# Patient Record
Sex: Female | Born: 1965
Health system: Southern US, Community
[De-identification: ages and names within clinical notes are randomized; demographics above are authoritative.]

## PROBLEM LIST (undated history)

## (undated) DIAGNOSIS — Z8619 Personal history of other infectious and parasitic diseases: Secondary | ICD-10-CM

## (undated) DIAGNOSIS — E785 Hyperlipidemia, unspecified: Secondary | ICD-10-CM

## (undated) DIAGNOSIS — Z1502 Genetic susceptibility to malignant neoplasm of ovary: Secondary | ICD-10-CM

## (undated) DIAGNOSIS — I10 Essential (primary) hypertension: Secondary | ICD-10-CM

## (undated) DIAGNOSIS — N915 Oligomenorrhea, unspecified: Secondary | ICD-10-CM

## (undated) DIAGNOSIS — Z1509 Genetic susceptibility to other malignant neoplasm: Secondary | ICD-10-CM

## (undated) DIAGNOSIS — Z87898 Personal history of other specified conditions: Secondary | ICD-10-CM

## (undated) DIAGNOSIS — Z9221 Personal history of antineoplastic chemotherapy: Secondary | ICD-10-CM

## (undated) DIAGNOSIS — O09529 Supervision of elderly multigravida, unspecified trimester: Secondary | ICD-10-CM

## (undated) DIAGNOSIS — N926 Irregular menstruation, unspecified: Secondary | ICD-10-CM

## (undated) DIAGNOSIS — C801 Malignant (primary) neoplasm, unspecified: Secondary | ICD-10-CM

## (undated) DIAGNOSIS — Z923 Personal history of irradiation: Secondary | ICD-10-CM

## (undated) DIAGNOSIS — Z1501 Genetic susceptibility to malignant neoplasm of breast: Secondary | ICD-10-CM

## (undated) DIAGNOSIS — Z800 Family history of malignant neoplasm of digestive organs: Secondary | ICD-10-CM

## (undated) DIAGNOSIS — Z1589 Genetic susceptibility to other disease: Secondary | ICD-10-CM

## (undated) DIAGNOSIS — Z8742 Personal history of other diseases of the female genital tract: Secondary | ICD-10-CM

## (undated) DIAGNOSIS — E611 Iron deficiency: Secondary | ICD-10-CM

## (undated) DIAGNOSIS — C50919 Malignant neoplasm of unspecified site of unspecified female breast: Secondary | ICD-10-CM

## (undated) DIAGNOSIS — N970 Female infertility associated with anovulation: Secondary | ICD-10-CM

## (undated) HISTORY — PX: OTHER SURGICAL HISTORY: SHX169

## (undated) HISTORY — DX: Essential (primary) hypertension: I10

## (undated) HISTORY — DX: Personal history of other infectious and parasitic diseases: Z86.19

## (undated) HISTORY — DX: Personal history of other diseases of the female genital tract: Z87.42

## (undated) HISTORY — PX: EYE SURGERY: SHX253

## (undated) HISTORY — DX: Genetic susceptibility to other disease: Z15.89

## (undated) HISTORY — DX: Genetic susceptibility to malignant neoplasm of breast: Z15.01

## (undated) HISTORY — DX: Irregular menstruation, unspecified: N92.6

## (undated) HISTORY — DX: Supervision of elderly multigravida, unspecified trimester: O09.529

## (undated) HISTORY — DX: Family history of malignant neoplasm of digestive organs: Z80.0

## (undated) HISTORY — PX: BREAST LUMPECTOMY: SHX2

## (undated) HISTORY — DX: Genetic susceptibility to malignant neoplasm of ovary: Z15.02

## (undated) HISTORY — DX: Personal history of other specified conditions: Z87.898

## (undated) HISTORY — PX: BREAST BIOPSY: SHX20

## (undated) HISTORY — DX: Iron deficiency: E61.1

## (undated) HISTORY — DX: Hyperlipidemia, unspecified: E78.5

## (undated) HISTORY — DX: Genetic susceptibility to other malignant neoplasm: Z15.09

## (undated) HISTORY — DX: Female infertility associated with anovulation: N97.0

## (undated) HISTORY — DX: Oligomenorrhea, unspecified: N91.5

---

## 1998-04-28 ENCOUNTER — Other Ambulatory Visit: Admission: RE | Admit: 1998-04-28 | Discharge: 1998-04-28 | Payer: Self-pay | Admitting: Obstetrics and Gynecology

## 1998-06-11 ENCOUNTER — Other Ambulatory Visit: Admission: RE | Admit: 1998-06-11 | Discharge: 1998-06-11 | Payer: Self-pay | Admitting: Obstetrics and Gynecology

## 1999-12-13 HISTORY — PX: WISDOM TOOTH EXTRACTION: SHX21

## 2001-02-07 DIAGNOSIS — N970 Female infertility associated with anovulation: Secondary | ICD-10-CM

## 2001-02-07 HISTORY — DX: Female infertility associated with anovulation: N97.0

## 2001-08-06 ENCOUNTER — Other Ambulatory Visit: Admission: RE | Admit: 2001-08-06 | Discharge: 2001-08-06 | Payer: Self-pay | Admitting: Obstetrics and Gynecology

## 2002-10-01 ENCOUNTER — Other Ambulatory Visit: Admission: RE | Admit: 2002-10-01 | Discharge: 2002-10-01 | Payer: Self-pay | Admitting: Obstetrics and Gynecology

## 2003-10-08 ENCOUNTER — Other Ambulatory Visit: Admission: RE | Admit: 2003-10-08 | Discharge: 2003-10-08 | Payer: Self-pay | Admitting: Obstetrics and Gynecology

## 2003-10-23 ENCOUNTER — Ambulatory Visit (HOSPITAL_COMMUNITY): Admission: RE | Admit: 2003-10-23 | Discharge: 2003-10-23 | Payer: Self-pay | Admitting: Obstetrics and Gynecology

## 2004-12-22 ENCOUNTER — Other Ambulatory Visit: Admission: RE | Admit: 2004-12-22 | Discharge: 2004-12-22 | Payer: Self-pay | Admitting: Obstetrics and Gynecology

## 2005-01-17 ENCOUNTER — Ambulatory Visit (HOSPITAL_COMMUNITY): Admission: RE | Admit: 2005-01-17 | Discharge: 2005-01-17 | Payer: Self-pay | Admitting: Obstetrics and Gynecology

## 2005-03-28 ENCOUNTER — Inpatient Hospital Stay (HOSPITAL_COMMUNITY): Admission: AD | Admit: 2005-03-28 | Discharge: 2005-03-31 | Payer: Self-pay | Admitting: Obstetrics and Gynecology

## 2005-03-28 DIAGNOSIS — O09529 Supervision of elderly multigravida, unspecified trimester: Secondary | ICD-10-CM

## 2005-03-28 HISTORY — DX: Supervision of elderly multigravida, unspecified trimester: O09.529

## 2006-01-10 ENCOUNTER — Other Ambulatory Visit: Admission: RE | Admit: 2006-01-10 | Discharge: 2006-01-10 | Payer: Self-pay | Admitting: Obstetrics and Gynecology

## 2011-02-08 ENCOUNTER — Other Ambulatory Visit (HOSPITAL_COMMUNITY): Payer: Self-pay | Admitting: Obstetrics and Gynecology

## 2011-02-08 DIAGNOSIS — Z1231 Encounter for screening mammogram for malignant neoplasm of breast: Secondary | ICD-10-CM

## 2011-02-17 ENCOUNTER — Ambulatory Visit (HOSPITAL_COMMUNITY)
Admission: RE | Admit: 2011-02-17 | Discharge: 2011-02-17 | Disposition: A | Discharge status: Home / Self Care | Payer: Managed Care, Other (non HMO) | Source: Ambulatory Visit | Attending: Obstetrics and Gynecology | Admitting: Obstetrics and Gynecology

## 2011-02-17 DIAGNOSIS — Z1231 Encounter for screening mammogram for malignant neoplasm of breast: Secondary | ICD-10-CM | POA: Insufficient documentation

## 2012-02-20 ENCOUNTER — Other Ambulatory Visit: Payer: Self-pay | Admitting: Obstetrics and Gynecology

## 2012-02-20 DIAGNOSIS — Z1231 Encounter for screening mammogram for malignant neoplasm of breast: Secondary | ICD-10-CM

## 2012-03-16 ENCOUNTER — Ambulatory Visit (HOSPITAL_COMMUNITY)
Admission: RE | Admit: 2012-03-16 | Discharge: 2012-03-16 | Disposition: A | Discharge status: Home / Self Care | Payer: Managed Care, Other (non HMO) | Source: Ambulatory Visit | Attending: Obstetrics and Gynecology | Admitting: Obstetrics and Gynecology

## 2012-03-16 DIAGNOSIS — Z1231 Encounter for screening mammogram for malignant neoplasm of breast: Secondary | ICD-10-CM | POA: Insufficient documentation

## 2012-03-19 ENCOUNTER — Other Ambulatory Visit: Payer: Self-pay | Admitting: Obstetrics and Gynecology

## 2012-03-19 DIAGNOSIS — R928 Other abnormal and inconclusive findings on diagnostic imaging of breast: Secondary | ICD-10-CM

## 2012-03-21 ENCOUNTER — Ambulatory Visit
Admission: RE | Admit: 2012-03-21 | Discharge: 2012-03-21 | Disposition: A | Discharge status: Home / Self Care | Payer: Managed Care, Other (non HMO) | Source: Ambulatory Visit | Attending: Obstetrics and Gynecology | Admitting: Obstetrics and Gynecology

## 2012-03-21 DIAGNOSIS — R928 Other abnormal and inconclusive findings on diagnostic imaging of breast: Secondary | ICD-10-CM

## 2012-03-29 ENCOUNTER — Ambulatory Visit: Payer: Self-pay | Admitting: Obstetrics and Gynecology

## 2012-04-26 ENCOUNTER — Telehealth: Payer: Self-pay

## 2012-04-26 MED ORDER — NORETHINDRONE 0.35 MG PO TABS: 1.0000 | ORAL_TABLET | Freq: Every day | ORAL | Status: DC

## 2012-04-26 NOTE — Telephone Encounter (Signed)
Rx fax req received for Jennifer Carroll from [facility]([place]). AEX sched 05/08/12 with vph. Rx faxed to pharm.

## 2012-05-08 ENCOUNTER — Ambulatory Visit (INDEPENDENT_AMBULATORY_CARE_PROVIDER_SITE_OTHER): Payer: Managed Care, Other (non HMO) | Admitting: Obstetrics and Gynecology

## 2012-05-08 ENCOUNTER — Encounter: Payer: Self-pay | Admitting: Obstetrics and Gynecology

## 2012-05-08 VITALS — BP 130/88 | Ht 66.5 in | Wt 234.0 lb

## 2012-05-08 DIAGNOSIS — N926 Irregular menstruation, unspecified: Secondary | ICD-10-CM

## 2012-05-08 DIAGNOSIS — Z124 Encounter for screening for malignant neoplasm of cervix: Secondary | ICD-10-CM

## 2012-05-08 DIAGNOSIS — N949 Unspecified condition associated with female genital organs and menstrual cycle: Secondary | ICD-10-CM

## 2012-05-08 LAB — POCT URINE PREGNANCY: Preg Test, Ur: NEGATIVE

## 2012-05-08 MED ORDER — NORETHINDRONE 0.35 MG PO TABS: 1.0000 | ORAL_TABLET | Freq: Every day | ORAL | Status: DC

## 2012-05-08 MED ORDER — NORETHINDRONE 0.35 MG PO TABS: 1.0000 | ORAL_TABLET | Freq: Every day | ORAL | Status: AC

## 2012-05-08 NOTE — Progress Notes (Signed)
Last Pap: 03/29/2011 WNL: Yes Regular Periods:no Contraception: Micronor Monthly Breast exam:yes Tetanus<77yrs:yes 2008 Nl.Bladder Function:yes Daily BMs:yes Healthy Diet:yes Calcium:no Mammogram:yes 02/20/12-?right breast mass with no malignancy found in left breast. 03-19-12-right breast ultrasound=benign. Exercise:yes 3/4 weekly Seatbelt: yes Abuse at home: no Stressful work:no Sigmoid-colonoscopy: n/a Bone Density: No BMI=36 Subjective:    Jennifer Carroll is a 46 y.o. female, No obstetric history on file., who presents for an annual exam.     History   Social History  . Marital Status: Married    Spouse Name: N/A    Number of Children: N/A  . Years of Education: N/A   Social History Main Topics  . Smoking status: Never Smoker   . Smokeless tobacco: Never Used  . Alcohol Use: None  . Drug Use: None  . Sexually Active: Yes   Other Topics Concern  . None   Social History Narrative  . None    Menstrual cycle:   LMP: Patient's last menstrual period was 02/29/2012.           Cycle: Irregular for many years, no menorrhagia or pain  The following portions of the patient's history were reviewed and updated as appropriate: allergies, current medications, past family history, past medical history, past social history, past surgical history and problem list.  Review of Systems Pertinent items are noted in HPI. Breast:Negative for breast lump,nipple discharge or nipple retraction Gastrointestinal: Negative for abdominal pain, change in bowel habits or rectal bleeding Urinary:negative   Objective:    BP 130/88  Ht 5' 6.5" (1.689 m)  Wt 234 lb (106.142 kg)  BMI 37.20 kg/m2  LMP 02/29/2012    Weight:  Wt Readings from Last 1 Encounters:  05/08/12 234 lb (106.142 kg)          BMI: Body mass index is 37.20 kg/(m^2).  General Appearance: Alert, appropriate appearance for age. No acute distress HEENT: Grossly normal Neck / Thyroid: Supple, no masses, nodes or  enlargement Lungs: clear to auscultation bilaterally Back: No CVA tenderness Breast Exam: No masses or nodes.No dimpling, nipple retraction or discharge. Cardiovascular: Regular rate and rhythm. S1, S2, no murmur Gastrointestinal: Soft, non-tender, no masses or organomegaly Pelvic Exam: Vulva and vagina appear normal. Bimanual exam reveals normal uterus and adnexa. Rectovaginal: normal rectal, no masses Lymphatic Exam: Non-palpable nodes in neck, clavicular, axillary, or inguinal regions Skin: no rash or abnormalities Neurologic: Normal gait and speech, no tremor  Psychiatric: Alert and oriented, appropriate affect.   Wet Prep:not applicable Urinalysis:not applicable UPT: Negative,    Assessment:    Long hx irregular menses, probably secondary to oligoovulation     Plan:    mammogram pap smear with HPV return annually or prn Continue Micronor       Korayma Hagwood PMD

## 2012-05-11 LAB — PAP IG AND HPV HIGH-RISK: HPV DNA High Risk: NOT DETECTED

## 2013-05-01 ENCOUNTER — Other Ambulatory Visit: Payer: Self-pay | Admitting: Obstetrics and Gynecology

## 2013-05-01 DIAGNOSIS — Z1231 Encounter for screening mammogram for malignant neoplasm of breast: Secondary | ICD-10-CM

## 2013-05-10 ENCOUNTER — Ambulatory Visit (HOSPITAL_COMMUNITY)
Admission: RE | Admit: 2013-05-10 | Discharge: 2013-05-10 | Disposition: A | Discharge status: Home / Self Care | Payer: Managed Care, Other (non HMO) | Source: Ambulatory Visit | Attending: Obstetrics and Gynecology | Admitting: Obstetrics and Gynecology

## 2013-05-10 DIAGNOSIS — Z1231 Encounter for screening mammogram for malignant neoplasm of breast: Secondary | ICD-10-CM | POA: Insufficient documentation

## 2014-05-26 ENCOUNTER — Other Ambulatory Visit: Payer: Self-pay | Admitting: Obstetrics and Gynecology

## 2014-05-26 DIAGNOSIS — Z1231 Encounter for screening mammogram for malignant neoplasm of breast: Secondary | ICD-10-CM

## 2014-05-29 ENCOUNTER — Ambulatory Visit (HOSPITAL_COMMUNITY)
Admission: RE | Admit: 2014-05-29 | Discharge: 2014-05-29 | Disposition: A | Discharge status: Home / Self Care | Payer: Managed Care, Other (non HMO) | Source: Ambulatory Visit | Attending: Obstetrics and Gynecology | Admitting: Obstetrics and Gynecology

## 2014-05-29 DIAGNOSIS — Z1231 Encounter for screening mammogram for malignant neoplasm of breast: Secondary | ICD-10-CM | POA: Insufficient documentation

## 2015-05-21 ENCOUNTER — Other Ambulatory Visit (HOSPITAL_COMMUNITY): Payer: Self-pay | Admitting: Obstetrics and Gynecology

## 2015-05-21 DIAGNOSIS — Z1231 Encounter for screening mammogram for malignant neoplasm of breast: Secondary | ICD-10-CM

## 2015-06-01 ENCOUNTER — Ambulatory Visit (HOSPITAL_COMMUNITY)
Admission: RE | Admit: 2015-06-01 | Discharge: 2015-06-01 | Disposition: A | Discharge status: Home / Self Care | Payer: BLUE CROSS/BLUE SHIELD | Source: Ambulatory Visit | Attending: Obstetrics and Gynecology | Admitting: Obstetrics and Gynecology

## 2015-06-01 DIAGNOSIS — Z1231 Encounter for screening mammogram for malignant neoplasm of breast: Secondary | ICD-10-CM | POA: Diagnosis not present

## 2015-06-03 ENCOUNTER — Other Ambulatory Visit: Payer: Self-pay | Admitting: Obstetrics and Gynecology

## 2015-06-03 DIAGNOSIS — R928 Other abnormal and inconclusive findings on diagnostic imaging of breast: Secondary | ICD-10-CM

## 2015-06-08 ENCOUNTER — Ambulatory Visit
Admission: RE | Admit: 2015-06-08 | Discharge: 2015-06-08 | Disposition: A | Discharge status: Home / Self Care | Payer: BLUE CROSS/BLUE SHIELD | Source: Ambulatory Visit | Attending: Obstetrics and Gynecology | Admitting: Obstetrics and Gynecology

## 2015-06-08 DIAGNOSIS — R928 Other abnormal and inconclusive findings on diagnostic imaging of breast: Secondary | ICD-10-CM

## 2016-02-13 ENCOUNTER — Emergency Department (HOSPITAL_COMMUNITY)
Admission: EM | Admit: 2016-02-13 | Discharge: 2016-02-13 | Disposition: A | Discharge status: Home / Self Care | Payer: BLUE CROSS/BLUE SHIELD | Attending: Emergency Medicine | Admitting: Emergency Medicine

## 2016-02-13 ENCOUNTER — Encounter (HOSPITAL_COMMUNITY): Payer: Self-pay | Admitting: Emergency Medicine

## 2016-02-13 DIAGNOSIS — Y998 Other external cause status: Secondary | ICD-10-CM | POA: Insufficient documentation

## 2016-02-13 DIAGNOSIS — Y9389 Activity, other specified: Secondary | ICD-10-CM | POA: Insufficient documentation

## 2016-02-13 DIAGNOSIS — Z8742 Personal history of other diseases of the female genital tract: Secondary | ICD-10-CM | POA: Diagnosis not present

## 2016-02-13 DIAGNOSIS — S8992XA Unspecified injury of left lower leg, initial encounter: Secondary | ICD-10-CM | POA: Diagnosis not present

## 2016-02-13 DIAGNOSIS — Z793 Long term (current) use of hormonal contraceptives: Secondary | ICD-10-CM | POA: Insufficient documentation

## 2016-02-13 DIAGNOSIS — E785 Hyperlipidemia, unspecified: Secondary | ICD-10-CM | POA: Diagnosis not present

## 2016-02-13 DIAGNOSIS — Z862 Personal history of diseases of the blood and blood-forming organs and certain disorders involving the immune mechanism: Secondary | ICD-10-CM | POA: Insufficient documentation

## 2016-02-13 DIAGNOSIS — Z8619 Personal history of other infectious and parasitic diseases: Secondary | ICD-10-CM | POA: Insufficient documentation

## 2016-02-13 DIAGNOSIS — Y92410 Unspecified street and highway as the place of occurrence of the external cause: Secondary | ICD-10-CM | POA: Diagnosis not present

## 2016-02-13 MED ORDER — IBUPROFEN 400 MG PO TABS
600.0000 mg | ORAL_TABLET | Freq: Once | ORAL | Status: AC
  Administered 2016-02-13: 600 mg via ORAL
  Filled 2016-02-13: qty 1

## 2016-02-13 MED ORDER — METHOCARBAMOL 500 MG PO TABS
1000.0000 mg | ORAL_TABLET | Freq: Once | ORAL | Status: AC
  Administered 2016-02-13: 1000 mg via ORAL
  Filled 2016-02-13: qty 2

## 2016-02-13 MED ORDER — METHOCARBAMOL 500 MG PO TABS: 500.0000 mg | ORAL_TABLET | Freq: Two times a day (BID) | ORAL | Status: DC
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
## 2016-02-13 NOTE — ED Notes (Signed)
Pt ambulates independently and with steady gait at time of discharge. Discharge instructions and follow up information reviewed with patient. No other questions or concerns voiced at this time. RX x 1 given. 
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
## 2017-12-25 DIAGNOSIS — E669 Obesity, unspecified: Secondary | ICD-10-CM | POA: Diagnosis not present

## 2017-12-25 DIAGNOSIS — I10 Essential (primary) hypertension: Secondary | ICD-10-CM | POA: Diagnosis not present

## 2017-12-25 DIAGNOSIS — E7800 Pure hypercholesterolemia, unspecified: Secondary | ICD-10-CM | POA: Diagnosis not present

## 2018-06-01 DIAGNOSIS — E669 Obesity, unspecified: Secondary | ICD-10-CM | POA: Diagnosis not present

## 2018-06-01 DIAGNOSIS — E7800 Pure hypercholesterolemia, unspecified: Secondary | ICD-10-CM | POA: Diagnosis not present

## 2018-06-01 DIAGNOSIS — I10 Essential (primary) hypertension: Secondary | ICD-10-CM | POA: Diagnosis not present

## 2018-06-01 DIAGNOSIS — Z0000 Encounter for general adult medical examination without abnormal findings: Secondary | ICD-10-CM | POA: Diagnosis not present

## 2018-07-02 DIAGNOSIS — Z3040 Encounter for surveillance of contraceptives, unspecified: Secondary | ICD-10-CM | POA: Diagnosis not present

## 2018-07-02 DIAGNOSIS — Z01419 Encounter for gynecological examination (general) (routine) without abnormal findings: Secondary | ICD-10-CM | POA: Diagnosis not present

## 2018-07-02 DIAGNOSIS — Z6837 Body mass index (BMI) 37.0-37.9, adult: Secondary | ICD-10-CM | POA: Diagnosis not present

## 2018-07-02 DIAGNOSIS — I10 Essential (primary) hypertension: Secondary | ICD-10-CM | POA: Diagnosis not present

## 2018-07-02 DIAGNOSIS — Z1231 Encounter for screening mammogram for malignant neoplasm of breast: Secondary | ICD-10-CM | POA: Diagnosis not present

## 2018-07-05 ENCOUNTER — Other Ambulatory Visit: Payer: Self-pay | Admitting: Obstetrics and Gynecology

## 2018-07-05 DIAGNOSIS — N6001 Solitary cyst of right breast: Secondary | ICD-10-CM

## 2018-07-11 ENCOUNTER — Ambulatory Visit
Admission: RE | Admit: 2018-07-11 | Discharge: 2018-07-11 | Disposition: A | Discharge status: Home / Self Care | Payer: BLUE CROSS/BLUE SHIELD | Source: Ambulatory Visit | Attending: Obstetrics and Gynecology | Admitting: Obstetrics and Gynecology

## 2018-07-11 ENCOUNTER — Other Ambulatory Visit: Payer: Self-pay | Admitting: Obstetrics and Gynecology

## 2018-07-11 DIAGNOSIS — N6310 Unspecified lump in the right breast, unspecified quadrant: Secondary | ICD-10-CM | POA: Diagnosis not present

## 2018-07-11 DIAGNOSIS — R928 Other abnormal and inconclusive findings on diagnostic imaging of breast: Secondary | ICD-10-CM | POA: Diagnosis not present

## 2018-07-11 DIAGNOSIS — N6001 Solitary cyst of right breast: Secondary | ICD-10-CM

## 2018-07-13 ENCOUNTER — Other Ambulatory Visit: Payer: Self-pay | Admitting: Obstetrics and Gynecology

## 2018-07-13 ENCOUNTER — Ambulatory Visit
Admission: RE | Admit: 2018-07-13 | Discharge: 2018-07-13 | Disposition: A | Discharge status: Home / Self Care | Payer: BLUE CROSS/BLUE SHIELD | Source: Ambulatory Visit | Attending: Obstetrics and Gynecology | Admitting: Obstetrics and Gynecology

## 2018-07-13 DIAGNOSIS — C50411 Malignant neoplasm of upper-outer quadrant of right female breast: Secondary | ICD-10-CM | POA: Diagnosis not present

## 2018-07-13 DIAGNOSIS — N6310 Unspecified lump in the right breast, unspecified quadrant: Secondary | ICD-10-CM

## 2018-07-13 DIAGNOSIS — N6311 Unspecified lump in the right breast, upper outer quadrant: Secondary | ICD-10-CM | POA: Diagnosis not present

## 2018-07-16 ENCOUNTER — Ambulatory Visit
Admission: RE | Admit: 2018-07-16 | Discharge: 2018-07-16 | Disposition: A | Discharge status: Home / Self Care | Payer: BLUE CROSS/BLUE SHIELD | Source: Ambulatory Visit | Attending: Obstetrics and Gynecology | Admitting: Obstetrics and Gynecology

## 2018-07-16 DIAGNOSIS — N6310 Unspecified lump in the right breast, unspecified quadrant: Secondary | ICD-10-CM

## 2018-07-17 ENCOUNTER — Telehealth: Payer: Self-pay | Admitting: Hematology and Oncology

## 2018-07-17 NOTE — Telephone Encounter (Signed)
Spoke to patient to confirm afternoon [facility] appointment for 8/14, packet and bag already given to patient

## 2018-07-18 ENCOUNTER — Encounter: Payer: Self-pay | Admitting: *Deleted

## 2018-07-18 DIAGNOSIS — Z171 Estrogen receptor negative status [ER-]: Principal | ICD-10-CM

## 2018-07-18 DIAGNOSIS — C50411 Malignant neoplasm of upper-outer quadrant of right female breast: Secondary | ICD-10-CM

## 2018-07-25 ENCOUNTER — Inpatient Hospital Stay: Payer: BLUE CROSS/BLUE SHIELD

## 2018-07-25 ENCOUNTER — Other Ambulatory Visit: Payer: Self-pay | Admitting: General Surgery

## 2018-07-25 ENCOUNTER — Encounter: Payer: Self-pay | Admitting: Physical Therapy

## 2018-07-25 ENCOUNTER — Ambulatory Visit: Payer: BLUE CROSS/BLUE SHIELD | Attending: General Surgery | Admitting: Physical Therapy

## 2018-07-25 ENCOUNTER — Inpatient Hospital Stay: Payer: BLUE CROSS/BLUE SHIELD | Attending: Hematology and Oncology | Admitting: Hematology and Oncology

## 2018-07-25 ENCOUNTER — Ambulatory Visit
Admission: RE | Admit: 2018-07-25 | Discharge: 2018-07-25 | Disposition: A | Discharge status: Home / Self Care | Payer: BLUE CROSS/BLUE SHIELD | Source: Ambulatory Visit | Attending: Radiation Oncology | Admitting: Radiation Oncology

## 2018-07-25 ENCOUNTER — Other Ambulatory Visit: Payer: Self-pay

## 2018-07-25 ENCOUNTER — Encounter: Payer: Self-pay | Admitting: Hematology and Oncology

## 2018-07-25 DIAGNOSIS — E669 Obesity, unspecified: Secondary | ICD-10-CM | POA: Diagnosis not present

## 2018-07-25 DIAGNOSIS — E785 Hyperlipidemia, unspecified: Secondary | ICD-10-CM | POA: Insufficient documentation

## 2018-07-25 DIAGNOSIS — Z171 Estrogen receptor negative status [ER-]: Principal | ICD-10-CM

## 2018-07-25 DIAGNOSIS — C50411 Malignant neoplasm of upper-outer quadrant of right female breast: Secondary | ICD-10-CM | POA: Insufficient documentation

## 2018-07-25 DIAGNOSIS — R293 Abnormal posture: Secondary | ICD-10-CM | POA: Diagnosis not present

## 2018-07-25 DIAGNOSIS — Z79899 Other long term (current) drug therapy: Secondary | ICD-10-CM | POA: Diagnosis not present

## 2018-07-25 DIAGNOSIS — N915 Oligomenorrhea, unspecified: Secondary | ICD-10-CM | POA: Insufficient documentation

## 2018-07-25 DIAGNOSIS — I10 Essential (primary) hypertension: Secondary | ICD-10-CM | POA: Insufficient documentation

## 2018-07-25 LAB — CBC WITH DIFFERENTIAL (CANCER CENTER ONLY)
BASOS ABS: 0 10*3/uL (ref 0.0–0.1)
Basophils Relative: 1 %
EOS PCT: 2 %
Eosinophils Absolute: 0.1 10*3/uL (ref 0.0–0.5)
HEMATOCRIT: 40.6 % (ref 34.8–46.6)
Hemoglobin: 13.4 g/dL (ref 11.6–15.9)
LYMPHS PCT: 35 %
Lymphs Abs: 1.5 10*3/uL (ref 0.9–3.3)
MCH: 29.5 pg (ref 25.1–34.0)
MCHC: 32.9 g/dL (ref 31.5–36.0)
MCV: 89.6 fL (ref 79.5–101.0)
MONOS PCT: 7 %
Monocytes Absolute: 0.3 10*3/uL (ref 0.1–0.9)
Neutro Abs: 2.3 10*3/uL (ref 1.5–6.5)
Neutrophils Relative %: 55 %
PLATELETS: 207 10*3/uL (ref 145–400)
RBC: 4.53 MIL/uL (ref 3.70–5.45)
RDW: 14.9 % — ABNORMAL HIGH (ref 11.2–14.5)
WBC Count: 4.2 10*3/uL (ref 3.9–10.3)

## 2018-07-25 LAB — CMP (CANCER CENTER ONLY)
ALT: 16 U/L (ref 0–44)
AST: 14 U/L — AB (ref 15–41)
Albumin: 3.9 g/dL (ref 3.5–5.0)
Alkaline Phosphatase: 99 U/L (ref 38–126)
Anion gap: 11 (ref 5–15)
BILIRUBIN TOTAL: 0.3 mg/dL (ref 0.3–1.2)
BUN: 15 mg/dL (ref 6–20)
CHLORIDE: 107 mmol/L (ref 98–111)
CO2: 24 mmol/L (ref 22–32)
Calcium: 9.1 mg/dL (ref 8.9–10.3)
Creatinine: 1.07 mg/dL — ABNORMAL HIGH (ref 0.44–1.00)
GFR, EST NON AFRICAN AMERICAN: 59 mL/min — AB (ref >60)
Glucose, Bld: 112 mg/dL — ABNORMAL HIGH (ref 70–99)
POTASSIUM: 4.3 mmol/L (ref 3.5–5.1)
Sodium: 142 mmol/L (ref 135–145)
TOTAL PROTEIN: 7.5 g/dL (ref 6.5–8.1)

## 2018-07-25 NOTE — Assessment & Plan Note (Signed)
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 After reviewing the pathology in detail, we proceeded to discuss the different treatment options between surgery, radiation, chemotherapy, antiestrogen therapies.  Recommendation: 1.  Breast conserving surgery with sentinel lymph node biopsy 2. adjuvant chemotherapy with dose dense Adriamycin and Cytoxan x4 followed by Taxol weekly x12 3.  Adjuvant radiation therapy  Genetics consultation Port placement Echocardiogram Chemo class  Follow-up after surgery to discuss treatment plan.  Chemo will start 1 month after surgery.

## 2018-07-25 NOTE — Progress Notes (Signed)
Radiation Oncology         [phone]) [phone] ________________________________  Name: Jennifer Carroll        MRN: [id number]  Date of Service: 07/25/2018 DOB: 05/12/1966  SM:OLMB, Nathen May, MD  Fanny Skates, MD     REFERRING PHYSICIAN: Fanny Skates, MD   DIAGNOSIS: The encounter diagnosis was Malignant neoplasm of upper-outer quadrant of right breast in female, estrogen receptor negative ([place]).   HISTORY OF PRESENT ILLNESS: Jennifer Carroll is a 52 y.o. female seen in the multidisciplinary breast clinic for a new diagnosis of right breast cancer. The patient was noted to have a screening detected abnormality in the right breast and on ultrasound in the upper outer quadrant, at 10:00, there was a 1.4 x 1 x 1 cm mass. Her axilla was negative for adenopathy. She underwent a biopsy of the lesion on 07/13/18 revealing a grade 2, invasive ductal carcinoma, triple negative. She comes today to discuss treatment recommendations for her cancer.    PREVIOUS RADIATION THERAPY: No   PAST MEDICAL HISTORY:  Past Medical History:  Diagnosis Date  . AMA (advanced maternal age) multigravida 35+ 03/28/05   with no amniocentesis  . 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 H/O infertility   . H/O menorrhagia   . History of chicken pox   . History of measles, mumps, or rubella   . Hyperlipidemia   . Low iron   . Menses, irregular   . Oligomenorrhea        PAST SURGICAL HISTORY: Past Surgical History:  Procedure Laterality Date  . EYE SURGERY     left eye at age 47  . head surgery     to cover hole in head at age of  41 yrs young  . WISDOM TOOTH EXTRACTION  2001     FAMILY HISTORY:  Family History  Problem Relation Age of Onset  . Hypertension Mother   . Heart disease Father   . Hypertension Brother      SOCIAL HISTORY:  reports that she has never smoked. She has never used smokeless tobacco.   ALLERGIES: Patient has no known allergies.   MEDICATIONS:  Current Outpatient Medications    Medication Sig Dispense Refill  . methocarbamol (ROBAXIN) 500 MG tablet Take 1 tablet (500 mg total) by mouth 2 (two) times daily. 20 tablet 0  . norethindrone (CAMILA) 0.35 MG tablet Take 1 tablet (0.35 mg total) by mouth daily. 3 Package 3  . simvastatin (ZOCOR) 20 MG tablet Take 20 mg by mouth every evening.     No current facility-administered medications for this encounter.      REVIEW OF SYSTEMS: On review of systems, the patient reports that she is doing well overall. She denies any chest pain, shortness of breath, cough, fevers, chills, night sweats, unintended weight changes. She denies any bowel or bladder disturbances, and denies abdominal pain, nausea or vomiting. She denies any new musculoskeletal or joint aches or pains. A complete review of systems is obtained and is otherwise negative.     PHYSICAL EXAM:  Wt Readings from Last 3 Encounters:  07/25/18 237 lb 4.8 oz (107.6 kg)  05/08/12 234 lb (106.1 kg)   Temp Readings from Last 3 Encounters:  07/25/18 98.5 F (36.9 C) (Oral)  02/13/16 98.6 F (37 C) (Oral)   BP Readings from Last 3 Encounters:  07/25/18 (!) 145/94  02/13/16 155/95  05/08/12 130/88   Pulse Readings from Last 3 Encounters:  07/25/18 93  02/13/16 84  In general this is a well appearing caucasian female in no acute distress. She is alert and oriented x4 and appropriate throughout the examination. HEENT reveals that the patient is normocephalic, atraumatic. EOMs are intact.  Skin is intact without any evidence of gross lesions. Cardiopulmonary assessment is negative for acute distress and she exhibits normal effort. Breast exam is deferred.  ECOG = 0  0 - Asymptomatic (Fully active, able to carry on all predisease activities without restriction)  1 - Symptomatic but completely ambulatory (Restricted in physically strenuous activity but ambulatory and able to carry out work of a light or sedentary nature. For example, light housework, office  work)  2 - Symptomatic, <50% in bed during the day (Ambulatory and capable of all self care but unable to carry out any work activities. Up and about more than 50% of waking hours)  3 - Symptomatic, >50% in bed, but not bedbound (Capable of only limited self-care, confined to bed or chair 50% or more of waking hours)  4 - Bedbound (Completely disabled. Cannot carry on any self-care. Totally confined to bed or chair)  5 - Death   Eustace Pen MM, Creech RH, Tormey DC, et al. [id number]). "Toxicity and response criteria of the [facility]". [place] Oncol. 5 (6): 649-55    LABORATORY DATA:  Lab Results  Component Value Date   WBC 4.2 07/25/2018   HGB 13.4 07/25/2018   HCT 40.6 07/25/2018   MCV 89.6 07/25/2018   PLT 207 07/25/2018   Lab Results  Component Value Date   NA 142 07/25/2018   K 4.3 07/25/2018   CL 107 07/25/2018   CO2 24 07/25/2018   Lab Results  Component Value Date   ALT 16 07/25/2018   AST 14 (L) 07/25/2018   ALKPHOS 99 07/25/2018   BILITOT 0.3 07/25/2018      RADIOGRAPHY: US Breast Ltd Uni Right Inc Axilla  Result Date: 07/11/2018 CLINICAL DATA:  Recall from screening mammography with tomosynthesis, possible developing asymmetry in the UPPER OUTER RIGHT breast at far POSTERIOR depth. EXAM: DIGITAL DIAGNOSTIC RIGHT MAMMOGRAM WITH TOMO ULTRASOUND RIGHT BREAST COMPARISON:  Mammography 07/02/2018, 06/12/2017 and earlier from [facility]. ACR Breast Density Category b: There are scattered areas of fibroglandular density. FINDINGS: The focal asymmetry in the UPPER OUTER QUADRANT of the RIGHT breast at POSTERIOR depth persists on the spot compression views, and there is evidence of associated architectural distortion, especially on the CC tomosynthesis images. There are no associated suspicious calcifications. On physical exam, the tissues of the UPPER OUTER QUADRANT of the RIGHT breast have a "lumpy bumpy" texture, though I do not palpate a  discrete mass. Targeted RIGHT breast ultrasound is performed, showing a hypoechoic antiparallel mass with indistinct and irregular margins at the 10 o'clock position approximately 20 cm from the nipple measuring approximately 1.0 x 1.0 x 1.4 cm, demonstrating slight posterior acoustic shadowing and no internal power Doppler flow, corresponding to the screening mammographic finding. Sonographic evaluation of the RIGHT axilla demonstrates no pathologic lymphadenopathy. IMPRESSION: 1. Highly suspicious approximate 1.4 cm mass involving the UPPER OUTER QUADRANT of the RIGHT breast at POSTERIOR depth at the 10 o'clock position approximately 20 cm from the nipple. 2. No pathologic RIGHT axillary lymphadenopathy. RECOMMENDATION: Ultrasound-guided core needle biopsy of the RIGHT breast mass. The ultrasound core needle biopsy procedure was discussed with patient and her questions were answered. She has agreed to proceed and the biopsy has been scheduled for this Friday, August 2 at 3:45  p.m. I have discussed the findings and recommendations with the patient. Results were also provided in writing at the conclusion of the visit. BI-RADS CATEGORY  5: Highly suggestive of malignancy. Electronically Signed   By: Evangeline Dakin M.D.   On: 07/11/2018 13:49   Mm Diag Breast Tomo Uni Right  Result Date: 07/11/2018 CLINICAL DATA:  Recall from screening mammography with tomosynthesis, possible developing asymmetry in the UPPER OUTER RIGHT breast at far POSTERIOR depth. EXAM: DIGITAL DIAGNOSTIC RIGHT MAMMOGRAM WITH TOMO ULTRASOUND RIGHT BREAST COMPARISON:  Mammography 07/02/2018, 06/12/2017 and earlier from [facility]. ACR Breast Density Category b: There are scattered areas of fibroglandular density. FINDINGS: The focal asymmetry in the UPPER OUTER QUADRANT of the RIGHT breast at POSTERIOR depth persists on the spot compression views, and there is evidence of associated architectural distortion, especially on the CC  tomosynthesis images. There are no associated suspicious calcifications. On physical exam, the tissues of the UPPER OUTER QUADRANT of the RIGHT breast have a "lumpy bumpy" texture, though I do not palpate a discrete mass. Targeted RIGHT breast ultrasound is performed, showing a hypoechoic antiparallel mass with indistinct and irregular margins at the 10 o'clock position approximately 20 cm from the nipple measuring approximately 1.0 x 1.0 x 1.4 cm, demonstrating slight posterior acoustic shadowing and no internal power Doppler flow, corresponding to the screening mammographic finding. Sonographic evaluation of the RIGHT axilla demonstrates no pathologic lymphadenopathy. IMPRESSION: 1. Highly suspicious approximate 1.4 cm mass involving the UPPER OUTER QUADRANT of the RIGHT breast at POSTERIOR depth at the 10 o'clock position approximately 20 cm from the nipple. 2. No pathologic RIGHT axillary lymphadenopathy. RECOMMENDATION: Ultrasound-guided core needle biopsy of the RIGHT breast mass. The ultrasound core needle biopsy procedure was discussed with patient and her questions were answered. She has agreed to proceed and the biopsy has been scheduled for this Friday, August 2 at 3:45 p.m. I have discussed the findings and recommendations with the patient. Results were also provided in writing at the conclusion of the visit. BI-RADS CATEGORY  5: Highly suggestive of malignancy. Electronically Signed   By: Evangeline Dakin M.D.   On: 07/11/2018 13:49   Mm Clip Placement Right  Result Date: 07/13/2018 CLINICAL DATA:  Status post ultrasound-guided core biopsy of the right breast. EXAM: DIAGNOSTIC RIGHT MAMMOGRAM POST ULTRASOUND BIOPSY COMPARISON:  Previous exam(s). FINDINGS: Mammographic images were obtained following ultrasound guided biopsy of the right breast. Mammographic images show there is a ribbon shaped clip in appropriate position in the upper-outer quadrant of the right breast. IMPRESSION: Status post  ultrasound-guided core biopsy of the right breast with pathology pending. Final Assessment: Post Procedure Mammograms for Marker Placement Electronically Signed   By: Lillia Mountain M.D.   On: 07/13/2018 16:29   [place] Rt Breast Bx W Loc Dev 1st Lesion Img Bx Spec US Guide  Addendum Date: 07/18/2018   ADDENDUM REPORT: 07/17/2018 11:18 ADDENDUM: Pathology revealed GRADE II INVASIVE MAMMARY CARCINOMA of RIGHT breast, 10 o'clock. This was found to be concordant by Dr. Lillia Mountain. Pathology results were discussed in person with the patient on July 16, 2018. The patient reported doing well after the biopsy with tenderness at the site. Post biopsy instructions and care were reviewed and questions were answered. The patient was encouraged to call [facility] for any additional concerns. The patient was referred to [facility] at [facility] on July 25, 2018. I have spoken to the office  of Dr [name], [place], [place], [place], and the office of Lennie Odor, PA-C, [place] [facility] [place], [place], [place], to inform of biopsy results. Pathology reports have been faxed to the office of Dr [name] and [facility] [place]. Pathology results reported by Roselind Messier, RN on 07/17/2018. Electronically Signed   By: Lillia Mountain M.D.   On: 07/17/2018 11:18   Result Date: 07/18/2018 CLINICAL DATA:  Right breast mass. EXAM: ULTRASOUND GUIDED RIGHT BREAST CORE NEEDLE BIOPSY COMPARISON:  Previous exam(s). FINDINGS: I met with the patient and we discussed the procedure of ultrasound-guided biopsy, including benefits and alternatives. We discussed the high likelihood of a successful procedure. We discussed the risks of the procedure, including infection, bleeding, tissue injury, clip migration, and inadequate sampling. Informed written consent was given. The usual time-out protocol was performed immediately prior to the  procedure. Lesion quadrant: Upper outer quadrant. Using sterile technique and 1% lidocaine and 1% lidocaine with epinephrine as local anesthetic, under direct ultrasound visualization, a 14 gauge spring-loaded device was used to perform biopsy of a mass in the 10 o'clock region of the right breast using an inferior to superior approach. At the conclusion of the procedure a ribbon shaped tissue marker clip was deployed into the biopsy cavity. Follow up 2 view mammogram was performed and dictated separately. IMPRESSION: Ultrasound guided biopsy of the right breast. No apparent complications. Electronically Signed: By: Lillia Mountain M.D. On: 07/13/2018 16:18       IMPRESSION/PLAN: 1. Stage IB, [id number] grade 2, triple negataive invasive ductal carcinoma of the right breast. Dr. [name] discusses the pathology findings and reviews the nature of invasive breast disease. The consensus from the breast conference includes breast conservation with lumpectomy with sentinel node biopsy and port a cath placement.The patient will need chemotherapy following surgery and will benefit from adjuvant radiotherapy. She will meet with genetics and if she has a predisposition to malignancy, this could lead to consideration of mastectomy. There are several scenarios where postmastectomy radiotherapy may benefit the patient, though we will follow up with her surgical decision making and pathology when available.  We discussed the risks, benefits, short, and long term effects of radiotherapy, and the patient is interested in proceeding. Dr. [name] discusses the delivery and logistics of radiotherapy and anticipates a course of 6 1/2 weeks of radiotherapy. We will see her back about 2-3 weeks after completion of chemotherapy to discuss adjuvant treatment.  2. Possible genetic predisposition to malignancy. The patient is a candidate for genetic testing given her personal history of triple negative disease. She  was offered referral and will  be set up for this urgently.  The above documentation reflects my direct findings during this shared patient visit. Please see the separate note by Dr. [name] on this date for the remainder of the patient's plan of care.    Carola Rhine, PAC

## 2018-07-25 NOTE — Therapy (Addendum)
[place], [place], [address] Phone: [phone]   Fax:  [phone]  Physical Therapy Evaluation  Patient Details  Name: Jennifer Carroll MRN: [id number] Date of Birth: 29-Dec-1965 Referring Provider: Dr. Fanny Skates   Encounter Date: 07/25/2018  PT End of Session - 07/25/18 1448    Visit Number  1    Number of Visits  2    Date for PT Re-Evaluation  09/19/18    PT Start Time  7353    PT Stop Time  1435    PT Time Calculation (min)  28 min    Activity Tolerance  Patient tolerated treatment well    Behavior During Therapy  [facility] for tasks assessed/performed       Past Medical History:  Diagnosis Date  . AMA (advanced maternal age) multigravida 35+ 03/28/05   with no amniocentesis  . 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 H/O infertility   . H/O menorrhagia   . History of chicken pox   . History of measles, mumps, or rubella   . Hyperlipidemia   . Hypertension   . Low iron   . Menses, irregular   . Oligomenorrhea     Past Surgical History:  Procedure Laterality Date  . EYE SURGERY     left eye at age 52  . head surgery     to cover hole in head at age of  52 yrs young  . WISDOM TOOTH EXTRACTION  2001    There were no vitals filed for this visit.   Subjective Assessment - 07/25/18 1435    Subjective  Patient reports she is here today to be seen by her medical team for her newly diagnosed right breast cancer.    Patient is accompained by:  Family member    Pertinent History  Patient was diagnosed on 07/02/18 with right triple negative grade II invasive ductal carcinoma breast cancer. It measures 1.4 cm and is located in the upper outer quadrant. The Ki67 is 50%.    Patient Stated Goals  Reduce lymphedema risk and learn post op shoulder ROM HEP    Currently in Pain?  No/denies         [facility] PT Assessment - 07/25/18 0001      Assessment   Medical Diagnosis  Right breast cancer    Referring Provider  Dr. Fanny Skates    Onset Date/Surgical Date  07/02/18    Hand Dominance  Right    Prior Therapy  none      Precautions   Precautions  Other (comment)    Precaution Comments  active cancer      Restrictions   Weight Bearing Restrictions  No      Balance Screen   Has the patient fallen in the past 6 months  No    Has the patient had a decrease in activity level because of a fear of falling?   No    Is the patient reluctant to leave their home because of a fear of falling?   No      Home Environment   Living Environment  Private residence    Living Arrangements  Spouse/significant other;Children   Husband, 75 and 68 y.o. 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 [employer]  Leisure  She walks once a week for 30 min      Cognition   Overall Cognitive Status  Within Functional Limits for tasks assessed      Posture/Postural Control   Posture/Postural Control  Postural limitations    Postural Limitations  Rounded Shoulders;Forward head      ROM / Strength   AROM / PROM / Strength  AROM;Strength      AROM   AROM Assessment Site  Shoulder;Cervical    Right/Left Shoulder  Right;Left    Right Shoulder Extension  54 Degrees    Right Shoulder Flexion  148 Degrees    Right Shoulder ABduction  149 Degrees    Right Shoulder Internal Rotation  82 Degrees    Right Shoulder External Rotation  70 Degrees    Left Shoulder Extension  58 Degrees    Left Shoulder Flexion  143 Degrees    Left Shoulder ABduction  145 Degrees    Left Shoulder Internal Rotation  82 Degrees    Left Shoulder External Rotation  80 Degrees    Cervical Flexion  WNL    Cervical Extension  WNL    Cervical - Right Side Bend  WNL    Cervical - Left Side Bend  WNL    Cervical - Right Rotation  WNL    Cervical - Left Rotation  WNL       Strength   Overall Strength  Within functional limits for tasks performed        LYMPHEDEMA/ONCOLOGY QUESTIONNAIRE - 07/25/18 1444      Type   Cancer Type  Right breast cancer      Lymphedema Assessments   Lymphedema Assessments  Upper extremities      Right Upper Extremity Lymphedema   10 cm Proximal to Olecranon Process  32.8 cm    Olecranon Process  27.5 cm    10 cm Proximal to Ulnar Styloid Process  24.8 cm    Just Proximal to Ulnar Styloid Process  18 cm    Across Hand at [employer]  20 cm    At [place] of 2nd Digit  6.6 cm      Left Upper Extremity Lymphedema   10 cm Proximal to Olecranon Process  33.6 cm    Olecranon Process  27.5 cm    10 cm Proximal to Ulnar Styloid Process  25 cm    Just Proximal to Ulnar Styloid Process  18.2 cm    Across Hand at [employer]  20.7 cm    At [place] of 2nd Digit  6.5 cm             Objective measurements completed on examination: See above findings.    Patient was instructed today in a home exercise program today for post op shoulder range of motion. These included active assist shoulder flexion in sitting, scapular retraction, wall walking with shoulder abduction, and hands behind head external rotation.  She was encouraged to do these twice a day, holding 3 seconds and repeating 5 times when permitted by her physician.       PT Education - 07/25/18 1447    Education Details  Lymphedema risk reduction and post op shoulder ROM HEP    Person(s) Educated  Patient;Spouse    Methods  Explanation;Demonstration;Handout    Comprehension  Verbalized understanding;Returned demonstration          PT Long Term Goals - 07/25/18 1516      PT LONG TERM GOAL #1  Title  Patient will demonstrate she has returned to baseline post operatively related ot shoulder ROM and function.    Time  [facility] Goals - 07/25/18 1454      Patient will be able to verbalize understanding of  pertinent lymphedema risk reduction practices relevant to her diagnosis specifically related to skin care.   Time  1    Period  Days    Status  Achieved      Patient will be able to return demonstrate and/or verbalize understanding of the post-op home exercise program related to regaining shoulder range of motion.   Time  1    Period  Days    Status  Achieved      Patient will be able to verbalize understanding of the importance of attending the postoperative After Breast Cancer Class for further lymphedema risk reduction education and therapeutic exercise.   Time  1    Period  Days    Status  Achieved            Plan - 07/25/18 1449    Clinical Impression Statement  Patient was diagnosed on 07/02/18 with right triple negative grade II invasive ductal carcinoma breast cancer. It measures 1.4 cm and is located in the upper outer quadrant. The Ki67 is 50%. Her multidisciplinary medical team met prior to her assessments to determine a recommended treatment plan. She is planning to have a right lumpectomy and sentinel node biopsy followed by chemotherapy and radiation. She will benefit from a post op reassessment to determine PT needs.    History and Personal Factors relevant to plan of care:  None    Clinical Presentation  Stable    Clinical Decision Making  Low    Rehab Potential  Excellent    Clinical Impairments Affecting Rehab Potential  None    PT Frequency  --   Eval and 1 f/u visit   PT Treatment/Interventions  ADLs/Self Care Home Management;Patient/family education;Therapeutic exercise    PT Next Visit Plan  Will reassess 3-4 weeks post op    PT Home Exercise Plan  Post op shoulder ROM HEP    Consulted and Agree with Plan of Care  Patient;Family member/caregiver    Family Member Consulted  Spouse, sister-in-law, daughter       Patient will benefit from skilled therapeutic intervention in order to improve the following deficits and impairments:  Decreased range of motion,  Postural dysfunction, Decreased knowledge of precautions, Pain, Impaired UE functional use  Visit Diagnosis: Malignant neoplasm of upper-outer quadrant of right breast in female, estrogen receptor negative ([place]) - Plan: PT plan of care cert/re-cert  Abnormal posture - Plan: PT plan of care cert/re-cert   Patient will follow up at outpatient cancer rehab 3-4 weeks following surgery.  If the patient requires physical therapy at that time, a specific plan will be dictated and sent to the referring physician for approval. The patient was educated today on appropriate basic range of motion exercises to begin post operatively and the importance of attending the After Breast Cancer class following surgery.  Patient was educated today on lymphedema risk reduction practices as it pertains to recommendations that will benefit the patient immediately following surgery.  She verbalized good understanding.      Problem List Patient Active Problem List   Diagnosis Date Noted  . Malignant neoplasm of upper-outer quadrant of right breast in female,  estrogen receptor negative ([place]) 07/18/2018  . Irregular menses 05/08/2012   Annia Friendly, PT 07/25/18 3:17 PM  [place], [place], [address] Phone: [phone]   Fax:  [phone]  Name: Melonie Germani MRN: [id number] Date of Birth: 03-11-1966

## 2018-07-25 NOTE — Patient Instructions (Signed)

## 2018-07-25 NOTE — Progress Notes (Signed)
[facility] NOTE  Patient Care Team: [name], MD as PCP - General (Family Medicine) Fanny Skates, MD as Consulting Physician (General Surgery) Nicholas Lose, MD as Consulting Physician (Hematology and Oncology) Kyung Rudd, MD as Consulting Physician (Radiation Oncology)  CHIEF COMPLAINTS/PURPOSE OF CONSULTATION:  Newly diagnosed breast cancer  HISTORY OF PRESENTING ILLNESS:  Jennifer Carroll 52 y.o. female is here because of recent diagnosis of right breast cancer.  Patient had a routine screening mammogram that detected right breast asymmetry in the upper outer quadrant 10 o'clock position 1.4 cm in size axilla was negative.  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  She was presented this morning to the multidisciplinary tumor board and she is here today accompanied by her family to discuss her treatment plan.  I reviewed her records extensively and collaborated the history with the patient.  SUMMARY OF ONCOLOGIC HISTORY:   Malignant neoplasm of upper-outer quadrant of right breast in female, estrogen receptor negative ([place])   07/13/2018 Initial Diagnosis    Screening detected right breast asymmetry UOQ 10 o'clock position 1.4 cm, axilla negative, biopsy revealed grade 2 IDC triple negative with a Ki-67 of 50% T1c N0 stage Ib AJCC 8    MEDICAL HISTORY:  Past Medical History:  Diagnosis Date  . AMA (advanced maternal age) multigravida 35+ 03/28/05   with no amniocentesis  . 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 H/O infertility   . H/O menorrhagia   . History of chicken pox   . History of measles, mumps, or rubella   . Hyperlipidemia   . Hypertension   . Low iron   . Menses, irregular   . Oligomenorrhea     SURGICAL HISTORY: Past Surgical History:  Procedure Laterality Date  . EYE SURGERY     left eye at age 73  . head surgery     to cover hole in head at age of  17 yrs young  .  WISDOM TOOTH EXTRACTION  2001    SOCIAL HISTORY: Social History   Socioeconomic History  . Marital status: Married    Spouse name: Not on file  . Number of children: Not on file  . Years of education: Not on file  . Highest education level: Not on file  Occupational History  . Not on file  Social Needs  . Financial resource strain: Not on file  . Food insecurity:    Worry: Not on file    Inability: Not on file  . Transportation needs:    Medical: Not on file    Non-medical: Not on file  Tobacco Use  . Smoking status: Never Smoker  . Smokeless tobacco: Never Used  Substance and Sexual Activity  . Alcohol use: Not Currently  . Drug use: Never  . Sexual activity: Yes  Lifestyle  . Physical activity:    Days per week: Not on file    Minutes per session: Not on file  . Stress: Not on file  Relationships  . Social connections:    Talks on phone: Not on file    Gets together: Not on file    Attends religious service: Not on file    Active member of club or organization: Not on file    Attends meetings of clubs or organizations: Not on file    Relationship status: Not on file  . Intimate partner violence:    Fear of current or ex partner:  Not on file    Emotionally abused: Not on file    Physically abused: Not on file    Forced sexual activity: Not on file  Other Topics Concern  . Not on file  Social History Narrative  . Not on file    FAMILY HISTORY: Family History  Problem Relation Age of Onset  . Hypertension Mother   . Heart disease Father   . Hypertension Brother     ALLERGIES:  has No Known Allergies.  MEDICATIONS:  Current Outpatient Medications  Medication Sig Dispense Refill  . ibuprofen (ADVIL,MOTRIN) 200 MG tablet Take 200 mg by mouth every 6 (six) hours as needed. Ibuprofen 200 mg 2 tablets As needed.    Marland Kitchen LOSARTAN POTASSIUM PO Take 50 mg by mouth daily.    . Multiple Vitamin (MULTI-VITAMIN DAILY PO) Take by mouth.    . norethindrone (CAMILA)  0.35 MG tablet Take 1 tablet (0.35 mg total) by mouth daily. 3 Package 3  . simvastatin (ZOCOR) 20 MG tablet Take 20 mg by mouth every evening.     No current facility-administered medications for this visit.     REVIEW OF SYSTEMS:   Constitutional: Denies fevers, chills or abnormal night sweats Eyes: Denies blurriness of vision, double vision or watery eyes Ears, nose, mouth, throat, and face: Denies mucositis or sore throat Respiratory: Denies cough, dyspnea or wheezes Cardiovascular: Denies palpitation, chest discomfort or lower extremity swelling Gastrointestinal:  Denies nausea, heartburn or change in bowel habits Skin: Denies abnormal skin rashes Lymphatics: Denies new lymphadenopathy or easy bruising Neurological:Denies numbness, tingling or new weaknesses Behavioral/Psych: Mood is stable, no new changes  Breast:  Denies any palpable lumps or discharge All other systems were reviewed with the patient and are negative.  PHYSICAL EXAMINATION: ECOG PERFORMANCE STATUS: 0 - Asymptomatic  Vitals:   07/25/18 1248  BP: (!) 145/94  Pulse: 93  Resp: 18  Temp: 98.5 F (36.9 C)  SpO2: 97%   Filed Weights   07/25/18 1248  Weight: 237 lb 4.8 oz (107.6 kg)    GENERAL:alert, no distress and comfortable SKIN: skin color, texture, turgor are normal, no rashes or significant lesions EYES: normal, conjunctiva are pink and non-injected, sclera clear OROPHARYNX:no exudate, no erythema and lips, buccal mucosa, and tongue normal  NECK: supple, thyroid normal size, non-tender, without nodularity LYMPH:  no palpable lymphadenopathy in the cervical, axillary or inguinal LUNGS: clear to auscultation and percussion with normal breathing effort HEART: regular rate & rhythm and no murmurs and no lower extremity edema ABDOMEN:abdomen soft, non-tender and normal bowel sounds Musculoskeletal:no cyanosis of digits and no clubbing  PSYCH: alert & oriented x 3 with fluent speech NEURO: no focal  motor/sensory deficits BREAST: No palpable nodules in breast. No palpable axillary or supraclavicular lymphadenopathy (exam performed in the presence of a chaperone)   LABORATORY DATA:  I have reviewed the data as listed Lab Results  Component Value Date   WBC 4.2 07/25/2018   HGB 13.4 07/25/2018   HCT 40.6 07/25/2018   MCV 89.6 07/25/2018   PLT 207 07/25/2018   Lab Results  Component Value Date   NA 142 07/25/2018   K 4.3 07/25/2018   CL 107 07/25/2018   CO2 24 07/25/2018    RADIOGRAPHIC STUDIES: I have personally reviewed the radiological reports and agreed with the findings in the report.  ASSESSMENT AND PLAN:  Malignant neoplasm of upper-outer quadrant of right breast in female, estrogen receptor negative ([place]) 07/13/2018:Screening detected right breast asymmetry UOQ 10  o'clock position 1.4 cm, axilla negative, biopsy revealed grade 2 IDC triple negative with a Ki-67 of 50% T1c N0 stage Ib AJCC 8 Patient works at [employer] and can work from home if necessary.  Pathology and radiology counseling: Discussed with the patient, the details of pathology including the type of breast cancer,the clinical staging, the significance of ER, PR and HER-2/neu receptors and the implications for treatment. After reviewing the pathology in detail, we proceeded to discuss the different treatment options between surgery, radiation, chemotherapy, antiestrogen therapies.  Recommendation: 1.  Breast conserving surgery with sentinel lymph node biopsy 2. adjuvant chemotherapy with dose dense Adriamycin and Cytoxan x4 followed by Taxol weekly x12 3.  Adjuvant radiation therapy  Genetics consultation Port placement Echocardiogram Chemo class  Follow-up after surgery to discuss treatment plan.  Chemo will start 1 month after surgery.   All questions were answered. The patient knows to call the clinic with any problems, questions or concerns.    Harriette Ohara, MD 07/25/18

## 2018-07-25 NOTE — Progress Notes (Signed)
Nutrition Assessment  Reason for Assessment:  Pt seen in Breast Clinic  ASSESSMENT:   52 year old female with new diagnosis of breast cancer.  Past medical history of HTN.   Patient reports normal appetite  Medications:  reviewed  Labs: reviewed  Anthropometrics:   Height: 66.5 inches Weight: 237 lb BMI: 37   NUTRITION DIAGNOSIS: Food and nutrition related knowledge deficit related to new diagnosis of breast cancer as evidenced by no prior need for nutrition related information.  INTERVENTION:   Discussed and provided packet of information regarding nutritional tips for breast cancer patients.  Questions answered.  Teachback method used.  Contact information provided and patient knows to contact me with questions/concerns.    MONITORING, EVALUATION, and GOAL: Pt will consume a healthy plant based diet to maintain lean body mass throughout treatment.   Vernona Peake B. Zenia Resides, [place], [place] Registered Dietitian [phone] (pager)

## 2018-07-26 ENCOUNTER — Encounter: Payer: Self-pay | Admitting: Genetics

## 2018-07-26 ENCOUNTER — Inpatient Hospital Stay (HOSPITAL_BASED_OUTPATIENT_CLINIC_OR_DEPARTMENT_OTHER): Payer: BLUE CROSS/BLUE SHIELD | Admitting: Genetics

## 2018-07-26 DIAGNOSIS — C50411 Malignant neoplasm of upper-outer quadrant of right female breast: Secondary | ICD-10-CM

## 2018-07-26 DIAGNOSIS — Z79899 Other long term (current) drug therapy: Secondary | ICD-10-CM

## 2018-07-26 DIAGNOSIS — I10 Essential (primary) hypertension: Secondary | ICD-10-CM

## 2018-07-26 DIAGNOSIS — Z171 Estrogen receptor negative status [ER-]: Secondary | ICD-10-CM

## 2018-07-26 DIAGNOSIS — Z800 Family history of malignant neoplasm of digestive organs: Secondary | ICD-10-CM | POA: Insufficient documentation

## 2018-07-26 DIAGNOSIS — E785 Hyperlipidemia, unspecified: Secondary | ICD-10-CM

## 2018-07-26 DIAGNOSIS — N915 Oligomenorrhea, unspecified: Secondary | ICD-10-CM

## 2018-07-26 NOTE — Progress Notes (Signed)
REFERRING PROVIDER: Nicholas Lose, MD [address] [place], [place] [address]  PRIMARY PROVIDER:  [name], MD  PRIMARY REASON FOR VISIT:  1. Malignant neoplasm of upper-outer quadrant of right breast in female, estrogen receptor negative ([place])   2. Family history of pancreatic cancer    HISTORY OF PRESENT ILLNESS:   Jennifer Carroll, a 52 y.o. female, was seen for a [facility] cancer genetics consultation at the request of Dr. [name] due to a personal and family history of cancer.  Jennifer Carroll presents to clinic today to discuss the possibility of a hereditary predisposition to cancer, genetic testing, and to further clarify her future cancer risks, as well as potential cancer risks for family members.   In Aug 2019, at the age of 65, Jennifer Carroll was diagnosed with triple negative invasive ductal carcinoma  of the right breast.  She is currently planning to have breast conserving surgery followed by adjuvant chemotherapy and adjuvant radiation.  CANCER HISTORY:    Malignant neoplasm of upper-outer quadrant of right breast in female, estrogen receptor negative ([place])   07/13/2018 Initial Diagnosis    Screening detected right breast asymmetry UOQ 10 o'clock position 1.4 cm, axilla negative, biopsy revealed grade 2 IDC triple negative with a Ki-67 of 50% T1c N0 stage Ib AJCC 8     HORMONAL RISK FACTORS:  First live birth at age 17.  Ovaries intact: yes.  Hysterectomy: no.  Colonoscopy: no; not examined.  Past Medical History:  Diagnosis Date  . AMA (advanced maternal age) multigravida 35+ 03/28/05   with no amniocentesis  . 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 Family history of pancreatic cancer   . H/O infertility   . H/O menorrhagia   . History of chicken pox   . History of measles, mumps, or rubella   . Hyperlipidemia   . Hypertension   . Low iron   . Menses, irregular   . Oligomenorrhea     Past Surgical History:  Procedure Laterality Date  . EYE SURGERY      left eye at age 53  . head surgery     to cover hole in head at age of  70 yrs young  . WISDOM TOOTH EXTRACTION  2001    Social History   Socioeconomic History  . Marital status: Married    Spouse name: Not on file  . Number of children: Not on file  . Years of education: Not on file  . Highest education level: Not on file  Occupational History  . Not on file  Social Needs  . Financial resource strain: Not on file  . Food insecurity:    Worry: Not on file    Inability: Not on file  . Transportation needs:    Medical: Not on file    Non-medical: Not on file  Tobacco Use  . Smoking status: Never Smoker  . Smokeless tobacco: Never Used  Substance and Sexual Activity  . Alcohol use: Not Currently  . Drug use: Never  . Sexual activity: Yes  Lifestyle  . Physical activity:    Days per week: Not on file    Minutes per session: Not on file  . Stress: Not on file  Relationships  . Social connections:    Talks on phone: Not on file    Gets together: Not on file    Attends religious service: Not on file    Active member of club or organization: Not on file  Attends meetings of clubs or organizations: Not on file    Relationship status: Not on file  Other Topics Concern  . Not on file  Social History Narrative  . Not on file     FAMILY HISTORY:  We obtained a detailed, 4-generation family history.  Significant diagnoses are listed below: Family History  Problem Relation Age of Onset  . Hypertension Mother   . Kidney disease Mother   . Heart disease Father   . Hypertension Brother   . Kidney failure Maternal Grandfather   . Pancreatic cancer Cousin        dx >50    Ms.  Harlan has a 69 year-old daughter and a 33 year-old son with no history of cancer. Ms. Braatz has 3 sisters who died shortly after birth or as a todler.  Ms. Badilla has a brother who is 26 with no history of cancer.    Ms. Peffley father: died at 18, no history of cancer Paternal  aunts/Uncles: 1 paternal aunt died in her 46's with no history of cancer.  Paternal cousins: no history of cancer Paternal grandfather: unk, died before patient was born Paternal grandmother:died in her 3's/90's cause unk  Ms. Mooneyhan's mother: died at 40 due to kidney failure.  She had a hysterectomy in her lifetime.  Maternal Aunts/Uncles: 6 maternal aunts/uncles, no history of cancer.  Maternal cousins: 1 maternal cousin died of pancreatic cancer dx >50 Maternal grandfather: died at 79 due to kidney disease Maternal grandmother:unk, died before patient was born  Ms. Wrobleski is unaware of previous family history of genetic testing for hereditary cancer risks. Patient's maternal ancestors are of Cacuasian/German descent, and paternal ancestors are of Caucasian/German descent. There is no reported Ashkenazi Jewish ancestry. There is no known consanguinity.  GENETIC COUNSELING ASSESSMENT: Jennifer Carroll is a 52 y.o. female with a personal and family history which is somewhat suggestive of a Hereditary Cancer Predisposition Syndrome. We, therefore, discussed and recommended the following at today's visit.   DISCUSSION: We reviewed the characteristics, features and inheritance patterns of hereditary cancer syndromes. We also discussed genetic testing, including the appropriate family members to test, the process of testing, insurance coverage and turn-around-time for results. We discussed the implications of a negative, positive and/or variant of uncertain significant result. In order to get genetic test results in a timely manner so that Jennifer Carroll can use these genetic test results for surgical decisions, we recommended Jennifer Carroll pursue genetic testing for the Breast Cancer STAT Panel.  We Then recommend Jennifer Carroll reflex to the  Common Hereditary cancers gene panel.   The STAT Breast cancer panel offered by [facility] includes sequencing and rearrangement analysis for the following 9 genes:   ATM, BRCA1, BRCA2, CDH1, CHEK2, PALB2, PTEN, STK11 and TP53.    The Common Hereditary Cancer Panel offered by [facility] includes sequencing and/or deletion duplication testing of the following 47 genes: APC, ATM, AXIN2, BARD1, BMPR1A, BRCA1, BRCA2, BRIP1, CDH1, CDKN2A (p14ARF), CDKN2A (p16INK4a), CKD4, CHEK2, CTNNA1, DICER1, EPCAM (Deletion/duplication testing only), GREM1 (promoter region deletion/duplication testing only), KIT, MEN1, MLH1, MSH2, MSH3, MSH6, MUTYH, NBN, NF1, NHTL1, PALB2, PDGFRA, PMS2, POLD1, POLE, PTEN, RAD50, RAD51C, RAD51D, SDHB, SDHC, SDHD, SMAD4, SMARCA4. STK11, TP53, TSC1, TSC2, and VHL.  The following genes were evaluated for sequence changes only: SDHA and HOXB13 c.251G>A variant only.  We discussed that only 5-10% of cancers are associated with a Hereditary cancer predisposition syndrome.  One of the most common hereditary cancer syndromes that increases breast cancer  risk is called Hereditary Breast and Ovarian Cancer (HBOC) syndrome.  This syndrome is caused by mutations in the BRCA1 and BRCA2 genes.  This syndrome increases an individual's lifetime risk to develop breast, ovarian, pancreatic, and other types of cancer.  There are also many other cancer predisposition syndromes caused by mutations in several other genes.  We discussed that if she is found to have a mutation in one of these genes, it may impact surgical decisions, and alter future medical management recommendations such as increased cancer screenings and consideration of risk reducing surgeries.  A positive result could also have implications for the patient's family members.  A Negative result would mean we were unable to identify a hereditary component to her cancer, but does not rule out the possibility of a hereditary basis for her cancer.  There could be mutations that are undetectable by current technology, or in genes not yet tested or identified to increase cancer risk.    We discussed the potential to  find a Variant of Uncertain Significance or VUS.  These are variants that have not yet been identified as pathogenic or benign, and it is unknown if this variant is associated with increased cancer risk or if this is a normal finding.  Most VUS's are reclassified to benign or likely benign.   It should not be used to make medical management decisions. With time, we suspect the lab will determine the significance of any VUS's identified if any.   Based on Jennifer Carroll's personal and family history of cancer, she meets medical criteria for genetic testing. Despite that she meets criteria, she may still have an out of pocket cost. The laboratory can provide her with an estimate of her OOP cost. she was given the contact information of the laboratory if she has further questions.   She understands that the laboratory will not be able to give her an estimate up front if she would like her testing done STAT (for surgical decisions).   PLAN: After considering the risks, benefits, and limitations, Jennifer Carroll  provided informed consent to pursue genetic testing and the blood sample was sent to [facility] for analysis of the Breast Cancer STAT panel with plans to reflex to the Common Hereditary Cancers Panel. Preliminary results should be available within approximately  5-12 dyas, at which point they will be disclosed by telephone to Jennifer Carroll, as will any additional recommendations warranted by these results. Jennifer Carroll will receive a summary of her genetic counseling visit and a copy of her results once available. This information will also be available in Epic. We encouraged Jennifer Carroll to remain in contact with cancer genetics annually so that we can continuously update the family history and inform her of any changes in cancer genetics and testing that may be of benefit for her family. Jennifer Carroll questions were answered to her satisfaction today. Our contact information was provided should  additional questions or concerns arise.  Based on Jennifer Carroll's family history, we recommended her maternal relatives also, have genetic counseling and testing. Jennifer Carroll will let us know if we can be of any assistance in coordinating genetic counseling and/or testing for this family member.   Lastly, we encouraged Jennifer Carroll to remain in contact with cancer genetics annually so that we can continuously update the family history and inform her of any changes in cancer genetics and testing that may be of benefit for this family.   Ms.  Carroll questions were answered to her  satisfaction today. Our contact information was provided should additional questions or concerns arise. Thank you for the referral and allowing [place] to share in the care of your patient.   Tana Felts, MS, [facility] Certified Genetic Counselor [email or web address] phone: [phone]  The patient was seen for a total of 30 minutes in face-to-face genetic counseling.  This patient was discussed with Drs. Magrinat, [name] and/or Burr Medico who agrees with the above.

## 2018-07-27 ENCOUNTER — Telehealth: Payer: Self-pay | Admitting: Hematology and Oncology

## 2018-07-27 ENCOUNTER — Other Ambulatory Visit: Payer: Self-pay | Admitting: General Surgery

## 2018-07-27 ENCOUNTER — Encounter: Payer: Self-pay | Admitting: General Practice

## 2018-07-27 DIAGNOSIS — C50411 Malignant neoplasm of upper-outer quadrant of right female breast: Secondary | ICD-10-CM

## 2018-07-27 NOTE — Telephone Encounter (Signed)
Spoke to patient regarding upcoming sept appts per 8/16 sch message

## 2018-07-27 NOTE — Progress Notes (Signed)
[place] Psychosocial Distress Screening Spiritual Care  Met with Jennifer Carroll, husband Konrad Dolores, son, and SIL in Breast Multidisciplinary Clinic to introduce [facility] team/resources, reviewing distress screen per protocol.  The patient scored a 4 on the Psychosocial Distress Thermometer which indicates moderate distress. Also assessed for distress and other psychosocial needs.   ONCBCN DISTRESS SCREENING 07/27/2018  Screening Type Initial Screening  Distress experienced in past week (1-10) 4  Practical problem type Insurance;Work/school  Emotional problem type Nervousness/Anxiety  Information Concerns Type Lack of info about diagnosis;Lack of info about treatment  Referral to support programs Yes   Per pt, she feels better now that information concerns and uncertainty have resolved through [facility] and meeting team.    Follow up needed: No. Per pt, no other needs at this time, but she has full packet of [facility] team/resources. Please also page if needs arise or circumstances change. Thank you.   [place], [place], [facility] Pager [phone] Voicemail [phone]

## 2018-07-31 ENCOUNTER — Other Ambulatory Visit: Payer: Self-pay

## 2018-07-31 ENCOUNTER — Telehealth: Payer: Self-pay | Admitting: *Deleted

## 2018-07-31 ENCOUNTER — Encounter (HOSPITAL_BASED_OUTPATIENT_CLINIC_OR_DEPARTMENT_OTHER): Payer: Self-pay | Admitting: *Deleted

## 2018-07-31 ENCOUNTER — Other Ambulatory Visit: Payer: Self-pay | Admitting: *Deleted

## 2018-07-31 DIAGNOSIS — Z171 Estrogen receptor negative status [ER-]: Principal | ICD-10-CM

## 2018-07-31 DIAGNOSIS — C50411 Malignant neoplasm of upper-outer quadrant of right female breast: Secondary | ICD-10-CM

## 2018-07-31 NOTE — Telephone Encounter (Signed)
  Oncology Nurse Navigator Documentation  Navigator Location: [facility] (07/31/18 1200)   )Navigator Encounter Type: Telephone;MDC Follow-up (07/31/18 1200) Telephone: Outgoing Call;Clinic/MDC Follow-up (07/31/18 1200)                                                  Time Spent with Patient: 15 (07/31/18 1200)

## 2018-08-02 ENCOUNTER — Encounter (HOSPITAL_BASED_OUTPATIENT_CLINIC_OR_DEPARTMENT_OTHER)
Admission: RE | Admit: 2018-08-02 | Discharge: 2018-08-02 | Disposition: A | Discharge status: Home / Self Care | Payer: BLUE CROSS/BLUE SHIELD | Source: Ambulatory Visit | Attending: General Surgery | Admitting: General Surgery

## 2018-08-02 DIAGNOSIS — Z170 Estrogen receptor positive status [ER+]: Secondary | ICD-10-CM | POA: Insufficient documentation

## 2018-08-02 DIAGNOSIS — C50411 Malignant neoplasm of upper-outer quadrant of right female breast: Secondary | ICD-10-CM | POA: Insufficient documentation

## 2018-08-02 NOTE — Progress Notes (Signed)
Ensure Pre Surgery drink given to patient with instructions to complete by 0400 DOS, surgical soap given with instructions for use. Patient verbalized understanding of instructions.

## 2018-08-05 NOTE — H&P (Signed)
Jennifer Carroll Location: [facility] Patient #: [id number] DOB: [birth year] Undefined / Language: Jennifer Carroll / Race: White Female       History of Present Illness       . This is a 52 year old woman, referred by Dr. 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 Her husband and sister-in-law are with her throughout the encounter. [name] is her gynecologist. She sees [name] from time to time. She is seen in the [facility] today by Dr. [name], Dr. [name], and me.      She has no prior history of breast problems. She gets annual screening mammograms. This appears mammograms and ultrasound showed a category B density. 1.4 cm mass in the right breast upper outer quadrant, far posterior, 10 o'clock position, 20 cm from the nipple. Ultrasound of the right axilla is negative. No signs of cancer elsewhere. 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 Triple negative breast cancer. She has been advised to have chemotherapy and agrees with this.      Past history significant for elevated BMI, hyperlipidemia, hypertension. Family history reveals cousin with pancreatic cancer. Mother has hypertension. Father had heart disease. There is no family history of breast, ovarian, or colon cancer. Social history as they're married with 2 children. Denies alcohol or tobacco. 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      We had a long discussion about management of her breast cancer. I discussed the different treatment lumpectomy and radiation therapy and compare that to mastectomy with or without reconstruction. I advised her to have sentinel lymph node biopsy regardless and described that. She is advised to have Port-A-Cath insertion at the same time discussed that in detail. She agrees with all of this.       She'll be scheduled for right breast lumpectomy with radioactive seed localization,  right axillary deep Sentinel lymph node biopsy, inject blue dye right breast, insertion of Port-A-Cath with ultrasound.  I discussed indications, details, techniques, and numerous risk of the surgery with her. She is aware of the risk of bleeding, infection, bilateral attempts to get the port in, pneumothorax, malfunction of the port requiring revision, breast bleeding or infection, breast deformity and cosmetic issues, nerve damage with chronic pain or numbness, arm swelling, arm numbness, reoperation for positive nodes are positive margins. She understands all these issues. All her questions are answered. She agrees with this plan.   Addendum Note(Keyna Blizard M. Dalbert Batman MD; 08/05/2018 12:43 PM) Seen by genetics 07/26/2018 and Breast Cancer STAT panel sent.   Past Surgical History  Breast Biopsy  Right. Oral Surgery   Diagnostic Studies History Colonoscopy  never Mammogram  within last year Pap Smear  1-5 years ago  Medication History  Medications Reconciled  Social History  Caffeine use  Carbonated beverages, Tea. No alcohol use  No drug use  Tobacco use  Never smoker.  Family History  Colon Polyps  Family Members In General. Heart Disease  Father, Mother. Heart disease in female family member before age 45  Hypertension  Brother, Mother. Kidney Disease  Mother. Respiratory Condition  Mother. Thyroid problems  Mother.  Other Problems  High blood pressure  Hypercholesterolemia     Review of Systems  General Not Present- Appetite Loss, Chills, Fatigue, Fever, Night Sweats, Weight Gain and Weight Loss. Skin Not Present- Change in Wart/Mole, Dryness, Hives, Jaundice, New Lesions, Non-Healing Wounds, Rash and Ulcer. HEENT Present- Wears glasses/contact lenses. Not  Present- Earache, Hearing Loss, Hoarseness, Nose Bleed, Oral Ulcers, Ringing in the Ears, Seasonal Allergies, Sinus Pain, Sore Throat, Visual Disturbances and Yellow Eyes. Respiratory Not Present-  Bloody sputum, Chronic Cough, Difficulty Breathing, Snoring and Wheezing. Breast Not Present- Breast Mass, Breast Pain, Nipple Discharge and Skin Changes. Cardiovascular Not Present- Chest Pain, Difficulty Breathing Lying Down, Leg Cramps, Palpitations, Rapid Heart Rate, Shortness of Breath and Swelling of Extremities. Gastrointestinal Not Present- Abdominal Pain, Bloating, Bloody Stool, Change in Bowel Habits, Chronic diarrhea, Constipation, Difficulty Swallowing, Excessive gas, Gets full quickly at meals, Hemorrhoids, Indigestion, Nausea, Rectal Pain and Vomiting. Female Genitourinary Not Present- Frequency, Nocturia, Painful Urination, Pelvic Pain and Urgency. Musculoskeletal Not Present- Back Pain, Joint Pain, Joint Stiffness, Muscle Pain, Muscle Weakness and Swelling of Extremities. Neurological Not Present- Decreased Memory, Fainting, Headaches, Numbness, Seizures, Tingling, Tremor, Trouble walking and Weakness. Psychiatric Not Present- Anxiety, Bipolar, Change in Sleep Pattern, Depression, Fearful and Frequent crying. Endocrine Not Present- Cold Intolerance, Excessive Hunger, Hair Changes, Heat Intolerance, Hot flashes and New Diabetes. Hematology Not Present- Blood Thinners, Easy Bruising, Excessive bleeding, Gland problems, HIV and Persistent Infections.   Physical Exam  General Mental Status-Alert. General Appearance-Consistent with stated age. Hydration-Well hydrated. Voice-Normal. Note: Elevated BMI. She is distressed and tearful after hearing about the need for chemotherapy. Her husband and sister-in-law are with her throughout the encounter   Head and Neck Head-normocephalic, atraumatic with no lesions or palpable masses. Trachea-midline. Thyroid Gland Characteristics - normal size and consistency.  Eye Eyeball - Bilateral-Extraocular movements intact. Sclera/Conjunctiva - Bilateral-No scleral icterus.  Chest and Lung Exam Chest and lung exam reveals  -quiet, even and easy respiratory effort with no use of accessory muscles and on auscultation, normal breath sounds, no adventitious sounds and normal vocal resonance. Inspection Chest Wall - Normal. Back - normal.  Breast Note: Breasts are large. Deep in the upper outer quadrant of the right breast at 105 field focal area of thickening less than 2 cm. Nontender. Biopsy marks. There are no other skin changes or palpable masses in either breast. There is no axillary adenopathy.   Cardiovascular Cardiovascular examination reveals -normal heart sounds, regular rate and rhythm with no murmurs and normal pedal pulses bilaterally.  Abdomen Inspection Inspection of the abdomen reveals - No Hernias. Skin - Scar - no surgical scars. Palpation/Percussion Palpation and Percussion of the abdomen reveal - Soft, Non Tender, No Rebound tenderness, No Rigidity (guarding) and No hepatosplenomegaly. Auscultation Auscultation of the abdomen reveals - Bowel sounds normal.  Neurologic Neurologic evaluation reveals -alert and oriented x 3 with no impairment of recent or remote memory. Mental Status-Normal.  Musculoskeletal Normal Exam - Left-Upper Extremity Strength Normal and Lower Extremity Strength Normal. Normal Exam - Right-Upper Extremity Strength Normal and Lower Extremity Strength Normal.  Lymphatic Head & Neck  General Head & Neck Lymphatics: Bilateral - Description - Normal. Axillary  General Axillary Region: Bilateral - Description - Normal. Tenderness - Non Tender. Femoral & Inguinal  Generalized Femoral & Inguinal Lymphatics: Bilateral - Description - Normal. Tenderness - Non Tender.    Assessment & Plan PRIMARY CANCER OF UPPER OUTER QUADRANT OF RIGHT FEMALE BREAST (C50.411)   Your recent imaging studies and biopsy show a cancer in the right breast upper outer quadrant This is a 1.4 cm invasive ductal carcinoma There does not seem to be any cancer elsewhere This is  a triple negative breast cancer, and these are more aggressive than some other cancers. You have been advised to have chemotherapy and you agree  You will be scheduled for right breast lumpectomy with radioactive seed localization, right axillary sentinel lymph node biopsy, and Port-A-Cath insertion with ultrasound I have discussed the indications, details, techniques, and numerous risk of the surgery with you and your husband and sister-in-law.    OBESITY (BMI 30-39.9) (E66.9) HYPERTENSION, ESSENTIAL (I10) HYPERLIPIDEMIA, ACQUIRED (E78.5)    Khaylee Mcevoy M. Dalbert Batman, M.D., [facility] General and Minimally invasive Surgery Breast and Colorectal Surgery Office:   [phone] Pager:   [phone]

## 2018-08-06 ENCOUNTER — Telehealth: Payer: Self-pay | Admitting: Genetic Counselor

## 2018-08-06 ENCOUNTER — Encounter: Payer: Self-pay | Admitting: Genetic Counselor

## 2018-08-06 ENCOUNTER — Ambulatory Visit
Admission: RE | Admit: 2018-08-06 | Discharge: 2018-08-06 | Disposition: A | Discharge status: Home / Self Care | Payer: BLUE CROSS/BLUE SHIELD | Source: Ambulatory Visit | Attending: General Surgery | Admitting: General Surgery

## 2018-08-06 DIAGNOSIS — Z1379 Encounter for other screening for genetic and chromosomal anomalies: Secondary | ICD-10-CM | POA: Insufficient documentation

## 2018-08-06 DIAGNOSIS — C50911 Malignant neoplasm of unspecified site of right female breast: Secondary | ICD-10-CM | POA: Diagnosis not present

## 2018-08-06 DIAGNOSIS — C50411 Malignant neoplasm of upper-outer quadrant of right female breast: Secondary | ICD-10-CM

## 2018-08-06 NOTE — Telephone Encounter (Signed)
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  Discussed that the NBN gene increases the risk about 2-2.5 fold.  According to NCCN, we would not recommend a RRM based on this test result, however, she could have a discussion with Jennifer Carroll about her feelings on this.  Discussed that the BRCA1 VUS would not change her medical management.  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  NBS is associated with immunodeficiency, cognitive disorder, increased cancer risk, and progressive microcephaly.  This would be important for family members to know about not only for their own personal cancer risk, but also for reproductive risks.  I will have Jennifer Carroll touch base with her in the next day or so.  Patient is scheduled for surgery at 8 AM on 8/27 and is planning on going ahead with lumpectomy at this time.

## 2018-08-07 ENCOUNTER — Encounter (HOSPITAL_COMMUNITY)
Admission: RE | Admit: 2018-08-07 | Discharge: 2018-08-07 | Disposition: A | Discharge status: Home / Self Care | Payer: BLUE CROSS/BLUE SHIELD | Source: Ambulatory Visit | Attending: General Surgery | Admitting: General Surgery

## 2018-08-07 ENCOUNTER — Encounter (HOSPITAL_BASED_OUTPATIENT_CLINIC_OR_DEPARTMENT_OTHER): Payer: Self-pay | Admitting: Certified Registered"

## 2018-08-07 ENCOUNTER — Ambulatory Visit
Admission: RE | Admit: 2018-08-07 | Discharge: 2018-08-07 | Disposition: A | Discharge status: Home / Self Care | Payer: BLUE CROSS/BLUE SHIELD | Source: Ambulatory Visit | Attending: General Surgery | Admitting: General Surgery

## 2018-08-07 ENCOUNTER — Encounter (HOSPITAL_BASED_OUTPATIENT_CLINIC_OR_DEPARTMENT_OTHER)
Admission: RE | Disposition: A | Discharge status: Home / Self Care | Payer: Self-pay | Source: Ambulatory Visit | Attending: General Surgery

## 2018-08-07 ENCOUNTER — Ambulatory Visit (HOSPITAL_BASED_OUTPATIENT_CLINIC_OR_DEPARTMENT_OTHER): Payer: BLUE CROSS/BLUE SHIELD | Admitting: Anesthesiology

## 2018-08-07 ENCOUNTER — Ambulatory Visit (HOSPITAL_COMMUNITY): Payer: BLUE CROSS/BLUE SHIELD

## 2018-08-07 ENCOUNTER — Other Ambulatory Visit: Payer: Self-pay | Admitting: Hematology and Oncology

## 2018-08-07 ENCOUNTER — Other Ambulatory Visit: Payer: Self-pay

## 2018-08-07 ENCOUNTER — Ambulatory Visit (HOSPITAL_BASED_OUTPATIENT_CLINIC_OR_DEPARTMENT_OTHER)
Admission: RE | Admit: 2018-08-07 | Discharge: 2018-08-07 | Disposition: A | Discharge status: Home / Self Care | Payer: BLUE CROSS/BLUE SHIELD | Source: Ambulatory Visit | Attending: General Surgery | Admitting: General Surgery

## 2018-08-07 DIAGNOSIS — R928 Other abnormal and inconclusive findings on diagnostic imaging of breast: Secondary | ICD-10-CM | POA: Diagnosis not present

## 2018-08-07 DIAGNOSIS — E669 Obesity, unspecified: Secondary | ICD-10-CM | POA: Diagnosis not present

## 2018-08-07 DIAGNOSIS — E785 Hyperlipidemia, unspecified: Secondary | ICD-10-CM | POA: Diagnosis not present

## 2018-08-07 DIAGNOSIS — I10 Essential (primary) hypertension: Secondary | ICD-10-CM | POA: Insufficient documentation

## 2018-08-07 DIAGNOSIS — Z171 Estrogen receptor negative status [ER-]: Secondary | ICD-10-CM | POA: Diagnosis not present

## 2018-08-07 DIAGNOSIS — C50911 Malignant neoplasm of unspecified site of right female breast: Secondary | ICD-10-CM | POA: Diagnosis not present

## 2018-08-07 DIAGNOSIS — C50411 Malignant neoplasm of upper-outer quadrant of right female breast: Secondary | ICD-10-CM | POA: Insufficient documentation

## 2018-08-07 DIAGNOSIS — G8918 Other acute postprocedural pain: Secondary | ICD-10-CM | POA: Diagnosis not present

## 2018-08-07 DIAGNOSIS — E7800 Pure hypercholesterolemia, unspecified: Secondary | ICD-10-CM | POA: Insufficient documentation

## 2018-08-07 DIAGNOSIS — Z95828 Presence of other vascular implants and grafts: Secondary | ICD-10-CM

## 2018-08-07 DIAGNOSIS — Z6837 Body mass index (BMI) 37.0-37.9, adult: Secondary | ICD-10-CM | POA: Insufficient documentation

## 2018-08-07 DIAGNOSIS — Z452 Encounter for adjustment and management of vascular access device: Secondary | ICD-10-CM | POA: Diagnosis not present

## 2018-08-07 HISTORY — PX: PORTACATH PLACEMENT: SHX2246

## 2018-08-07 HISTORY — DX: Malignant (primary) neoplasm, unspecified: C80.1

## 2018-08-07 HISTORY — PX: BREAST LUMPECTOMY WITH RADIOACTIVE SEED AND SENTINEL LYMPH NODE BIOPSY: SHX6550

## 2018-08-07 LAB — POCT PREGNANCY, URINE: Preg Test, Ur: NEGATIVE

## 2018-08-07 SURGERY — BREAST LUMPECTOMY WITH RADIOACTIVE SEED AND SENTINEL LYMPH NODE BIOPSY
Anesthesia: Regional | Site: Chest | Laterality: Right

## 2018-08-07 MED ORDER — EPHEDRINE SULFATE 50 MG/ML IJ SOLN
INTRAMUSCULAR | Status: DC | PRN
  Administered 2018-08-07: 10 mg via INTRAVENOUS

## 2018-08-07 MED ORDER — LIDOCAINE HCL (CARDIAC) PF 100 MG/5ML IV SOSY
PREFILLED_SYRINGE | INTRAVENOUS | Status: DC | PRN
  Administered 2018-08-07: 30 mg via INTRAVENOUS
  Administered 2018-08-07: 50 mg via INTRAVENOUS

## 2018-08-07 MED ORDER — LACTATED RINGERS IV SOLN
INTRAVENOUS | Status: DC
  Administered 2018-08-07 (×2): via INTRAVENOUS

## 2018-08-07 MED ORDER — PROPOFOL 10 MG/ML IV BOLUS
INTRAVENOUS | Status: AC
  Filled 2018-08-07: qty 20

## 2018-08-07 MED ORDER — HEPARIN SOD (PORK) LOCK FLUSH 100 UNIT/ML IV SOLN
INTRAVENOUS | Status: AC
  Filled 2018-08-07: qty 5

## 2018-08-07 MED ORDER — ACETAMINOPHEN 650 MG RE SUPP: 650.0000 mg | RECTAL | Status: DC | PRN

## 2018-08-07 MED ORDER — CELECOXIB 200 MG PO CAPS
200.0000 mg | ORAL_CAPSULE | ORAL | Status: AC
  Administered 2018-08-07: 200 mg via ORAL

## 2018-08-07 MED ORDER — GABAPENTIN 300 MG PO CAPS
ORAL_CAPSULE | ORAL | Status: AC
  Filled 2018-08-07: qty 1

## 2018-08-07 MED ORDER — LORAZEPAM 0.5 MG PO TABS: 0.5000 mg | ORAL_TABLET | Freq: Every evening | ORAL | 0 refills | Status: DC | PRN

## 2018-08-07 MED ORDER — FENTANYL CITRATE (PF) 100 MCG/2ML IJ SOLN: 25.0000 ug | INTRAMUSCULAR | Status: DC | PRN

## 2018-08-07 MED ORDER — TECHNETIUM TC 99M SULFUR COLLOID FILTERED
1.0000 | Freq: Once | INTRAVENOUS | Status: AC | PRN
  Administered 2018-08-07: 1 via INTRADERMAL

## 2018-08-07 MED ORDER — LACTATED RINGERS IV SOLN: INTRAVENOUS | Status: DC

## 2018-08-07 MED ORDER — SODIUM CHLORIDE 0.9 % IJ SOLN
INTRAMUSCULAR | Status: AC
  Filled 2018-08-07: qty 10

## 2018-08-07 MED ORDER — MIDAZOLAM HCL 2 MG/2ML IJ SOLN
1.0000 mg | INTRAMUSCULAR | Status: DC | PRN
  Administered 2018-08-07: 2 mg via INTRAVENOUS

## 2018-08-07 MED ORDER — CEFAZOLIN SODIUM-DEXTROSE 2-4 GM/100ML-% IV SOLN
2.0000 g | INTRAVENOUS | Status: AC
  Administered 2018-08-07: 2 g via INTRAVENOUS

## 2018-08-07 MED ORDER — CHLORHEXIDINE GLUCONATE CLOTH 2 % EX PADS: 6.0000 | MEDICATED_PAD | Freq: Once | CUTANEOUS | Status: DC

## 2018-08-07 MED ORDER — CEFAZOLIN SODIUM-DEXTROSE 2-4 GM/100ML-% IV SOLN
INTRAVENOUS | Status: AC
  Filled 2018-08-07: qty 100

## 2018-08-07 MED ORDER — FENTANYL CITRATE (PF) 100 MCG/2ML IJ SOLN
50.0000 ug | INTRAMUSCULAR | Status: DC | PRN
  Administered 2018-08-07 (×2): 50 ug via INTRAVENOUS

## 2018-08-07 MED ORDER — SODIUM CHLORIDE 0.9 % IJ SOLN
INTRAVENOUS | Status: DC | PRN
  Administered 2018-08-07: 818 mL via INTRAMUSCULAR

## 2018-08-07 MED ORDER — BUPIVACAINE-EPINEPHRINE (PF) 0.5% -1:200000 IJ SOLN
INTRAMUSCULAR | Status: AC
  Filled 2018-08-07: qty 60

## 2018-08-07 MED ORDER — MIDAZOLAM HCL 2 MG/2ML IJ SOLN
INTRAMUSCULAR | Status: AC
  Filled 2018-08-07: qty 2

## 2018-08-07 MED ORDER — BUPIVACAINE-EPINEPHRINE (PF) 0.25% -1:200000 IJ SOLN
INTRAMUSCULAR | Status: AC
  Filled 2018-08-07: qty 30

## 2018-08-07 MED ORDER — METHYLENE BLUE 0.5 % INJ SOLN
INTRAVENOUS | Status: AC
  Filled 2018-08-07: qty 10

## 2018-08-07 MED ORDER — HEPARIN (PORCINE) IN NACL 2-0.9 UNITS/ML
INTRAMUSCULAR | Status: AC | PRN
  Administered 2018-08-07: 1 via INTRAVENOUS

## 2018-08-07 MED ORDER — BUPIVACAINE-EPINEPHRINE 0.5% -1:200000 IJ SOLN
INTRAMUSCULAR | Status: DC | PRN
  Administered 2018-08-07: 30 mL

## 2018-08-07 MED ORDER — DEXAMETHASONE 4 MG PO TABS: 4.0000 mg | ORAL_TABLET | Freq: Every day | ORAL | 1 refills | Status: AC

## 2018-08-07 MED ORDER — SODIUM CHLORIDE 0.9 % IV SOLN: 250.0000 mL | INTRAVENOUS | Status: DC | PRN

## 2018-08-07 MED ORDER — CELECOXIB 200 MG PO CAPS
ORAL_CAPSULE | ORAL | Status: AC
  Filled 2018-08-07: qty 1

## 2018-08-07 MED ORDER — FENTANYL CITRATE (PF) 100 MCG/2ML IJ SOLN
INTRAMUSCULAR | Status: AC
  Filled 2018-08-07: qty 2

## 2018-08-07 MED ORDER — SCOPOLAMINE 1 MG/3DAYS TD PT72: 1.0000 | MEDICATED_PATCH | Freq: Once | TRANSDERMAL | Status: DC | PRN

## 2018-08-07 MED ORDER — ACETAMINOPHEN 500 MG PO TABS
ORAL_TABLET | ORAL | Status: AC
  Filled 2018-08-07: qty 2

## 2018-08-07 MED ORDER — SODIUM CHLORIDE 0.9% FLUSH: 3.0000 mL | Freq: Two times a day (BID) | INTRAVENOUS | Status: DC

## 2018-08-07 MED ORDER — GABAPENTIN 300 MG PO CAPS
300.0000 mg | ORAL_CAPSULE | ORAL | Status: AC
  Administered 2018-08-07: 300 mg via ORAL

## 2018-08-07 MED ORDER — OXYCODONE HCL 5 MG PO TABS: 5.0000 mg | ORAL_TABLET | ORAL | Status: DC | PRN

## 2018-08-07 MED ORDER — HEPARIN (PORCINE) IN NACL 1000-0.9 UT/500ML-% IV SOLN
INTRAVENOUS | Status: AC
  Filled 2018-08-07: qty 500

## 2018-08-07 MED ORDER — HYDROCODONE-ACETAMINOPHEN 5-325 MG PO TABS: 1.0000 | ORAL_TABLET | Freq: Four times a day (QID) | ORAL | 0 refills | Status: DC | PRN

## 2018-08-07 MED ORDER — SODIUM CHLORIDE 0.9% FLUSH: 3.0000 mL | INTRAVENOUS | Status: DC | PRN

## 2018-08-07 MED ORDER — LIDOCAINE-PRILOCAINE 2.5-2.5 % EX CREA: TOPICAL_CREAM | CUTANEOUS | 3 refills | Status: DC

## 2018-08-07 MED ORDER — ROPIVACAINE HCL 7.5 MG/ML IJ SOLN
INTRAMUSCULAR | Status: DC | PRN
  Administered 2018-08-07: 20 mL via PERINEURAL

## 2018-08-07 MED ORDER — ONDANSETRON HCL 4 MG/2ML IJ SOLN
INTRAMUSCULAR | Status: DC | PRN
  Administered 2018-08-07: 4 mg via INTRAVENOUS

## 2018-08-07 MED ORDER — DEXAMETHASONE SODIUM PHOSPHATE 4 MG/ML IJ SOLN
INTRAMUSCULAR | Status: DC | PRN
  Administered 2018-08-07: 10 mg via INTRAVENOUS

## 2018-08-07 MED ORDER — ONDANSETRON HCL 8 MG PO TABS: 8.0000 mg | ORAL_TABLET | Freq: Two times a day (BID) | ORAL | 1 refills | Status: DC | PRN

## 2018-08-07 MED ORDER — ACETAMINOPHEN 500 MG PO TABS
1000.0000 mg | ORAL_TABLET | ORAL | Status: AC
  Administered 2018-08-07: 1000 mg via ORAL

## 2018-08-07 MED ORDER — PROCHLORPERAZINE MALEATE 10 MG PO TABS: 10.0000 mg | ORAL_TABLET | Freq: Four times a day (QID) | ORAL | 1 refills | Status: DC | PRN

## 2018-08-07 MED ORDER — ONDANSETRON HCL 4 MG/2ML IJ SOLN: 4.0000 mg | Freq: Once | INTRAMUSCULAR | Status: DC | PRN

## 2018-08-07 MED ORDER — HEPARIN SOD (PORK) LOCK FLUSH 100 UNIT/ML IV SOLN
INTRAVENOUS | Status: DC | PRN
  Administered 2018-08-07: 400 [IU] via INTRAVENOUS

## 2018-08-07 MED ORDER — ACETAMINOPHEN 325 MG PO TABS: 650.0000 mg | ORAL_TABLET | ORAL | Status: DC | PRN

## 2018-08-07 MED ORDER — PROPOFOL 10 MG/ML IV BOLUS
INTRAVENOUS | Status: DC | PRN
  Administered 2018-08-07: 150 mg via INTRAVENOUS

## 2018-08-07 SURGICAL SUPPLY — 84 items
ADH SKN CLS APL DERMABOND .7 (GAUZE/BANDAGES/DRESSINGS) ×4
APL SKNCLS STERI-STRIP NONHPOA (GAUZE/BANDAGES/DRESSINGS)
APPLIER CLIP 11 MED OPEN (CLIP) ×4
APR CLP MED 11 20 MLT OPN (CLIP) ×2
BAG DECANTER FOR FLEXI CONT (MISCELLANEOUS) ×4 IMPLANT
BENZOIN TINCTURE PRP APPL 2/3 (GAUZE/BANDAGES/DRESSINGS) IMPLANT
BINDER BREAST XLRG (GAUZE/BANDAGES/DRESSINGS) IMPLANT
BINDER BREAST XXLRG (GAUZE/BANDAGES/DRESSINGS) ×2 IMPLANT
BLADE HEX COATED 2.75 (ELECTRODE) ×6 IMPLANT
BLADE SURG 15 STRL LF DISP TIS (BLADE) ×4 IMPLANT
BLADE SURG 15 STRL SS (BLADE) ×8
CANISTER SUCT 1200ML W/VALVE (MISCELLANEOUS) ×4 IMPLANT
CHLORAPREP W/TINT 26ML (MISCELLANEOUS) ×8 IMPLANT
CLIP APPLIE 11 MED OPEN (CLIP) ×2 IMPLANT
CLOSURE WOUND 1/2 X4 (GAUZE/BANDAGES/DRESSINGS)
COVER BACK TABLE 60X90IN (DRAPES) ×4 IMPLANT
COVER MAYO STAND STRL (DRAPES) ×4 IMPLANT
COVER PROBE 5X48 (MISCELLANEOUS) ×4
COVER PROBE W GEL 5X96 (DRAPES) ×4 IMPLANT
DECANTER SPIKE VIAL GLASS SM (MISCELLANEOUS) IMPLANT
DERMABOND ADVANCED (GAUZE/BANDAGES/DRESSINGS) ×4
DERMABOND ADVANCED .7 DNX12 (GAUZE/BANDAGES/DRESSINGS) ×2 IMPLANT
DEVICE DUBIN W/COMP PLATE 8390 (MISCELLANEOUS) ×4 IMPLANT
DRAPE C-ARM 42X72 X-RAY (DRAPES) ×4 IMPLANT
DRAPE LAPAROSCOPIC ABDOMINAL (DRAPES) ×6 IMPLANT
DRAPE UTILITY XL STRL (DRAPES) ×6 IMPLANT
DRSG PAD ABDOMINAL 8X10 ST (GAUZE/BANDAGES/DRESSINGS) ×2 IMPLANT
DRSG TEGADERM 2-3/8X2-3/4 SM (GAUZE/BANDAGES/DRESSINGS) IMPLANT
DRSG TEGADERM 4X10 (GAUZE/BANDAGES/DRESSINGS) IMPLANT
DRSG TEGADERM 4X4.75 (GAUZE/BANDAGES/DRESSINGS) IMPLANT
ELECT REM PT RETURN 9FT ADLT (ELECTROSURGICAL) ×4
ELECTRODE REM PT RTRN 9FT ADLT (ELECTROSURGICAL) ×2 IMPLANT
GAUZE SPONGE 4X4 12PLY STRL (GAUZE/BANDAGES/DRESSINGS) ×4 IMPLANT
GAUZE SPONGE 4X4 12PLY STRL LF (GAUZE/BANDAGES/DRESSINGS) IMPLANT
GLOVE BIO SURGEON STRL SZ 6.5 (GLOVE) ×6 IMPLANT
GLOVE BIO SURGEONS STRL SZ 6.5 (GLOVE) ×3
GLOVE BIOGEL PI IND STRL 7.0 (GLOVE) IMPLANT
GLOVE BIOGEL PI INDICATOR 7.0 (GLOVE) ×4
GLOVE EUDERMIC 7 POWDERFREE (GLOVE) ×8 IMPLANT
GOWN STRL REUS W/ TWL LRG LVL3 (GOWN DISPOSABLE) ×4 IMPLANT
GOWN STRL REUS W/ TWL XL LVL3 (GOWN DISPOSABLE) ×2 IMPLANT
GOWN STRL REUS W/TWL LRG LVL3 (GOWN DISPOSABLE) ×12
GOWN STRL REUS W/TWL XL LVL3 (GOWN DISPOSABLE) ×8
ILLUMINATOR WAVEGUIDE N/F (MISCELLANEOUS) IMPLANT
IV CATH PLACEMENT UNIT 16 GA (IV SOLUTION) IMPLANT
IV KIT MINILOC 20X1 SAFETY (NEEDLE) IMPLANT
KIT CVR 48X5XPRB PLUP LF (MISCELLANEOUS) ×2 IMPLANT
KIT MARKER MARGIN INK (KITS) ×4 IMPLANT
KIT PORT POWER 8FR ISP CVUE (Port) ×4 IMPLANT
LIGHT WAVEGUIDE WIDE FLAT (MISCELLANEOUS) IMPLANT
NDL HYPO 25X1 1.5 SAFETY (NEEDLE) ×4 IMPLANT
NDL SAFETY ECLIPSE 18X1.5 (NEEDLE) ×2 IMPLANT
NEEDLE BLUNT 17GA (NEEDLE) IMPLANT
NEEDLE HYPO 18GX1.5 SHARP (NEEDLE) ×4
NEEDLE HYPO 22GX1.5 SAFETY (NEEDLE) ×4 IMPLANT
NEEDLE HYPO 25X1 1.5 SAFETY (NEEDLE) ×8 IMPLANT
NS IRRIG 1000ML POUR BTL (IV SOLUTION) ×4 IMPLANT
PACK BASIN DAY SURGERY FS (CUSTOM PROCEDURE TRAY) ×4 IMPLANT
PAD ALCOHOL SWAB (MISCELLANEOUS) ×2 IMPLANT
PENCIL BUTTON HOLSTER BLD 10FT (ELECTRODE) ×7 IMPLANT
SET SHEATH INTRODUCER 10FR (MISCELLANEOUS) IMPLANT
SHEATH COOK PEEL AWAY SET 9F (SHEATH) IMPLANT
SHEET MEDIUM DRAPE 40X70 STRL (DRAPES) ×4 IMPLANT
SLEEVE SCD COMPRESS KNEE MED (MISCELLANEOUS) ×4 IMPLANT
SPONGE LAP 18X18 RF (DISPOSABLE) IMPLANT
SPONGE LAP 4X18 RFD (DISPOSABLE) ×6 IMPLANT
STRIP CLOSURE SKIN 1/2X4 (GAUZE/BANDAGES/DRESSINGS) IMPLANT
SUT MNCRL AB 4-0 PS2 18 (SUTURE) ×8 IMPLANT
SUT PROLENE 2 0 CT2 30 (SUTURE) ×4 IMPLANT
SUT SILK 2 0 SH (SUTURE) ×4 IMPLANT
SUT VIC AB 2-0 CT1 27 (SUTURE)
SUT VIC AB 2-0 CT1 TAPERPNT 27 (SUTURE) IMPLANT
SUT VIC AB 3-0 54X BRD REEL (SUTURE) IMPLANT
SUT VIC AB 3-0 BRD 54 (SUTURE) ×4
SUT VIC AB 3-0 SH 27 (SUTURE)
SUT VIC AB 3-0 SH 27X BRD (SUTURE) IMPLANT
SUT VICRYL 3-0 CR8 SH (SUTURE) ×4 IMPLANT
SYR 10ML LL (SYRINGE) ×8 IMPLANT
SYR 5ML LUER SLIP (SYRINGE) ×4 IMPLANT
TOWEL GREEN STERILE FF (TOWEL DISPOSABLE) ×8 IMPLANT
TOWEL OR NON WOVEN STRL DISP B (DISPOSABLE) ×4 IMPLANT
TUBE CONNECTING 20'X1/4 (TUBING) ×1
TUBE CONNECTING 20X1/4 (TUBING) ×3 IMPLANT
YANKAUER SUCT BULB TIP NO VENT (SUCTIONS) ×4 IMPLANT

## 2018-08-07 NOTE — Progress Notes (Signed)
Assisted Dr. Ellender with right, ultrasound guided, pectoralis block. Side rails up, monitors on throughout procedure. See vital signs in flow sheet. Tolerated Procedure well. °

## 2018-08-07 NOTE — Op Note (Signed)
Patient Name:           Jennifer Carroll   Date of Surgery:        08/07/2018  Pre op Diagnosis:      Triple negative breast cancer right breast, upper outer quadrant, clinical stage T1c, N0.  Post op Diagnosis:    Same  Procedure:                 Insertion PowerPort [place] 8 French tunneled venous vascular access device                                      Use of fluoroscopy for guidance and positioning                                      Inject blue dye right breast                                      Right breast lumpectomy with radioactive seed localization                                      Right axillary deep sentinel lymph node biopsy  Surgeon:                     Jennifer Carroll, M.D., FACS  Assistant:                      OR staff  Operative Indications:    This is a 52 year old woman, referred by Dr. Miquel Carroll at the [facility] for evaluation of a triple negative breast cancer in the right breast upper outer quadrant.  Jennifer Carroll is her gynecologist. She sees Jennifer Carroll from time to time. She was seen in the [facility]  by Dr. Lindi Carroll, Dr. Lisbeth Carroll, and me.      She has no prior history of breast problems. She gets annual screening mammograms.  Current mammograms and ultrasound showed a category B density. 1.4 cm mass in the right breast upper outer quadrant, far posterior, 10 o'clock position, 20 cm from the nipple. Ultrasound of the right axilla is negative. No signs of cancer elsewhere. 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 Triple negative breast cancer. She has been advised to have chemotherapy and agrees with this.      Past history significant for elevated BMI, hyperlipidemia, hypertension. Family history reveals cousin with pancreatic cancer. Mother has hypertension. Father had heart disease. There is no family history of breast, ovarian, or colon cancer.      We had a long discussion about management of her breast cancer. I discussed the  different treatment lumpectomy and radiation therapy and compare that to mastectomy with or without reconstruction. I advised her to have sentinel lymph node biopsy regardless and described that. She is advised to have Port-A-Cath insertion at the same time discussed that in detail. She agrees with all of this.       She'll be scheduled for right breast lumpectomy with radioactive seed localization, right axillary deep Sentinel lymph node biopsy, inject blue dye right breast, insertion of Port-A-Cath with ultrasound.  Operative Findings:       The Port-A-Cath was inserted through the left subclavian vein and the catheter tip is in the superior vena cava by C arm fluoroscopy.  The catheter flushed well had excellent blood return.  The cancer in the right breast was the far upper lateral posterior breast near the axilla.  I was able to make a single incision to perform the lumpectomy and the sentinel node biopsy.  I found 2 sentinel lymph nodes.  The specimen mammogram looked good, and contained the original marker clip and seed in the center of the specimen  Procedure in Detail:          Patient underwent pectoral block and injection of technetium radionuclide in the holding area.  She was brought to the operating room.  General anesthesia with LMA device was induced.  She was positioned with a soft roll behind her shoulders and her arms tucked at her sides.  The neck and entire chest were then prepped and draped in a sterile fashion.  Surgical timeout was performed.  Intravenous antibiotics were given.  0.5% Marcaine with epinephrine was used as a local infiltration anesthetic.     A left subclavian venipuncture was performed.  Blood return on the first pass.  The wire threaded easily.  Fluoroscopy confirmed that the wire appeared to be in the right atrium.  A small incision was made at the wire insertion site.  A transverse incision was made below the mid to medial clavicle.  Subcutaneous pocket was  created.  Using a tunneling device I passed the catheter from the port pocket site to the wire insertion site.  Using fluoroscopy I drew a template on the chest wall so that the catheter would be in the superior vena cava.  I then measured the catheter and cut it 22.5 cm in length.  The catheter was secured to the port and the port and catheter flushed with heparinized saline.  The port was sutured to the pectoralis fascia with 3 interrupted sutures of 2-0 Prolene.  The dilator and peel-away sheath was inserted over the guidewire without difficulty.  The wire and dilator were removed and the catheter threaded through the peel-away sheath.  The peel-away sheath was removed.  Catheter flushed easily and had excellent blood return.  The catheter was flushed with concentrated heparin.  Fluoroscopy confirmed good positioning of the catheter with the catheter tip in the superior vena cava and no deformity of the catheter anywhere along its course.  Hemostasis was excellent.  The subcutaneous tissue was closed with 3-0 Vicryl and the skin incisions closed with subcuticular 4-0 Monocryl and Dermabond.       We then reprepped and redraped with the patient's arms out to her sides.  The entire chest and chest wall and axilla were then prepped and draped in a sterile fashion.  Using the neoprobe I listened on the technetium and I-131 settings.  I found that I can make a skin crease incision high in the upper outer right breast and perform the lumpectomy and sentinel node through the same incision.  This incision was made with a knife.  The lumpectomy was performed using neoprobe and electrocautery.  The lumpectomy specimen was fairly generous in size and was marked with silk sutures and a 6 color ink kit to orient the pathologist.  Specimen mammogram looked good with the radioactive seed and the marker in the center of the specimen.      I then dissected up into  the axilla and found two sentinel lymph nodes and that was all  we found.  Hemostasis was excellent and achieved with electrocautery, some 3-0 silk sutures and some metal clips.  The wound was copiously irrigated.  I was very satisfied with hemostasis.  I placed 4 metal marker clips in the walls of the lumpectomy cavity.  The incision was closed in 3 layers, 2 internal layers with interrupted 3-0 Vicryl and the skin was closed with a running subcuticular 4-0 Monocryl and Dermabond.  Clean bandages and a breast binder were placed.  The patient was taken to PACU in stable condition.  EBL 40 cc.  Counts correct.  Complications none.  A chest x-ray is planned in PACU.    Addendum: I logged onto the [employer] and reviewed her prescription medication history     Rossy Virag M. Dalbert Carroll, M.D., FACS General and Minimally Invasive Surgery Breast and Colorectal Surgery  08/07/2018 10:16 AM

## 2018-08-07 NOTE — Anesthesia Preprocedure Evaluation (Addendum)
Anesthesia Evaluation  Patient identified by MRN, date of birth, ID band Patient awake    Reviewed: Allergy & Precautions, NPO status , Patient's Chart, lab work & pertinent test results  Airway Mallampati: II  TM Distance: >3 FB Neck ROM: Full    Dental no notable dental hx.    Pulmonary neg pulmonary ROS,    Pulmonary exam normal breath sounds clear to auscultation       Cardiovascular hypertension, Pt. on medications Normal cardiovascular exam Rhythm:Regular Rate:Normal  ECG: NSR, rate 64   Neuro/Psych negative neurological ROS  negative psych ROS   GI/Hepatic negative GI ROS, Neg liver ROS,   Endo/Other  negative endocrine ROS  Renal/GU negative Renal ROS     Musculoskeletal negative musculoskeletal ROS (+)   Abdominal (+) + obese,   Peds  Hematology negative hematology ROS (+) HLD   Anesthesia Other Findings RIGHT BREAST CANCER  Reproductive/Obstetrics                            Anesthesia Physical Anesthesia Plan  ASA: III  Anesthesia Plan: General and Regional   Post-op Pain Management: GA combined w/ Regional for post-op pain   Induction:   PONV Risk Score and Plan: 3 and Midazolam, Dexamethasone, Ondansetron and Treatment may vary due to age or medical condition  Airway Management Planned: LMA  Additional Equipment:   Intra-op Plan:   Post-operative Plan: Extubation in OR  Informed Consent: I have reviewed the patients History and Physical, chart, labs and discussed the procedure including the risks, benefits and alternatives for the proposed anesthesia with the patient or authorized representative who has indicated his/her understanding and acceptance.   Dental advisory given  Plan Discussed with: CRNA  Anesthesia Plan Comments:        Anesthesia Quick Evaluation

## 2018-08-07 NOTE — Transfer of Care (Signed)
Immediate Anesthesia Transfer of Care Note  Patient: Jennifer Carroll  Procedure(s) Performed: INJECT BLUE DYE RIGHT BREAST,RIGHT BREAST LUMPECTOMY WITH RADIOACTIVE SEED AND RIGHT AXILLARY DEEP SENTINEL LYMPH NODE BIOPSY (Right Breast) INSERTION PORT-A-CATH (Left Chest)  Patient Location: PACU  Anesthesia Type:GA combined with regional for post-op pain  Level of Consciousness: awake and patient cooperative  Airway & Oxygen Therapy: Patient Spontanous Breathing and Patient connected to face mask oxygen  Post-op Assessment: Report given to RN and Post -op Vital signs reviewed and stable  Post vital signs: Reviewed and stable  Last Vitals:  Vitals Value Taken Time  BP    Temp    Pulse 97 08/07/2018 10:18 AM  Resp    SpO2 100 % 08/07/2018 10:18 AM  Vitals shown include unvalidated device data.  Last Pain:  Vitals:   08/07/18 0636  TempSrc: Oral  PainSc: 0-No pain         Complications: No apparent anesthesia complications

## 2018-08-07 NOTE — Progress Notes (Signed)
START ON PATHWAY REGIMEN - Breast   Dose-Dense AC q14 days:   A cycle is every 14 days:     Doxorubicin      Cyclophosphamide      [id number]-[id number]   **Always confirm dose/schedule in your pharmacy ordering system**  Paclitaxel 80 mg/m2 Weekly:   Administer weekly:     Paclitaxel   **Always confirm dose/schedule in your pharmacy ordering system**  Patient Characteristics: Postoperative without Neoadjuvant Therapy (Pathologic Staging), Invasive Disease, Adjuvant Therapy, HER2 Negative/Unknown/Equivocal, ER Negative/Unknown, Node Negative, pT1a-c, pN0/N41m or pT2 or Higher, pN0 Therapeutic Status: Postoperative without Neoadjuvant Therapy (Pathologic Staging) AJCC Grade: G2 AJCC N Category: pN0 AJCC M Category: cM0 ER Status: Negative (-) AJCC 8 Stage Grouping: IB HER2 Status: Negative (-) Oncotype Dx Recurrence Score: Not Appropriate AJCC T Category: pT1c PR Status: Negative (-) Intent of Therapy: Curative Intent, Discussed with Patient

## 2018-08-07 NOTE — Interval H&P Note (Signed)
History and Physical Interval Note:  08/07/2018 7:37 AM  Jennifer Carroll  has presented today for surgery, with the diagnosis of RIGHT BREAST CANCER  The various methods of treatment have been discussed with the patient and family. After consideration of risks, benefits and other options for treatment, the patient has consented to  Procedure(s): INJECT BLUE DYE RIGHT BREAST,RIGHT BREAST LUMPECTOMY WITH RADIOACTIVE SEED AND RIGHT AXILLARY DEEP SENTINEL LYMPH NODE BIOPSY (Right) INSERTION PORT-A-CATH WITH [place] (N/A) as a surgical intervention .  The patient's history has been reviewed, patient examined, no change in status, stable for surgery.  I have reviewed the patient's chart and labs.  Questions were answered to the patient's satisfaction.     Adin Hector

## 2018-08-07 NOTE — Anesthesia Procedure Notes (Signed)
Anesthesia Regional Block: Pectoralis block   Pre-Anesthetic Checklist: ,, timeout performed, Correct Patient, Correct Site, Correct Laterality, Correct Procedure, Correct Position, site marked, Risks and benefits discussed,  Surgical consent,  Pre-op evaluation,  At surgeon's request and post-op pain management  Laterality: Right  Prep: chloraprep       Needles:  Injection technique: Single-shot  Needle Type: Echogenic Stimulator Needle     Needle Length: 10cm  Needle Gauge: 21     Additional Needles:   Procedures:,,,, ultrasound used (permanent image in chart),,,,  Narrative:  Start time: 08/07/2018 7:20 AM End time: 08/07/2018 7:30 AM Injection made incrementally with aspirations every 5 mL.  Performed by: Personally  Anesthesiologist: Murvin Natal, MD  Additional Notes: Functioning IV was confirmed and monitors were applied.  A 153mm 21ga P[place]unk echogenic stimulator needle was used. Sterile prep, hand hygiene and sterile gloves were used.  Negative aspiration and negative test dose prior to incremental administration of local anesthetic. The patient tolerated the procedure well.  Ultrasound guidance: relevent anatomy identified, needle position confirmed, local anesthetic spread visualized around nerve(s), vascular puncture avoided.  Image printed for medical record.

## 2018-08-07 NOTE — Anesthesia Postprocedure Evaluation (Signed)
Anesthesia Post Note  Patient: Counsellor  Procedure(s) Performed: INJECT BLUE DYE RIGHT BREAST,RIGHT BREAST LUMPECTOMY WITH RADIOACTIVE SEED AND RIGHT AXILLARY DEEP SENTINEL LYMPH NODE BIOPSY (Right Breast) INSERTION PORT-A-CATH (Left Chest)     Patient location during evaluation: PACU Anesthesia Type: Regional and General Level of consciousness: awake and alert Pain management: pain level controlled Vital Signs Assessment: post-procedure vital signs reviewed and stable Respiratory status: spontaneous breathing, nonlabored ventilation, respiratory function stable and patient connected to nasal cannula oxygen Cardiovascular status: blood pressure returned to baseline and stable Postop Assessment: no apparent nausea or vomiting Anesthetic complications: no    Last Vitals:  Vitals:   08/07/18 1130 08/07/18 1207  BP: 129/86 123/84  Pulse: 85 78  Resp: 17 18  Temp:  36.5 C  SpO2: 93% 98%    Last Pain:  Vitals:   08/07/18 1130  TempSrc:   PainSc: 2                  Emmersen Garraway P Yasmine Kilbourne

## 2018-08-07 NOTE — Anesthesia Procedure Notes (Signed)
Procedure Name: LMA Insertion Date/Time: 08/07/2018 7:57 AM Performed by: Signe Colt, CRNA Pre-anesthesia Checklist: Patient identified, Emergency Drugs available, Suction available and Patient being monitored Patient Re-evaluated:Patient Re-evaluated prior to induction Oxygen Delivery Method: Circle system utilized Preoxygenation: Pre-oxygenation with 100% oxygen Induction Type: IV induction Ventilation: Mask ventilation without difficulty LMA: LMA inserted LMA Size: 4.0 Number of attempts: 1 Airway Equipment and Method: Bite block Placement Confirmation: positive ETCO2 Tube secured with: Tape Dental Injury: Teeth and Oropharynx as per pre-operative assessment

## 2018-08-07 NOTE — Discharge Instructions (Signed)
NO TYLENOL BEFORE  1 pm TODAY!      [place] Office Phone Number [phone]  BREAST BIOPSY/ PARTIAL MASTECTOMY: POST OP INSTRUCTIONS  Always review your discharge instruction sheet given to you by the facility where your surgery was performed.  IF YOU HAVE DISABILITY OR FAMILY LEAVE FORMS, YOU MUST BRING THEM TO THE OFFICE FOR PROCESSING.  DO NOT GIVE THEM TO YOUR DOCTOR.  1. A prescription for pain medication may be given to you upon discharge.  Take your pain medication as prescribed, if needed.  If narcotic pain medicine is not needed, then you may take acetaminophen (Tylenol) or ibuprofen (Advil) as needed. 2. Take your usually prescribed medications unless otherwise directed 3. If you need a refill on your pain medication, please contact your pharmacy.  They will contact our office to request authorization.  Prescriptions will not be filled after 5pm or on week-ends. 4. You should eat very light the first 24 hours after surgery, such as soup, crackers, pudding, etc.  Resume your normal diet the day after surgery. 5. Most patients will experience some swelling and bruising in the breast.  Ice packs and a good support bra will help.  Swelling and bruising can take several days to resolve.  6. It is common to experience some constipation if taking pain medication after surgery.  Increasing fluid intake and taking a stool softener will usually help or prevent this problem from occurring.  A mild laxative (Milk of Magnesia or Miralax) should be taken according to package directions if there are no bowel movements after 48 hours. 7. Unless discharge instructions indicate otherwise, you may remove your bandages 24-48 hours after surgery, and you may shower at that time.  You may have steri-strips (small skin tapes) in place directly over the incision.  These strips should be left on the skin for 7-10 days.  If your surgeon used skin glue on the incision, you may shower in  24 hours.  The glue will flake off over the next 2-3 weeks.  Any sutures or staples will be removed at the office during your follow-up visit. 8. ACTIVITIES:  You may resume regular daily activities (gradually increasing) beginning the next day.  Wearing a good support bra or sports bra minimizes pain and swelling.  You may have sexual intercourse when it is comfortable. a. You may drive when you no longer are taking prescription pain medication, you can comfortably wear a seatbelt, and you can safely maneuver your car and apply brakes. b. RETURN TO WORK:  ______________________________________________________________________________________ 9. You should see your doctor in the office for a follow-up appointment approximately two weeks after your surgery.  Your doctors nurse will typically make your follow-up appointment when she calls you with your pathology report.  Expect your pathology report 2-3 business days after your surgery.  You may call to check if you do not hear from [place] after three days. 10. OTHER INSTRUCTIONS: _______________________________________________________________________________________________ _____________________________________________________________________________________________________________________________________ _____________________________________________________________________________________________________________________________________ _____________________________________________________________________________________________________________________________________  WHEN TO CALL YOUR DOCTOR: 1. Fever over 101.0 2. Nausea and/or vomiting. 3. Extreme swelling or bruising. 4. Continued bleeding from incision. 5. Increased pain, redness, or drainage from the incision.  The clinic staff is available to answer your questions during regular business hours.  Please dont hesitate to call and ask to speak to one of the nurses for clinical concerns.  If you have  a medical emergency, go to the nearest emergency room or call 911.  A surgeon from [facility] is always  on call at the hospital.  For further questions, please visit [email or web address]              PORT-A-CATH: POST OP INSTRUCTIONS  Always review your discharge instruction sheet given to you by the facility where your surgery was performed.   1. A prescription for pain medication may be given to you upon discharge. Take your pain medication as prescribed, if needed. If narcotic pain medicine is not needed, then you make take acetaminophen (Tylenol) or ibuprofen (Advil) as needed.  2. Take your usually prescribed medications unless otherwise directed. 3. If you need a refill on your pain medication, please contact our office. All narcotic pain medicine now requires a paper prescription.  Phoned in and fax refills are no longer allowed by law.  Prescriptions will not be filled after 5 pm or on weekends.  4. You should follow a light diet for the remainder of the day after your procedure. 5. Most patients will experience some mild swelling and/or bruising in the area of the incision. It may take several days to resolve. 6. It is common to experience some constipation if taking pain medication after surgery. Increasing fluid intake and taking a stool softener (such as Colace) will usually help or prevent this problem from occurring. A mild laxative (Milk of Magnesia or Miralax) should be taken according to package directions if there are no bowel movements after 48 hours.  7. Unless discharge instructions indicate otherwise, you may remove your bandages 48 hours after surgery, and you may shower at that time. You may have steri-strips (small white skin tapes) in place directly over the incision.  These strips should be left on the skin for 7-10 days.  If your surgeon used Dermabond (skin glue) on the incision, you may shower in 24 hours.  The glue will flake off over the  next 2-3 weeks.  8. If your port is left accessed at the end of surgery (needle left in port), the dressing cannot get wet and should only by changed by a healthcare professional. When the port is no longer accessed (when the needle has been removed), follow step 7.   9. ACTIVITIES:  Limit activity involving your arms for the next 72 hours. Do no strenuous exercise or activity for 1 week. You may drive when you are no longer taking prescription pain medication, you can comfortably wear a seatbelt, and you can maneuver your car. 10.You may need to see your doctor in the office for a follow-up appointment.  Please       check with your doctor.  11.When you receive a new Port-a-Cath, you will get a product guide and        ID card.  Please keep them in case you need them.  WHEN TO CALL YOUR DOCTOR [phone]): 1. Fever over 101.0 2. Chills 3. Continued bleeding from incision 4. Increased redness and tenderness at the site 5. Shortness of breath, difficulty breathing   The clinic staff is available to answer your questions during regular business hours. Please dont hesitate to call and ask to speak to one of the nurses or medical assistants for clinical concerns. If you have a medical emergency, go to the nearest emergency room or call 911.  A surgeon from [facility] is always on call at the hospital.     For further information, please visit [email or web address]       Post Anesthesia Home Care Instructions  Activity: Get plenty of rest for the remainder  of the day. A responsible individual must stay with you for 24 hours following the procedure.  For the next 24 hours, DO NOT: -Drive a car -Paediatric nurse -Drink alcoholic beverages -Take any medication unless instructed by your physician -Make any legal decisions or sign important papers.  Meals: Start with liquid foods such as gelatin or soup. Progress to regular foods as tolerated. Avoid greasy,  spicy, heavy foods. If nausea and/or vomiting occur, drink only clear liquids until the nausea and/or vomiting subsides. Call your physician if vomiting continues.  Special Instructions/Symptoms: Your throat may feel dry or sore from the anesthesia or the breathing tube placed in your throat during surgery. If this causes discomfort, gargle with warm salt water. The discomfort should disappear within 24 hours.  If you had a scopolamine patch placed behind your ear for the management of post- operative nausea and/or vomiting:  1. The medication in the patch is effective for 72 hours, after which it should be removed.  Wrap patch in a tissue and discard in the trash. Wash hands thoroughly with soap and water. 2. You may remove the patch earlier than 72 hours if you experience unpleasant side effects which may include dry mouth, dizziness or visual disturbances. 3. Avoid touching the patch. Wash your hands with soap and water after contact with the patch.

## 2018-08-08 ENCOUNTER — Encounter (HOSPITAL_BASED_OUTPATIENT_CLINIC_OR_DEPARTMENT_OTHER): Payer: Self-pay | Admitting: General Surgery

## 2018-08-10 ENCOUNTER — Encounter: Payer: Self-pay | Admitting: Genetics

## 2018-08-14 ENCOUNTER — Inpatient Hospital Stay: Payer: BLUE CROSS/BLUE SHIELD

## 2018-08-14 ENCOUNTER — Encounter: Payer: Self-pay | Admitting: *Deleted

## 2018-08-14 ENCOUNTER — Ambulatory Visit (HOSPITAL_COMMUNITY)
Admission: RE | Admit: 2018-08-14 | Discharge: 2018-08-14 | Disposition: A | Discharge status: Home / Self Care | Payer: BLUE CROSS/BLUE SHIELD | Source: Ambulatory Visit | Attending: Hematology and Oncology | Admitting: Hematology and Oncology

## 2018-08-14 ENCOUNTER — Encounter: Payer: Self-pay | Admitting: Genetics

## 2018-08-14 ENCOUNTER — Inpatient Hospital Stay: Payer: BLUE CROSS/BLUE SHIELD | Admitting: Genetics

## 2018-08-14 ENCOUNTER — Inpatient Hospital Stay: Payer: BLUE CROSS/BLUE SHIELD | Attending: Hematology and Oncology | Admitting: Hematology and Oncology

## 2018-08-14 DIAGNOSIS — Z171 Estrogen receptor negative status [ER-]: Secondary | ICD-10-CM

## 2018-08-14 DIAGNOSIS — Z800 Family history of malignant neoplasm of digestive organs: Secondary | ICD-10-CM

## 2018-08-14 DIAGNOSIS — R21 Rash and other nonspecific skin eruption: Secondary | ICD-10-CM | POA: Diagnosis not present

## 2018-08-14 DIAGNOSIS — C50411 Malignant neoplasm of upper-outer quadrant of right female breast: Secondary | ICD-10-CM

## 2018-08-14 DIAGNOSIS — Z1501 Genetic susceptibility to malignant neoplasm of breast: Secondary | ICD-10-CM

## 2018-08-14 DIAGNOSIS — Z79899 Other long term (current) drug therapy: Secondary | ICD-10-CM | POA: Diagnosis not present

## 2018-08-14 DIAGNOSIS — Z23 Encounter for immunization: Secondary | ICD-10-CM | POA: Diagnosis not present

## 2018-08-14 DIAGNOSIS — L539 Erythematous condition, unspecified: Secondary | ICD-10-CM

## 2018-08-14 DIAGNOSIS — Z1589 Genetic susceptibility to other disease: Secondary | ICD-10-CM

## 2018-08-14 DIAGNOSIS — Z1502 Genetic susceptibility to malignant neoplasm of ovary: Secondary | ICD-10-CM | POA: Insufficient documentation

## 2018-08-14 DIAGNOSIS — Z5111 Encounter for antineoplastic chemotherapy: Secondary | ICD-10-CM

## 2018-08-14 DIAGNOSIS — Z1509 Genetic susceptibility to other malignant neoplasm: Secondary | ICD-10-CM

## 2018-08-14 HISTORY — DX: Genetic susceptibility to malignant neoplasm of breast: Z15.02

## 2018-08-14 HISTORY — DX: Genetic susceptibility to malignant neoplasm of breast: Z15.01

## 2018-08-14 LAB — ECHOCARDIOGRAM COMPLETE
HEIGHTINCHES: 66.5 in
Weight: 3793.6 oz

## 2018-08-14 MED ORDER — CEPHALEXIN 500 MG PO CAPS: 500.0000 mg | ORAL_CAPSULE | Freq: Four times a day (QID) | ORAL | 0 refills | Status: DC

## 2018-08-14 MED ORDER — NYSTATIN 100000 UNIT/GM EX POWD: Freq: Four times a day (QID) | CUTANEOUS | 0 refills | Status: DC

## 2018-08-14 NOTE — Assessment & Plan Note (Signed)
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  Pathology counseling: I discussed the final pathology report of the patient provided  a copy of this report. I discussed the margins as well as lymph node surgeries. We also discussed the final staging along with previously performed ER/PR and HER-2/neu testing.  Treatment plan: 1. Adjuvant chemotherapy with dose dense Adriamycin and Cytoxan x4 followed by Taxol weekly x12 2.  Adjuvant radiation therapy  UPBEAT clinical trial ([id number]): Newly diagnosed stage I to III breast cancer patients receiving either adjuvant or neoadjuvant chemotherapy undergo cardiac MRI before treatment and at 24 months along with neurocognitive testing, exercise and disability measures at baseline 3, 12 and 24 months.  Return to clinic in 3 weeks to start chemotherapy.

## 2018-08-14 NOTE — Progress Notes (Addendum)
HPI:  Jennifer Carroll was previously seen in the [place] clinic on 07/26/2018 due to a personal and family history of cancer and concerns regarding a hereditary predisposition to cancer. Please refer to our prior cancer genetics clinic note for more information regarding Jennifer Carroll's medical, social and family histories, and our assessment and recommendations, at the time. Jennifer Carroll recent genetic test results were disclosed to her, as well as recommendations warranted by these results. These results and recommendations are discussed in more detail below.  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  The Hereditary Gene Panel offered by [facility] includes sequencing and/or deletion duplication testing of the following 47 genes: APC, ATM, AXIN2, BARD1, BMPR1A, BRCA1, BRCA2, BRIP1, CDH1, CDK4, CDKN2A (p14ARF), CDKN2A (p16INK4a), CHEK2, CTNNA1, DICER1, EPCAM (Deletion/duplication testing only), GREM1 (promoter region deletion/duplication testing only), KIT, MEN1, MLH1, MSH2, MSH3, MSH6, MUTYH, NBN, NF1, NHTL1, PALB2, PDGFRA, PMS2, POLD1, POLE, PTEN, RAD50, RAD51C, RAD51D, SDHB, SDHC, SDHD, SMAD4, SMARCA4. STK11, TP53, TSC1, TSC2, and VHL.  The following genes were evaluated for sequence changes only: SDHA and HOXB13 c.251G>A variant only. The report date is August 06, 2018.    08/07/2018 Surgery    Right lumpectomy: IDC grade 2, 1.5 cm, with intermediate grade DCIS, margins negative, 0/3 lymph nodes negative, triple negative with Ki-67 of 50%, T1  CN 0 stage IB      FAMILY HISTORY:  We obtained a detailed, 4-generation family history.  Significant diagnoses are listed below: Family History  Problem Relation Age of Onset  . Hypertension Mother   . Kidney disease Mother   . Heart disease Father   . Hypertension Brother   . Kidney failure Maternal Grandfather   . Pancreatic cancer Cousin        dx >50    Ms.  Carroll has a 60 year-old daughter and a 39 year-old son with no history of cancer. Jennifer Carroll has 3 sisters who died shortly after birth or as a todler.  Jennifer Carroll has a brother who is 6 with no history of cancer.  Jennifer Carroll also reports a paternal half-brother who had lung cancer.  He has a daughter.   Jennifer Carroll father: died at 65, no history of cancer Paternal aunts/Uncles: 1 paternal aunt died in her 80's with no history of cancer.  Paternal cousins: no history of cancer Paternal grandfather: unk, died before patient was born Paternal grandmother:died in her 72's/90's cause unk  Jennifer Carroll's mother: died at 79 due to kidney failure.  She had a hysterectomy in her lifetime.  Maternal Aunts/Uncles: 6 maternal aunts/uncles, no history of cancer.  Maternal cousins: 1 maternal cousin died of pancreatic cancer dx >50 Maternal grandfather: died at 51 due to kidney disease Maternal grandmother:unk, died before patient was born  Jennifer Carroll is unaware of previous family history of genetic testing for hereditary cancer risks. Patient's maternal ancestors are of Cacuasian/German descent, and paternal ancestors are of Caucasian/German descent. There is no reported Ashkenazi Jewish ancestry. There is no known consanguinity.  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 c.77T>C (p.Ile26Thr).    The Common Hereditary Cancer Panel offered by [facility] includes sequencing and/or deletion duplication testing of the following 47 genes: APC, ATM, AXIN2, BARD1, BMPR1A, BRCA1, BRCA2, BRIP1, CDH1, CDKN2A (p14ARF), CDKN2A (p16INK4a), CKD4, CHEK2, CTNNA1, DICER1, EPCAM (Deletion/duplication testing only), GREM1 (promoter region deletion/duplication testing only), KIT, MEN1, MLH1, MSH2, MSH3, MSH6, MUTYH, NBN, NF1, NHTL1, PALB2, PDGFRA, PMS2, POLD1, POLE, PTEN, RAD50, RAD51C, RAD51D, SDHB, SDHC, SDHD, SMAD4, SMARCA4. STK11, TP53, TSC1, TSC2, and VHL.  The following genes were evaluated for sequence changes only: SDHA and HOXB13 c.251G>A variant only.   The test report will be scanned into EPIC and will be located under the Molecular Pathology section of the Results Review tab. A portion of the result report is included below for reference.     We discussed with [name] that because current genetic testing is not perfect, it is possible there may be a gene mutation in one of these genes that current testing cannot detect, but that chance is small.  We also discussed, that there could be another gene that has not yet been discovered, or that we have not yet tested, that is responsible for the cancer diagnoses in the family.  NBN gene:  The normal function of the NBN gene is to help protect our bodies from cancer; the NBN works with other genes in a complex and is important in the repair of DNA that becomes damaged during normal DNA replication. We all inherit two copies of the NBN gene, one from each parent. If there is a specific change (mutation) in the NBN gene that stops the gene from working properly, this may inhibit the cell's ability to correct mistakes in the DNA, thereby permitting more mutations to accumulate, which can increase individual's risk to develop cancer in their lifetime.   Peer-reviewed research suggests that women who have inherited a mutation  in the NBN gene have a moderately increased lifetime risk of developing breast cancer over that of a woman in the general population.  Current studies suggest there is an approximately 2.7 fold increased risk.  (most of the data on this gene comes from studies of the slavic founder mutation [phone], and it is unknown if other variants confer the same risks). The risk for a 2nd primary breast cancer may be elevated, but is currently not well defined.   It has also been suggested that mutations in NBN may also be associated with prostate and other cancers, however these risks are not well defined.   Men who have inherited an NBN mutation may be at a slightly increased risk for prostate cancer, but data is not conclusive and there are no current recommendations for increased prostate cancer screening over that of the general population.  It is currently unknown how this mutation affects female breast cancer risk.  Further research about the NBN gene is expected to be available in the coming years, so recommendations may evolve.   Recommendations for Cancer Screening:  Current national guidelines [employer] (NCCN) guidelines) suggest the following screening for breast surveillance:   Annual mammogram and also consider annual breast MRI with contrast at age 57  The evidence is insufficient to recommend a risk reducing mastectomy, unless the risk  for breast cancer based on family history warrants discussion.   Additionally, there is no evidence to suggest an increased risk for ovarian cancer.   We recommend Ms. Bailey discuss breast screening options with her oncologist, Dr. [name]. An individual's cancer risk is not determined by genetic test results alone.  Overall cancer risk assessment includes additional factors such as personal medical history, family history, etc.  These should be used to make a personalized plan for cancer prevention and surveillance   Regarding  the VUS in BRCA1: At this time, it is unknown if this variant is associated with increased cancer risk or if this is a normal finding, but most variants such as this get reclassified to being inconsequential. It should not be used to make medical management decisions. With time, we suspect the lab will determine the significance of this variant, if any. If we do learn more about it, we will try to contact Ms. Pulsifer to discuss it further. However, it is important to stay in touch with [place] periodically and keep the address and phone number up to date.  We contacted another major laboratory, [facility], and they informed us they have only seen this variant once and are also classifying this as a variant of uncertain significance at this time.   RECOMMENDATIONS FOR FAMILY MEMBERS:   Ms. Klosowski children and siblings have a 50% chance to have also inherited this NBN mutation.  Other relatives such as aunts/uncles/cousins, parents, etc are also at risk to have inherited this NBN mutation.  We recommend Ms. Telleria inform all of her relatives (at this time we do not know if this was inherited from her mother or father's side of the family) about this mutation and that they consider genetic testing.  We would not recommend testing her son until he is an adult.    [place] Breakage Syndrome:  People who have 2 NBN mutations have a more severe, childhood onset genetic condition called [place] breakage syndrome.  People with [place] breakage syndrome often have short stature, intelectual disability, increased cancer risk, and other health problems. MS. Kirkey only has 1 NBN mutation and does NOT have this disease.  However, if 2 people who both carry 1 NBN mutation have children together, there would be a risk for their children to have this disease.  If Ms. Fussner and/or her family members are considering having children in the future, they could discuss this reproductive risk with their healthcare providers  or a Dietitian.  PLAN:  1.  Ms. Hendriks will follow-up with her oncologist Dr. [name] regarding breast screening and discussion about high risk breast screening protocol (mammogram + MR).   2.  Ms. Jepsen plans to discuss these results with her relatives.  Her brother Jannette Fogo attended this appointment with her and also pursued genetic testing for the familial NBN mutation.  She will let us know if we can be of assistance in testing other family members. She gives permission to discuss her genetic test results and family history with her brother Gwyndolyn Saxon.   FOLLOW-UP: Lastly, we discussed with Ms. Giannone that cancer genetics is a rapidly advancing field and it is possible that new genetic tests will be appropriate for her and/or her family members in the future. We encouraged her to remain in contact with cancer genetics on an annual basis so we can update her personal and family histories and let her know of advances in cancer genetics that may benefit this family.   Our contact number  was provided. Ms. Neuwirth questions were answered to her satisfaction, and she knows she is welcome to call us at anytime with additional questions or concerns.   Ferol Luz, MS, [facility] Certified Genetic Counselor [email or web address]  The patient was seen for a total of 20 minutes in face-to-face genetic counseling.  This patient was discussed with Drs. Magrinat, [name] and/or Burr Medico who agrees with the above. She was accompanied by her brother Jannette Fogo, her husband, and her sister-in-law.

## 2018-08-14 NOTE — Progress Notes (Signed)
Patient Care Team: [name], MD as PCP - General (Family Medicine) Fanny Skates, MD as Consulting Physician (General Surgery) Nicholas Lose, MD as Consulting Physician (Hematology and Oncology) Kyung Rudd, MD as Consulting Physician (Radiation Oncology)  DIAGNOSIS:  Encounter Diagnosis  Name Primary?  . 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  The Hereditary Gene Panel offered by [facility] includes sequencing and/or deletion duplication testing of the following 47 genes: APC, ATM, AXIN2, BARD1, BMPR1A, BRCA1, BRCA2, BRIP1, CDH1, CDK4, CDKN2A (p14ARF), CDKN2A (p16INK4a), CHEK2, CTNNA1, DICER1, EPCAM (Deletion/duplication testing only), GREM1 (promoter region deletion/duplication testing only), KIT, MEN1, MLH1, MSH2, MSH3, MSH6, MUTYH, NBN, NF1, NHTL1, PALB2, PDGFRA, PMS2, POLD1, POLE, PTEN, RAD50, RAD51C, RAD51D, SDHB, SDHC, SDHD, SMAD4, SMARCA4. STK11, TP53, TSC1, TSC2, and VHL.  The following genes were evaluated for sequence changes only: SDHA and HOXB13 c.251G>A variant only. The report date is August 06, 2018.    08/07/2018 Surgery    Right lumpectomy: IDC grade 2, 1.5 cm, with intermediate grade DCIS, margins negative, 0/3 lymph nodes negative, triple negative with Ki-67 of 50%, T1 CN 0 stage IB     CHIEF COMPLIANT: Follow-up after recent lumpectomy  INTERVAL HISTORY: Jennifer Carroll is a 52 year old lady with  above-mentioned history of right breast cancer treated with lumpectomy and is here today to discuss the results.  She is recovering very well from the surgery.  She has some discomfort in the axilla.  She complains of a erythematous area on her feet on both legs but worse on the left side with a rash in the skin peeling.  There is no discharge.  There is no foul smell.  She also complains of a rash between her breasts.  REVIEW OF SYSTEMS:   Constitutional: Denies fevers, chills or abnormal weight loss Eyes: Denies blurriness of vision Ears, nose, mouth, throat, and face: Denies mucositis or sore throat Respiratory: Denies cough, dyspnea or wheezes Cardiovascular: Denies palpitation, chest discomfort Gastrointestinal:  Denies nausea, heartburn or change in bowel habits Skin: Erythematous rash on both her feet Lymphatics: Denies new lymphadenopathy or easy bruising Neurological:Denies numbness, tingling or new weaknesses Behavioral/Psych: Mood is stable, no new changes  Extremities: No lower extremity edema Breast: Rash between her breasts All other systems were reviewed with the patient and are negative.  I have reviewed the past medical history, past surgical history, social history and family history with the patient and they are unchanged from previous note.  ALLERGIES:  has No Known Allergies.  MEDICATIONS:  Current Outpatient Medications  Medication Sig Dispense Refill  . HYDROcodone-acetaminophen (NORCO) 5-325 MG tablet Take 1-2 tablets by mouth every 6 (six) hours as needed for moderate pain or severe pain. 20 tablet 0  . ibuprofen (ADVIL,MOTRIN) 200 MG tablet Take 200 mg by mouth every 6 (six) hours as needed. Ibuprofen 200 mg 2 tablets As needed.    . lidocaine-prilocaine (EMLA) cream Apply to affected area once 30 g 3  .  LORazepam (ATIVAN) 0.5 MG tablet Take 1 tablet (0.5 mg total) by mouth at bedtime as needed for sleep. 30 tablet 0  . LOSARTAN POTASSIUM PO Take 50 mg by mouth  daily.    . Multiple Vitamin (MULTI-VITAMIN DAILY PO) Take by mouth.    . norethindrone (CAMILA) 0.35 MG tablet Take 1 tablet (0.35 mg total) by mouth daily. 3 Package 3  . ondansetron (ZOFRAN) 8 MG tablet Take 1 tablet (8 mg total) by mouth 2 (two) times daily as needed. Start on the third day after chemotherapy. 30 tablet 1  . prochlorperazine (COMPAZINE) 10 MG tablet Take 1 tablet (10 mg total) by mouth every 6 (six) hours as needed (Nausea or vomiting). 30 tablet 1  . simvastatin (ZOCOR) 20 MG tablet Take 20 mg by mouth every evening.     No current facility-administered medications for this visit.     PHYSICAL EXAMINATION: ECOG PERFORMANCE STATUS: 1 - Symptomatic but completely ambulatory  Vitals:   08/14/18 0925  BP: (!) 132/96  Pulse: 93  Resp: 18  Temp: 98.6 F (37 C)  SpO2: 98%   Filed Weights   08/14/18 0925  Weight: 237 lb 1.6 oz (107.5 kg)    GENERAL:alert, no distress and comfortable SKIN: skin color, texture, turgor are normal, no rashes or significant lesions EYES: normal, Conjunctiva are pink and non-injected, sclera clear OROPHARYNX:no exudate, no erythema and lips, buccal mucosa, and tongue normal  NECK: supple, thyroid normal size, non-tender, without nodularity LYMPH:  no palpable lymphadenopathy in the cervical, axillary or inguinal LUNGS: clear to auscultation and percussion with normal breathing effort HEART: regular rate & rhythm and no murmurs and no lower extremity edema ABDOMEN:abdomen soft, non-tender and normal bowel sounds MUSCULOSKELETAL:no cyanosis of digits and no clubbing  NEURO: alert & oriented x 3 with fluent speech, no focal motor/sensory deficits EXTREMITIES: Erythematous rash on her feet especially the medial toes   LABORATORY DATA:  I have reviewed the data as listed CMP Latest Ref Rng & Units 07/25/2018  Glucose 70 - 99 mg/dL 112(H)  BUN 6 - 20 mg/dL 15  Creatinine 0.44 - 1.00 mg/dL 1.07(H)  Sodium 135 - 145 mmol/L 142  Potassium  3.5 - 5.1 mmol/L 4.3  Chloride 98 - 111 mmol/L 107  CO2 22 - 32 mmol/L 24  Calcium 8.9 - 10.3 mg/dL 9.1  Total Protein 6.5 - 8.1 g/dL 7.5  Total Bilirubin 0.3 - 1.2 mg/dL 0.3  Alkaline Phos 38 - 126 U/L 99  AST 15 - 41 U/L 14(L)  ALT 0 - 44 U/L 16    Lab Results  Component Value Date   WBC 4.2 07/25/2018   HGB 13.4 07/25/2018   HCT 40.6 07/25/2018   MCV 89.6 07/25/2018   PLT 207 07/25/2018   NEUTROABS 2.3 07/25/2018    ASSESSMENT & PLAN:  Malignant neoplasm of upper-outer quadrant of right breast in female, estrogen receptor negative ([place]) 08/07/2018:Right lumpectomy: IDC grade 2, 1.5 cm, with intermediate grade DCIS, margins negative, 0/3 lymph nodes negative, triple negative with Ki-67 of 50%, T1 CN 0 stage IB Patient works at [employer] and can work from home if necessary.  Pathology counseling: I discussed the final pathology report of the patient provided  a copy of this report. I discussed the margins as well as lymph node surgeries. We also discussed the final staging along with previously performed ER/PR and HER-2/neu testing.  Treatment plan: 1. Adjuvant chemotherapy with dose dense Adriamycin and Cytoxan x4 followed by Taxol  weekly x12 2.  Adjuvant radiation therapy  UPBEAT clinical trial ([id number]): Newly diagnosed stage I to III breast cancer patients receiving either adjuvant or neoadjuvant chemotherapy undergo cardiac MRI before treatment and at 24 months along with neurocognitive testing, exercise and disability measures at baseline 3, 12 and 24 months.  Erythema on her feet: I gave her prescription for Keflex. Rash between her breasts: Looks like a fungal reaction, I prescribed her nystatin powder.  Return to clinic in 3 weeks to start chemotherapy.    No orders of the defined types were placed in this encounter.  The patient has a good understanding of the overall plan. she agrees with it. she will call with any problems that may develop before the  next visit here.   Harriette Ohara, MD 08/14/18

## 2018-08-16 ENCOUNTER — Encounter: Payer: Self-pay | Admitting: Hematology and Oncology

## 2018-08-17 NOTE — Progress Notes (Signed)
FMLA successfully faxed to [employer] at [phone]. Mailed copy to patient address on file.

## 2018-08-20 MED FILL — LOSARTAN POTASSIUM 50 MG TA: 50 | 30 days supply | Qty: 30 | Fill #0

## 2018-08-21 ENCOUNTER — Encounter: Payer: Self-pay | Admitting: Hematology and Oncology

## 2018-08-22 ENCOUNTER — Telehealth: Payer: Self-pay

## 2018-08-22 ENCOUNTER — Encounter: Payer: Self-pay | Admitting: Hematology and Oncology

## 2018-08-22 ENCOUNTER — Other Ambulatory Visit: Payer: Self-pay | Admitting: Oncology

## 2018-08-22 ENCOUNTER — Other Ambulatory Visit: Payer: Self-pay

## 2018-08-22 ENCOUNTER — Telehealth: Payer: Self-pay | Admitting: Oncology

## 2018-08-22 MED ORDER — NYSTATIN 100000 UNIT/GM EX POWD: Freq: Four times a day (QID) | CUTANEOUS | 1 refills | Status: DC

## 2018-08-22 NOTE — Telephone Encounter (Signed)
Appts scheduled patient notified per 9/5 sch msg

## 2018-08-22 NOTE — Telephone Encounter (Signed)
Pt called to find out if she can get another refill on her keflex and nystatin powder for fungal infection of her toes. Pt states that she sent mychart pictures with before/ after photos of her toes. The first picture looks reddened and swollen. The second picture shows no swelling, with peeling, redness and drying of the skin.  Pt wants to make sure that her infection resolves prior to starting first time chemo, on 9/25.   Per Dr.Gudena, no need to refill keflex at this time. Pt may continue to take nystatin powder for breast rash and also apply on toes, until completely better. Pt encourage to cleanse feet and keep it dry. Pt may soak foot in vinegar and water to help with healing. Pt advised to monitor for any swelling, pain, or drainage and to report as soon as possible. Pt verbalized understanding and has no further questions. [facility]

## 2018-08-22 NOTE — Telephone Encounter (Signed)
Gudena patient. Entered under [facility] in error/ tcv

## 2018-08-24 ENCOUNTER — Encounter: Payer: Self-pay | Admitting: Hematology and Oncology

## 2018-08-27 ENCOUNTER — Encounter: Payer: Self-pay | Admitting: Adult Health

## 2018-08-27 ENCOUNTER — Encounter: Payer: Self-pay | Admitting: Hematology and Oncology

## 2018-08-28 ENCOUNTER — Encounter: Payer: Self-pay | Admitting: Hematology and Oncology

## 2018-08-28 NOTE — Progress Notes (Signed)
Called pt to introduce myself as her Arboriculturist and to discuss copay assistance.  Pt gave me consent to apply in her behalf so I applied to the [employer] [place] but she was denied because she exceeds their income requirement.  I will enroll her in the [place] [employer] for Udenyca.  Pt is also overqualified for the [employer].  I will give her my card at her next visit for any questions or concerns she may have in the future.

## 2018-08-29 ENCOUNTER — Encounter: Payer: Self-pay | Admitting: Hematology and Oncology

## 2018-08-29 DIAGNOSIS — L304 Erythema intertrigo: Secondary | ICD-10-CM | POA: Diagnosis not present

## 2018-08-30 ENCOUNTER — Ambulatory Visit: Payer: BLUE CROSS/BLUE SHIELD | Attending: General Surgery | Admitting: Physical Therapy

## 2018-08-30 ENCOUNTER — Telehealth: Payer: Self-pay | Admitting: Adult Health

## 2018-08-30 ENCOUNTER — Other Ambulatory Visit: Payer: Self-pay

## 2018-08-30 ENCOUNTER — Encounter: Payer: Self-pay | Admitting: Physical Therapy

## 2018-08-30 ENCOUNTER — Other Ambulatory Visit: Payer: Self-pay | Admitting: Adult Health

## 2018-08-30 DIAGNOSIS — C50411 Malignant neoplasm of upper-outer quadrant of right female breast: Secondary | ICD-10-CM | POA: Diagnosis not present

## 2018-08-30 DIAGNOSIS — R293 Abnormal posture: Secondary | ICD-10-CM

## 2018-08-30 DIAGNOSIS — Z171 Estrogen receptor negative status [ER-]: Secondary | ICD-10-CM | POA: Diagnosis not present

## 2018-08-30 DIAGNOSIS — Z483 Aftercare following surgery for neoplasm: Secondary | ICD-10-CM | POA: Insufficient documentation

## 2018-08-30 NOTE — Therapy (Signed)
[place], [place], [address] Phone: [phone]   Fax:  [phone]  Physical Therapy Treatment  Patient Details  Name: Jennifer Carroll MRN: [id number] Date of Birth: 05/21/1966 Referring Provider: Dr. Fanny Skates   Encounter Date: 08/30/2018  PT End of Session - 08/30/18 1049    Visit Number  2    Number of Visits  2    PT Start Time  1012    PT Stop Time  1048    PT Time Calculation (min)  36 min    Activity Tolerance  Patient tolerated treatment well    Behavior During Therapy  [facility] for tasks assessed/performed       Past Medical History:  Diagnosis Date  . AMA (advanced maternal age) multigravida 35+ 03/28/05   with no amniocentesis  . 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 Cancer [facility])    Right breast   . Family history of pancreatic cancer   . H/O infertility   . H/O menorrhagia   . History of chicken pox   . History of measles, mumps, or rubella   . Hyperlipidemia   . Hypertension   . Low iron   . Menses, irregular   . 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   Jennifer Carroll     Past Surgical History:  Procedure Laterality Date  . BREAST LUMPECTOMY WITH RADIOACTIVE SEED AND SENTINEL LYMPH NODE BIOPSY Right 08/07/2018   Procedure: INJECT BLUE DYE RIGHT BREAST,RIGHT BREAST LUMPECTOMY WITH RADIOACTIVE SEED AND RIGHT AXILLARY DEEP SENTINEL LYMPH NODE BIOPSY;  Surgeon: Fanny Skates, MD;  Location: [place];  Service: General;  Laterality: Right;  . EYE SURGERY     left eye at age 78  . head surgery     to cover hole in head at age of  68 yrs young  . PORTACATH PLACEMENT Left 08/07/2018   Procedure: INSERTION PORT-A-CATH;  Surgeon: Fanny Skates, MD;  Location: [place];  Service: General;  Laterality: Left;   . WISDOM TOOTH EXTRACTION  2001    There were no vitals filed for this visit.  Subjective Assessment - 08/30/18 1014    Subjective  Patient underwent a right lumpectomy and sentinel node biopsy (0/3 positive) on 08/07/18 for triple negative breast cancer. Chemotherapy begins 09/05/18 for 5 months followed by radiation.    Pertinent History  Patient was diagnosed on 07/02/18 with right triple negative grade II invasive ductal carcinoma breast cancer in the upper outer quadrant. The Ki67 is 50%. Patient underwent a right lumpectomy and sentinel node biopsy (0/3 positive) on 08/07/18 for triple negative breast cancer. Chemotherapy begins 09/05/18 for 5 months followed by radiation.    Patient Stated Goals  See if my arm is ok    Currently in Pain?  No/denies   Mild tingling in right axilla        [facility] PT Assessment - 08/30/18 0001      Assessment   Medical Diagnosis  Right breast cancer s/p lumpectomy and SLNB    Referring Provider  Dr. Fanny Skates    Onset Date/Surgical Date  08/07/18    Hand Dominance  Right    Prior Therapy  Baselines      Precautions   Precautions  Other (comment)    Precaution Comments  recent surgery  Restrictions   Weight Bearing Restrictions  No      Balance Screen   Has the patient fallen in the past 6 months  No    Has the patient had a decrease in activity level because of a fear of falling?   No    Is the patient reluctant to leave their home because of a fear of falling?   No      Home Environment   Living Environment  Private residence    Living Arrangements  Spouse/significant other;Children   Husband, 22 and 46 y.o. kids   Available Help at Discharge  Family      Prior Function   Level of Independence  Independent    Vocation  Full time employment    Vocation Requirements  Works for [employer] on [employer] 30 min/day      Cognition   Overall Cognitive Status  Within Functional Limits for tasks assessed       Observation/Other Assessments   Observations  Incision appears to be healing well. There is some hardness and edema present just inferior to her incision. Pt issued compression foam in stockinette to try to reduce hardness and swelling.      Posture/Postural Control   Posture/Postural Control  Postural limitations    Postural Limitations  Rounded Shoulders;Forward head      ROM / Strength   AROM / PROM / Strength  AROM      AROM   AROM Assessment Site  Shoulder    Right/Left Shoulder  Right    Right Shoulder Extension  52 Degrees    Right Shoulder Flexion  138 Degrees    Right Shoulder ABduction  148 Degrees    Right Shoulder Internal Rotation  75 Degrees    Right Shoulder External Rotation  75 Degrees        LYMPHEDEMA/ONCOLOGY QUESTIONNAIRE - 08/30/18 1020      Type   Cancer Type  Right breast cancer      Surgeries   Lumpectomy Date  08/07/18    Sentinel Lymph Node Biopsy Date  08/07/18    Number Lymph Nodes Removed  3      Treatment   Active Chemotherapy Treatment  No    Past Chemotherapy Treatment  No    Active Radiation Treatment  No    Past Radiation Treatment  No    Current Hormone Treatment  No    Past Hormone Therapy  No      What other symptoms do you have   Are you Having Heaviness or Tightness  No    Are you having Pain  No    Are you having pitting edema  No    Is it Hard or Difficult finding clothes that fit  No    Do you have infections  No      Lymphedema Assessments   Lymphedema Assessments  Upper extremities      Right Upper Extremity Lymphedema   10 cm Proximal to Olecranon Process  32.4 cm    Olecranon Process  27.9 cm    10 cm Proximal to Ulnar Styloid Process  25.1 cm    Just Proximal to Ulnar Styloid Process  17.6 cm    Across Hand at [employer]  19.3 cm    At [place] of 2nd Digit  6.2 cm      Left Upper Extremity Lymphedema   10 cm Proximal to Olecranon Process  33.2 cm  Olecranon Process  27.5 cm    10 cm Proximal to Ulnar  Styloid Process  25 cm    Just Proximal to Ulnar Styloid Process  18.2 cm    Across Hand at [employer]  19.3 cm    At [place] of 2nd Digit  6.3 cm        Quick Dash - 08/30/18 0001    Open a tight or new jar  No difficulty    Do heavy household chores (wash walls, wash floors)  No difficulty    Carry a shopping bag or briefcase  No difficulty    Wash your back  No difficulty    Use a knife to cut food  No difficulty    Recreational activities in which you take some force or impact through your arm, shoulder, or hand (golf, hammering, tennis)  Mild difficulty    During the past week, to what extent has your arm, shoulder or hand problem interfered with your normal social activities with family, friends, neighbors, or groups?  Not at all    During the past week, to what extent has your arm, shoulder or hand problem limited your work or other regular daily activities  Not at all    Arm, shoulder, or hand pain.  Mild    Tingling (pins and needles) in your arm, shoulder, or hand  Mild    Difficulty Sleeping  Mild difficulty    DASH Score  9.09 %                     PT Education - 08/30/18 1048    Education Details  Shoulder ROM HEP and lymphedema risk reduction; importance of ABC class; wearing compression foam in bra to reduce edema    Person(s) Educated  Patient    Methods  Explanation;Handout    Comprehension  Returned demonstration;Verbalized understanding          PT Long Term Goals - 08/30/18 1055      PT LONG TERM GOAL #1   Title  Patient will demonstrate she has returned to baseline post operatively related ot shoulder ROM and function.    Time  8    Period  Weeks    Status  Achieved            Plan - 08/30/18 1049    Clinical Impression Statement  Patient is doing well s/p right lumpectomy and sentinel node biopsy (0/3 positive nodes) on 08/07/18. She will begin chemo next week followed by radiation. Her ROM and shoulder function has returned to  baseline except flexion is very slightly limited but was educated on a new exercise to improve that.  She has no signs of lymphedema but will benefit from attending the ABC class as she is at risk. No other need for PT at this time.    Rehab Potential  Excellent    PT Treatment/Interventions  ADLs/Self Care Home Management;Patient/family education;Therapeutic exercise    PT Next Visit Plan  D/C    PT Home Exercise Plan  Post op shoulder ROM HEP    Consulted and Agree with Plan of Care  Patient       Patient will benefit from skilled therapeutic intervention in order to improve the following deficits and impairments:  Decreased range of motion, Postural dysfunction, Decreased knowledge of precautions, Pain, Impaired UE functional use  Visit Diagnosis: Malignant neoplasm of upper-outer quadrant of right breast in female, estrogen receptor negative ([facility])  Abnormal posture  Aftercare following surgery for neoplasm     Problem List Patient Active Problem List   Diagnosis Date Noted  . 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 Genetic testing 08/06/2018  . Family history of pancreatic cancer   . Malignant neoplasm of upper-outer quadrant of right breast in female, estrogen receptor negative ([place]) 07/18/2018  . Irregular menses 05/08/2012   PHYSICAL THERAPY DISCHARGE SUMMARY  Visits from Start of Care: 2  Current functional level related to goals / functional outcomes: Goals met; pt doing very well.   Remaining deficits: Lacking 10 degrees of right shoulder flexion.   Education / Equipment: Lymphedema risk reduction practices and HEP  Plan: Patient agrees to discharge.  Patient goals were met. Patient is being discharged due to meeting the stated rehab goals.  ?????         Annia Friendly, [place] 08/30/18 10:57 AM   [place], [place], [address] Phone: [phone]   Fax:   [phone]  Name: Jaleigh Mccroskey MRN: [id number] Date of Birth: [birth year]

## 2018-08-30 NOTE — Telephone Encounter (Signed)
Called and spoke with tyrone about patient Udenyca denial.  Spent 15 minutes on the phone reviewing the reason for denial and that is that patient needs to receive in MD office or at home, not in an outpatient setting.  Due to no availability in the [place] office, I cannot do peer to peer today, no available times tomorrow to talk either. Scheduled for peer to peer Monday at 3pm with Dr. Joyce Gross.  Was informed that the 3pm time is only tentative and that he could call anywhere between 3pm and 330pm to review.    Concern voiced over amount of time the process is taking for patient to receive standard of care.    Time spent on phone to schedule peer to peer: 15 minutes.  Wilber Bihari, NP

## 2018-08-30 NOTE — Patient Instructions (Signed)
Closed Chain: Shoulder Flexion / Extension - on Wall    Hands on wall, step backward. Return. Stepping causes shoulder flexion and extension Do _5__ times, holding 5-10 seconds, _2-3__ times per day.  http://ss.exer.us/265   Copyright  VHI. All rights reserved.   

## 2018-09-03 NOTE — Progress Notes (Signed)
Updated patient FMLA paperwork to reflect patient was out of work 2 weeks following surgery on 08/07/18. Patient returned to work on 08/21/18, and will need intermittent time off work once chemotherapy starts on 09/05/18.  Successfully faxed paperwork to [facility] at [phone].

## 2018-09-04 ENCOUNTER — Telehealth: Payer: Self-pay | Admitting: Adult Health

## 2018-09-04 DIAGNOSIS — B353 Tinea pedis: Secondary | ICD-10-CM | POA: Diagnosis not present

## 2018-09-04 NOTE — Telephone Encounter (Signed)
Reviewed patient Udenyca denial with Dr. Alveria Apley.  He informed me that the request has to be entered as cost of medication and not as outpatient infusion center rate.  Insurance will not pay the infusion center rates for injection as they are 3-4 times the cost of the medication.  He told me that the patient can receive it at an outpatient office not associated with the hospital, or as a home RN visit, or even self adminstered.  He could not give me any places in the area that could help [place] with this administration issue.  I reviewed my concern about certain patients self administering the medication, and he compared it to diabetes and whether or not patients take their insulin, it was their responsibility.    His # is [employer], NP

## 2018-09-04 NOTE — Telephone Encounter (Signed)
Received voice mail yesterday around 330pm from doctor for peer to peer.  Did not receive call back number to call him back at, and also received no patient information to be able to look up information.    Case information:  [employer] #- [phone]  Jennifer Carroll  DOB Nov 25, 1966  Member ID # - [id number]   Call made today.  On hold for 16 minutes.  Was informed to call Dr. Alveria Apley directly.  Called him at [phone].  Left message on his machine.  At approximately 1040.    Wilber Bihari, NP

## 2018-09-05 ENCOUNTER — Encounter: Payer: BLUE CROSS/BLUE SHIELD | Admitting: Physical Therapy

## 2018-09-05 ENCOUNTER — Inpatient Hospital Stay: Payer: BLUE CROSS/BLUE SHIELD

## 2018-09-05 ENCOUNTER — Encounter: Payer: Self-pay | Admitting: *Deleted

## 2018-09-05 ENCOUNTER — Encounter: Payer: Self-pay | Admitting: Adult Health

## 2018-09-05 ENCOUNTER — Encounter: Payer: Self-pay | Admitting: Genetics

## 2018-09-05 ENCOUNTER — Inpatient Hospital Stay (HOSPITAL_BASED_OUTPATIENT_CLINIC_OR_DEPARTMENT_OTHER): Payer: BLUE CROSS/BLUE SHIELD | Admitting: Adult Health

## 2018-09-05 VITALS — BP 130/87 | HR 78 | Temp 97.9°F | Resp 17 | Ht 66.5 in | Wt 238.7 lb

## 2018-09-05 VITALS — BP 122/81 | HR 76 | Resp 16

## 2018-09-05 DIAGNOSIS — Z79899 Other long term (current) drug therapy: Secondary | ICD-10-CM | POA: Diagnosis not present

## 2018-09-05 DIAGNOSIS — C50411 Malignant neoplasm of upper-outer quadrant of right female breast: Secondary | ICD-10-CM

## 2018-09-05 DIAGNOSIS — R21 Rash and other nonspecific skin eruption: Secondary | ICD-10-CM | POA: Diagnosis not present

## 2018-09-05 DIAGNOSIS — Z23 Encounter for immunization: Secondary | ICD-10-CM

## 2018-09-05 DIAGNOSIS — Z5111 Encounter for antineoplastic chemotherapy: Secondary | ICD-10-CM

## 2018-09-05 DIAGNOSIS — Z171 Estrogen receptor negative status [ER-]: Secondary | ICD-10-CM

## 2018-09-05 DIAGNOSIS — L539 Erythematous condition, unspecified: Secondary | ICD-10-CM | POA: Diagnosis not present

## 2018-09-05 DIAGNOSIS — Z95828 Presence of other vascular implants and grafts: Secondary | ICD-10-CM

## 2018-09-05 LAB — CBC WITH DIFFERENTIAL (CANCER CENTER ONLY)
Basophils Absolute: 0 10*3/uL (ref 0.0–0.1)
Basophils Relative: 1 %
Eosinophils Absolute: 0.1 10*3/uL (ref 0.0–0.5)
Eosinophils Relative: 2 %
HEMATOCRIT: 40 % (ref 34.8–46.6)
Hemoglobin: 13.3 g/dL (ref 11.6–15.9)
LYMPHS PCT: 34 %
Lymphs Abs: 1.2 10*3/uL (ref 0.9–3.3)
MCH: 30.1 pg (ref 25.1–34.0)
MCHC: 33.4 g/dL (ref 31.5–36.0)
MCV: 90.1 fL (ref 79.5–101.0)
MONO ABS: 0.5 10*3/uL (ref 0.1–0.9)
MONOS PCT: 13 %
NEUTROS ABS: 1.9 10*3/uL (ref 1.5–6.5)
Neutrophils Relative %: 50 %
Platelet Count: 202 10*3/uL (ref 145–400)
RBC: 4.44 MIL/uL (ref 3.70–5.45)
RDW: 14.9 % — AB (ref 11.2–14.5)
WBC Count: 3.7 10*3/uL — ABNORMAL LOW (ref 3.9–10.3)

## 2018-09-05 LAB — CMP (CANCER CENTER ONLY)
ALBUMIN: 3.2 g/dL — AB (ref 3.5–5.0)
ALT: 16 U/L (ref 0–44)
AST: 16 U/L (ref 15–41)
Alkaline Phosphatase: 91 U/L (ref 38–126)
Anion gap: 6 (ref 5–15)
BUN: 16 mg/dL (ref 6–20)
CALCIUM: 8.1 mg/dL — AB (ref 8.9–10.3)
CO2: 24 mmol/L (ref 22–32)
Chloride: 112 mmol/L — ABNORMAL HIGH (ref 98–111)
Creatinine: 0.78 mg/dL (ref 0.44–1.00)
GFR, Est AFR Am: >60 mL/min (ref >60)
GFR, Estimated: >60 mL/min (ref >60)
GLUCOSE: 81 mg/dL (ref 70–99)
POTASSIUM: 3.8 mmol/L (ref 3.5–5.1)
SODIUM: 142 mmol/L (ref 135–145)
Total Bilirubin: 0.3 mg/dL (ref 0.3–1.2)
Total Protein: 6.3 g/dL — ABNORMAL LOW (ref 6.5–8.1)

## 2018-09-05 MED ORDER — SODIUM CHLORIDE 0.9 % IV SOLN
600.0000 mg/m2 | Freq: Once | INTRAVENOUS | Status: AC
  Administered 2018-09-05: 1340 mg via INTRAVENOUS
  Filled 2018-09-05: qty 67

## 2018-09-05 MED ORDER — PALONOSETRON HCL INJECTION 0.25 MG/5ML
INTRAVENOUS | Status: AC
  Filled 2018-09-05: qty 5

## 2018-09-05 MED ORDER — DOXORUBICIN HCL CHEMO IV INJECTION 2 MG/ML
60.0000 mg/m2 | Freq: Once | INTRAVENOUS | Status: AC
  Administered 2018-09-05: 134 mg via INTRAVENOUS
  Filled 2018-09-05: qty 67

## 2018-09-05 MED ORDER — HEPARIN SOD (PORK) LOCK FLUSH 100 UNIT/ML IV SOLN
500.0000 [IU] | Freq: Once | INTRAVENOUS | Status: AC | PRN
  Administered 2018-09-05: 500 [IU]
  Filled 2018-09-05: qty 5

## 2018-09-05 MED ORDER — SODIUM CHLORIDE 0.9% FLUSH
10.0000 mL | INTRAVENOUS | Status: DC | PRN
  Filled 2018-09-05: qty 10

## 2018-09-05 MED ORDER — INFLUENZA VAC SPLIT QUAD 0.5 ML IM SUSY
0.5000 mL | PREFILLED_SYRINGE | Freq: Once | INTRAMUSCULAR | Status: AC
  Administered 2018-09-05: 0.5 mL via INTRAMUSCULAR

## 2018-09-05 MED ORDER — SODIUM CHLORIDE 0.9% FLUSH
10.0000 mL | INTRAVENOUS | Status: DC | PRN
  Administered 2018-09-05 (×2): 10 mL via INTRAVENOUS
  Filled 2018-09-05: qty 10

## 2018-09-05 MED ORDER — SODIUM CHLORIDE 0.9 % IV SOLN
Freq: Once | INTRAVENOUS | Status: AC
  Administered 2018-09-05: 09:00:00 via INTRAVENOUS
  Filled 2018-09-05: qty 250

## 2018-09-05 MED ORDER — PALONOSETRON HCL INJECTION 0.25 MG/5ML
0.2500 mg | Freq: Once | INTRAVENOUS | Status: AC
  Administered 2018-09-05: 0.25 mg via INTRAVENOUS

## 2018-09-05 MED ORDER — SODIUM CHLORIDE 0.9 % IV SOLN
Freq: Once | INTRAVENOUS | Status: AC
  Administered 2018-09-05: 10:00:00 via INTRAVENOUS
  Filled 2018-09-05: qty 5

## 2018-09-05 MED ORDER — INFLUENZA VAC SPLIT QUAD 0.5 ML IM SUSY
PREFILLED_SYRINGE | INTRAMUSCULAR | Status: AC
  Filled 2018-09-05: qty 0.5

## 2018-09-05 NOTE — Assessment & Plan Note (Addendum)
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  Pathology counseling: I discussed the final pathology report of the patient provided  a copy of this report. I discussed the margins as well as lymph node surgeries. We also discussed the final staging along with previously performed ER/PR and HER-2/neu testing.  Treatment plan: 1. Adjuvant chemotherapy with dose dense Adriamycin and Cytoxan x4 followed by Taxol weekly x12 2.  Adjuvant radiation therapy  UPBEAT clinical trial ([id number]): Newly diagnosed stage I to III breast cancer patients receiving either adjuvant or neoadjuvant chemotherapy undergo cardiac MRI before treatment and at 24 months along with neurocognitive testing, exercise and disability measures at baseline 3, 12 and 24 months.  _________________________________________________________________________________________  Jennifer Carroll is doing well today.  Her CBC is normal and I reviewed it with her in detail.  She understands how to take her anti nausea medications.  She will proceed with her first cycle of Adriamycin/Cytoxan.  I recommended against her taking the Ketoconazole due to the interaction and its potential to increase the Doxorubicin effects on her.  She will contact her PCP for an alternate medication.    We also reviewed her insurance denial of the Udenyca administration after cycle 1 at the infusion center.  I have notified Dr. Montey Hora in pharmacy, and Gaspar Bidding about this issue.  We will work on finding a quick resolution for cycles 2-4.    Jennifer Carroll will return in one week for labs and f/u with Dr. [name].

## 2018-09-05 NOTE — Patient Instructions (Signed)
Rule Discharge Instructions for Patients Receiving Chemotherapy  Today you received the following chemotherapy agents Adriamycin and Cytoxan  To help prevent nausea and vomiting after your treatment, we encourage you to take your nausea medication as prescribed.   If you develop nausea and vomiting that is not controlled by your nausea medication, call the clinic.   BELOW ARE SYMPTOMS THAT SHOULD BE REPORTED IMMEDIATELY:  *FEVER GREATER THAN 100.5 F  *CHILLS WITH OR WITHOUT FEVER  NAUSEA AND VOMITING THAT IS NOT CONTROLLED WITH YOUR NAUSEA MEDICATION  *UNUSUAL SHORTNESS OF BREATH  *UNUSUAL BRUISING OR BLEEDING  TENDERNESS IN MOUTH AND THROAT WITH OR WITHOUT PRESENCE OF ULCERS  *URINARY PROBLEMS  *BOWEL PROBLEMS  UNUSUAL RASH Items with * indicate a potential emergency and should be followed up as soon as possible.  Feel free to call the clinic should you have any questions or concerns. The clinic phone number is [phone].  Please show the [facility] at check-in to the Emergency Department and triage nurse.  Doxorubicin injection (Adriamycin) What is this medicine? DOXORUBICIN (dox oh ROO bi sin) is a chemotherapy drug. It is used to treat many kinds of cancer like leukemia, lymphoma, neuroblastoma, sarcoma, and Wilms' tumor. It is also used to treat bladder cancer, breast cancer, lung cancer, ovarian cancer, stomach cancer, and thyroid cancer. This medicine may be used for other purposes; ask your health care provider or pharmacist if you have questions. COMMON BRAND NAME(S): Adriamycin, Adriamycin PFS, Adriamycin RDF, Rubex What should I tell my health care provider before I take this medicine? They need to know if you have any of these conditions: -heart disease -history of low blood counts caused by a medicine -liver disease -recent or ongoing radiation therapy -an unusual or allergic reaction to doxorubicin, other chemotherapy agents,  other medicines, foods, dyes, or preservatives -pregnant or trying to get pregnant -breast-feeding How should I use this medicine? This drug is given as an infusion into a vein. It is administered in a hospital or clinic by a specially trained health care professional. If you have pain, swelling, burning or any unusual feeling around the site of your injection, tell your health care professional right away. Talk to your pediatrician regarding the use of this medicine in children. Special care may be needed. Overdosage: If you think you have taken too much of this medicine contact a poison control center or emergency room at once. NOTE: This medicine is only for you. Do not share this medicine with others. What if I miss a dose? It is important not to miss your dose. Call your doctor or health care professional if you are unable to keep an appointment. What may interact with this medicine? This medicine may interact with the following medications: -6-mercaptopurine -paclitaxel -phenytoin -St. John's Wort -trastuzumab -verapamil This list may not describe all possible interactions. Give your health care provider a list of all the medicines, herbs, non-prescription drugs, or dietary supplements you use. Also tell them if you smoke, drink alcohol, or use illegal drugs. Some items may interact with your medicine. What should I watch for while using this medicine? This drug may make you feel generally unwell. This is not uncommon, as chemotherapy can affect healthy cells as well as cancer cells. Report any side effects. Continue your course of treatment even though you feel ill unless your doctor tells you to stop. There is a maximum amount of this medicine you should receive throughout your life. The amount depends  on the medical condition being treated and your overall health. Your doctor will watch how much of this medicine you receive in your lifetime. Tell your doctor if you have taken this  medicine before. You may need blood work done while you are taking this medicine. Your urine may turn red for a few days after your dose. This is not blood. If your urine is dark or brown, call your doctor. In some cases, you may be given additional medicines to help with side effects. Follow all directions for their use. Call your doctor or health care professional for advice if you get a fever, chills or sore throat, or other symptoms of a cold or flu. Do not treat yourself. This drug decreases your body's ability to fight infections. Try to avoid being around people who are sick. This medicine may increase your risk to bruise or bleed. Call your doctor or health care professional if you notice any unusual bleeding. Talk to your doctor about your risk of cancer. You may be more at risk for certain types of cancers if you take this medicine. Do not become pregnant while taking this medicine or for 6 months after stopping it. Women should inform their doctor if they wish to become pregnant or think they might be pregnant. Men should not father a child while taking this medicine and for 6 months after stopping it. There is a potential for serious side effects to an unborn child. Talk to your health care professional or pharmacist for more information. Do not breast-feed an infant while taking this medicine. This medicine has caused ovarian failure in some women and reduced sperm counts in some men This medicine may interfere with the ability to have a child. Talk with your doctor or health care professional if you are concerned about your fertility. What side effects may I notice from receiving this medicine? Side effects that you should report to your doctor or health care professional as soon as possible: -allergic reactions like skin rash, itching or hives, swelling of the face, lips, or tongue -breathing problems -chest pain -fast or irregular heartbeat -low blood counts - this medicine may  decrease the number of white blood cells, red blood cells and platelets. You may be at increased risk for infections and bleeding. -pain, redness, or irritation at site where injected -signs of infection - fever or chills, cough, sore throat, pain or difficulty passing urine -signs of decreased platelets or bleeding - bruising, pinpoint red spots on the skin, black, tarry stools, blood in the urine -swelling of the ankles, feet, hands -tiredness -weakness Side effects that usually do not require medical attention (report to your doctor or health care professional if they continue or are bothersome): -diarrhea -hair loss -mouth sores -nail discoloration or damage -nausea -red colored urine -vomiting This list may not describe all possible side effects. Call your doctor for medical advice about side effects. You may report side effects to FDA at 1-800-FDA-1088. Where should I keep my medicine? This drug is given in a hospital or clinic and will not be stored at home. NOTE: This sheet is a summary. It may not cover all possible information. If you have questions about this medicine, talk to your doctor, pharmacist, or health care provider.  2018 [employer] Standard (2016-01-25 11:28:51)   Cyclophosphamide injection (Cytoxan) What is this medicine? CYCLOPHOSPHAMIDE (sye kloe FOSS fa mide) is a chemotherapy drug. It slows the growth of cancer cells. This medicine is used to treat many types of  cancer like lymphoma, myeloma, leukemia, breast cancer, and ovarian cancer, to name a few. This medicine may be used for other purposes; ask your health care provider or pharmacist if you have questions. COMMON BRAND NAME(S): Cytoxan, Neosar What should I tell my health care provider before I take this medicine? They need to know if you have any of these conditions: -blood disorders -history of other chemotherapy -infection -kidney disease -liver disease -recent or ongoing radiation  therapy -tumors in the bone marrow -an unusual or allergic reaction to cyclophosphamide, other chemotherapy, other medicines, foods, dyes, or preservatives -pregnant or trying to get pregnant -breast-feeding How should I use this medicine? This drug is usually given as an injection into a vein or muscle or by infusion into a vein. It is administered in a hospital or clinic by a specially trained health care professional. Talk to your pediatrician regarding the use of this medicine in children. Special care may be needed. Overdosage: If you think you have taken too much of this medicine contact a poison control center or emergency room at once. NOTE: This medicine is only for you. Do not share this medicine with others. What if I miss a dose? It is important not to miss your dose. Call your doctor or health care professional if you are unable to keep an appointment. What may interact with this medicine? This medicine may interact with the following medications: -amiodarone -amphotericin B -azathioprine -certain antiviral medicines for HIV or AIDS such as protease inhibitors (e.g., indinavir, ritonavir) and zidovudine -certain blood pressure medications such as benazepril, captopril, enalapril, fosinopril, lisinopril, moexipril, monopril, perindopril, quinapril, ramipril, trandolapril -certain cancer medications such as anthracyclines (e.g., daunorubicin, doxorubicin), busulfan, cytarabine, paclitaxel, pentostatin, tamoxifen, trastuzumab -certain diuretics such as chlorothiazide, chlorthalidone, hydrochlorothiazide, indapamide, metolazone -certain medicines that treat or prevent blood clots like warfarin -certain muscle relaxants such as succinylcholine -cyclosporine -etanercept -indomethacin -medicines to increase blood counts like filgrastim, pegfilgrastim, sargramostim -medicines used as general anesthesia -metronidazole -natalizumab This list may not describe all possible  interactions. Give your health care provider a list of all the medicines, herbs, non-prescription drugs, or dietary supplements you use. Also tell them if you smoke, drink alcohol, or use illegal drugs. Some items may interact with your medicine. What should I watch for while using this medicine? Visit your doctor for checks on your progress. This drug may make you feel generally unwell. This is not uncommon, as chemotherapy can affect healthy cells as well as cancer cells. Report any side effects. Continue your course of treatment even though you feel ill unless your doctor tells you to stop. Drink water or other fluids as directed. Urinate often, even at night. In some cases, you may be given additional medicines to help with side effects. Follow all directions for their use. Call your doctor or health care professional for advice if you get a fever, chills or sore throat, or other symptoms of a cold or flu. Do not treat yourself. This drug decreases your body's ability to fight infections. Try to avoid being around people who are sick. This medicine may increase your risk to bruise or bleed. Call your doctor or health care professional if you notice any unusual bleeding. Be careful brushing and flossing your teeth or using a toothpick because you may get an infection or bleed more easily. If you have any dental work done, tell your dentist you are receiving this medicine. You may get drowsy or dizzy. Do not drive, use machinery, or do anything that  needs mental alertness until you know how this medicine affects you. Do not become pregnant while taking this medicine or for 1 year after stopping it. Women should inform their doctor if they wish to become pregnant or think they might be pregnant. Men should not father a child while taking this medicine and for 4 months after stopping it. There is a potential for serious side effects to an unborn child. Talk to your health care professional or pharmacist for  more information. Do not breast-feed an infant while taking this medicine. This medicine may interfere with the ability to have a child. This medicine has caused ovarian failure in some women. This medicine has caused reduced sperm counts in some men. You should talk with your doctor or health care professional if you are concerned about your fertility. If you are going to have surgery, tell your doctor or health care professional that you have taken this medicine. What side effects may I notice from receiving this medicine? Side effects that you should report to your doctor or health care professional as soon as possible: -allergic reactions like skin rash, itching or hives, swelling of the face, lips, or tongue -low blood counts - this medicine may decrease the number of white blood cells, red blood cells and platelets. You may be at increased risk for infections and bleeding. -signs of infection - fever or chills, cough, sore throat, pain or difficulty passing urine -signs of decreased platelets or bleeding - bruising, pinpoint red spots on the skin, black, tarry stools, blood in the urine -signs of decreased red blood cells - unusually weak or tired, fainting spells, lightheadedness -breathing problems -dark urine -dizziness -palpitations -swelling of the ankles, feet, hands -trouble passing urine or change in the amount of urine -weight gain -yellowing of the eyes or skin Side effects that usually do not require medical attention (report to your doctor or health care professional if they continue or are bothersome): -changes in nail or skin color -hair loss -missed menstrual periods -mouth sores -nausea, vomiting This list may not describe all possible side effects. Call your doctor for medical advice about side effects. You may report side effects to FDA at 1-800-FDA-1088. Where should I keep my medicine? This drug is given in a hospital or clinic and will not be stored at  home. NOTE: This sheet is a summary. It may not cover all possible information. If you have questions about this medicine, talk to your doctor, pharmacist, or health care provider.  2018 [employer]/Gold Standard (2012-10-12 16:22:58)

## 2018-09-05 NOTE — Progress Notes (Signed)
[facility] Follow up:    Jennifer Neer, MD [address] [place] [address]   DIAGNOSIS: Cancer Staging Malignant neoplasm of upper-outer quadrant of right breast in female, estrogen receptor negative ([place]) Staging form: Breast, AJCC 8th Edition - Clinical: Stage IB (cT1c, cN0, cM0, G2, ER-, PR-, HER2-) - Unsigned - Pathologic stage from 08/07/2018: Stage IB (pT1c, pN0, cM0, G2, ER-, PR-, HER2-) - Signed by Gardenia Phlegm, NP on 08/22/2018   SUMMARY OF ONCOLOGIC HISTORY:   Malignant neoplasm of upper-outer quadrant of right breast in female, estrogen receptor negative ([place])   07/13/2018 Initial Diagnosis    Screening detected right breast asymmetry UOQ 10 o'clock position 1.4 cm, axilla negative, biopsy revealed grade 2 IDC triple negative with a Ki-67 of 50% T1c N0 stage Ib AJCC 8    08/06/2018 Genetic Testing    NBN c.2117C>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  The Hereditary Gene Panel offered by [facility] includes sequencing and/or deletion duplication testing of the following 47 genes: APC, ATM, AXIN2, BARD1, BMPR1A, BRCA1, BRCA2, BRIP1, CDH1, CDK4, CDKN2A (p14ARF), CDKN2A (p16INK4a), CHEK2, CTNNA1, DICER1, EPCAM (Deletion/duplication testing only), GREM1 (promoter region deletion/duplication testing only), KIT, MEN1, MLH1, MSH2, MSH3, MSH6, MUTYH, NBN, NF1, NHTL1, PALB2, PDGFRA, PMS2, POLD1, POLE, PTEN, RAD50, RAD51C, RAD51D, SDHB, SDHC, SDHD, SMAD4, SMARCA4. STK11, TP53, TSC1, TSC2, and VHL.  The following genes were evaluated for sequence changes only: SDHA and HOXB13 c.251G>A variant only. The report date is August 06, 2018.    08/07/2018 Surgery    Right lumpectomy: IDC grade 2, 1.5 cm, with intermediate grade DCIS, margins negative, 0/3 lymph nodes negative, triple negative with Ki-67 of 50%, T1 CN 0 stage IB    08/07/2018 Cancer Staging    Staging form:  Breast, AJCC 8th Edition - Pathologic stage from 08/07/2018: Stage IB (pT1c, pN0, cM0, G2, ER-, PR-, HER2-) - Signed by Gardenia Phlegm, NP on 08/22/2018     CURRENT THERAPY: Adriamycin/Cytoxan  INTERVAL HISTORY: Baron Sane 51 y.o. female returns for evaluation prior to starting Adriamycin/Cytoxan.  She is doing.  She denies any fevers, chills, or any other concerns.  She does have fungal infection and she is seeing her PCP about this, Dr. Harle Battiest.  She was prescribed oral ketoconazole with this.  She wants to know if she can take that with receiving chemotherapy.     Patient Active Problem List   Diagnosis Date Noted  . 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 Genetic testing 08/06/2018  . Family history of pancreatic cancer   . Malignant neoplasm of upper-outer quadrant of right breast in female, estrogen receptor negative ([place]) 07/18/2018  . Irregular menses 05/08/2012    has No Known Allergies.  MEDICAL HISTORY: Past Medical History:  Diagnosis Date  . AMA (advanced maternal age) multigravida 35+ 03/28/05   with no amniocentesis  . 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 Cancer [facility])    Right breast   . Family history of pancreatic cancer   . H/O infertility   . H/O menorrhagia   . History of chicken pox   . History of measles, mumps, or rubella   . Hyperlipidemia   . Hypertension   . Low iron   . Menses, irregular   . 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   [name]     SURGICAL HISTORY: Past Surgical History:  Procedure Laterality Date  . BREAST LUMPECTOMY WITH RADIOACTIVE SEED AND SENTINEL LYMPH NODE BIOPSY Right 08/07/2018   Procedure: INJECT BLUE DYE RIGHT BREAST,RIGHT BREAST LUMPECTOMY WITH RADIOACTIVE SEED AND RIGHT AXILLARY DEEP SENTINEL LYMPH NODE BIOPSY;  Surgeon: Fanny Skates,  MD;  Location: [facility];  Service: General;  Laterality: Right;  . EYE SURGERY     left eye at age 52  . head surgery     to cover hole in head at age of  31 yrs young  . PORTACATH PLACEMENT Left 08/07/2018   Procedure: INSERTION PORT-A-CATH;  Surgeon: Fanny Skates, MD;  Location: [facility];  Service: General;  Laterality: Left;  . WISDOM TOOTH EXTRACTION  2001    SOCIAL HISTORY: Social History   Socioeconomic History  . Marital status: Married    Spouse name: Not on file  . Number of children: Not on file  . Years of education: Not on file  . Highest education level: Not on file  Occupational History  . Not on file  Social Needs  . Financial resource strain: Not on file  . Food insecurity:    Worry: Not on file    Inability: Not on file  . Transportation needs:    Medical: Not on file    Non-medical: Not on file  Tobacco Use  . Smoking status: Never Smoker  . Smokeless tobacco: Never Used  Substance and Sexual Activity  . Alcohol use: Not Currently  . Drug use: Never  . Sexual activity: Yes  Lifestyle  . Physical activity:    Days per week: Not on file    Minutes per session: Not on file  . Stress: Not on file  Relationships  . Social connections:    Talks on phone: Not on file    Gets together: Not on file    Attends religious service: Not on file    Active member of club or organization: Not on file    Attends meetings of clubs or organizations: Not on file    Relationship status: Not on file  . Intimate partner violence:    Fear of current or ex partner: Not on file    Emotionally abused: Not on file    Physically abused: Not on file    Forced sexual activity: Not on file  Other Topics Concern  . Not on file  Social History Narrative  . Not on file    FAMILY HISTORY: Family History  Problem Relation Age of Onset  . Hypertension Mother   . Kidney disease Mother   . Heart disease Father   . Hypertension Brother   .  Kidney failure Maternal Grandfather   . Pancreatic cancer Cousin        dx >50    Review of Systems  Constitutional: Negative for appetite change, chills, fatigue, fever and unexpected weight change.  HENT:   Negative for hearing loss, lump/mass, sore throat and voice change.   Eyes: Negative for eye problems and icterus.  Respiratory: Negative for chest tightness, cough and shortness of breath.   Cardiovascular: Negative for chest pain, leg swelling and palpitations.  Gastrointestinal: Negative for abdominal distention, abdominal pain, constipation, diarrhea, nausea and vomiting.  Endocrine: Negative for hot flashes.  Skin: Negative for itching and rash.  Neurological: Negative for dizziness, extremity weakness, headaches and numbness.  Hematological: Negative for adenopathy.  Psychiatric/Behavioral:  Negative for depression. The patient is not nervous/anxious.       PHYSICAL EXAMINATION  ECOG PERFORMANCE STATUS: 0 - Asymptomatic  Vitals:   09/05/18 0816  BP: 130/87  Pulse: 78  Resp: 17  Temp: 97.9 F (36.6 C)  SpO2: 98%    Physical Exam  Constitutional: She is oriented to person, place, and time. She appears well-developed and well-nourished.  HENT:  Head: Normocephalic and atraumatic.  Mouth/Throat: Oropharynx is clear and moist. No oropharyngeal exudate.  Eyes: Pupils are equal, round, and reactive to light. No scleral icterus.  Neck: Neck supple.  Cardiovascular: Normal rate, regular rhythm and normal heart sounds.  Pulmonary/Chest: Effort normal and breath sounds normal. No stridor. No respiratory distress.  Right lumpectomy site healing well  Abdominal: Soft. Bowel sounds are normal.  Musculoskeletal: She exhibits no edema.  Lymphadenopathy:    She has no cervical adenopathy.  Neurological: She is alert and oriented to person, place, and time.  Skin: Skin is warm and dry. Capillary refill takes less than 2 seconds. No rash noted.  Psychiatric: She has a normal  mood and affect.    LABORATORY DATA:  CBC    Component Value Date/Time   WBC 3.7 (L) 09/05/2018 0801   RBC 4.44 09/05/2018 0801   HGB 13.3 09/05/2018 0801   HCT 40.0 09/05/2018 0801   PLT 202 09/05/2018 0801   MCV 90.1 09/05/2018 0801   MCH 30.1 09/05/2018 0801   MCHC 33.4 09/05/2018 0801   RDW 14.9 (H) 09/05/2018 0801   LYMPHSABS 1.2 09/05/2018 0801   MONOABS 0.5 09/05/2018 0801   EOSABS 0.1 09/05/2018 0801   BASOSABS 0.0 09/05/2018 0801    CMP     Component Value Date/Time   NA 142 09/05/2018 0801   K 3.8 09/05/2018 0801   CL 112 (H) 09/05/2018 0801   CO2 24 09/05/2018 0801   GLUCOSE 81 09/05/2018 0801   BUN 16 09/05/2018 0801   CREATININE 0.78 09/05/2018 0801   CALCIUM 8.1 (L) 09/05/2018 0801   PROT 6.3 (L) 09/05/2018 0801   ALBUMIN 3.2 (L) 09/05/2018 0801   AST 16 09/05/2018 0801   ALT 16 09/05/2018 0801   ALKPHOS 91 09/05/2018 0801   BILITOT 0.3 09/05/2018 0801   GFRNONAA >60 09/05/2018 0801   GFRAA >60 09/05/2018 0801            ASSESSMENT and PLAN:   Malignant neoplasm of upper-outer quadrant of right breast in female, estrogen receptor negative ([place]) 08/07/2018:Right lumpectomy: IDC grade 2, 1.5 cm, with intermediate grade DCIS, margins negative, 0/3 lymph nodes negative, triple negative with Ki-67 of 50%, T1 CN 0 stage IB Patient works at [employer] and can work from home if necessary.  Pathology counseling: I discussed the final pathology report of the patient provided  a copy of this report. I discussed the margins as well as lymph node surgeries. We also discussed the final staging along with previously performed ER/PR and HER-2/neu testing.  Treatment plan: 1. Adjuvant chemotherapy with dose dense Adriamycin and Cytoxan x4 followed by Taxol weekly x12 2.  Adjuvant radiation therapy  UPBEAT clinical trial ([id number]): Newly diagnosed stage I to III breast cancer patients receiving either adjuvant or neoadjuvant chemotherapy undergo  cardiac MRI before treatment and at 24 months along with neurocognitive testing, exercise and disability measures at baseline 3, 12 and 24 months.  _________________________________________________________________________________________  Lynelle Smoke is doing well today.  Her CBC is normal and I reviewed it with her in detail.  She understands how to take her anti nausea medications.  She will proceed with her first cycle of Adriamycin/Cytoxan.  I recommended against her taking the Ketoconazole due to the interaction and its potential to increase the Doxorubicin effects on her.  She will contact her PCP for an alternate medication.    We also reviewed her insurance denial of the Udenyca administration after cycle 1 at the infusion center.  I have notified Dr. Montey Hora in pharmacy, and Gaspar Bidding about this issue.  We will work on finding a quick resolution for cycles 2-4.    Matilyn will return in one week for labs and f/u with Dr. [name].     All questions were answered. The patient knows to call the clinic with any problems, questions or concerns. We can certainly see the patient much sooner if necessary.  A total of (20) minutes of face-to-face time was spent with this patient with greater than 50% of that time in counseling and care-coordination.  This note was electronically signed. Scot Dock, NP 09/05/2018

## 2018-09-06 ENCOUNTER — Inpatient Hospital Stay: Payer: BLUE CROSS/BLUE SHIELD

## 2018-09-06 ENCOUNTER — Telehealth: Payer: Self-pay

## 2018-09-06 DIAGNOSIS — Z5111 Encounter for antineoplastic chemotherapy: Secondary | ICD-10-CM | POA: Diagnosis not present

## 2018-09-06 DIAGNOSIS — Z171 Estrogen receptor negative status [ER-]: Secondary | ICD-10-CM | POA: Diagnosis not present

## 2018-09-06 DIAGNOSIS — Z79899 Other long term (current) drug therapy: Secondary | ICD-10-CM | POA: Diagnosis not present

## 2018-09-06 DIAGNOSIS — C50411 Malignant neoplasm of upper-outer quadrant of right female breast: Secondary | ICD-10-CM | POA: Diagnosis not present

## 2018-09-06 DIAGNOSIS — L539 Erythematous condition, unspecified: Secondary | ICD-10-CM | POA: Diagnosis not present

## 2018-09-06 DIAGNOSIS — R21 Rash and other nonspecific skin eruption: Secondary | ICD-10-CM | POA: Diagnosis not present

## 2018-09-06 DIAGNOSIS — Z23 Encounter for immunization: Secondary | ICD-10-CM | POA: Diagnosis not present

## 2018-09-06 MED ORDER — PEGFILGRASTIM-CBQV 6 MG/0.6ML ~~LOC~~ SOSY
PREFILLED_SYRINGE | SUBCUTANEOUS | Status: AC
  Filled 2018-09-06: qty 0.6

## 2018-09-06 MED ORDER — PEGFILGRASTIM-CBQV 6 MG/0.6ML ~~LOC~~ SOSY
6.0000 mg | PREFILLED_SYRINGE | Freq: Once | SUBCUTANEOUS | Status: AC
  Administered 2018-09-06: 6 mg via SUBCUTANEOUS

## 2018-09-06 NOTE — Patient Instructions (Signed)
Pegfilgrastim injection What is this medicine? PEGFILGRASTIM (PEG fil gra stim) is a long-acting granulocyte colony-stimulating factor that stimulates the growth of neutrophils, a type of white blood cell important in the body's fight against infection. It is used to reduce the incidence of fever and infection in patients with certain types of cancer who are receiving chemotherapy that affects the bone marrow, and to increase survival after being exposed to high doses of radiation. This medicine may be used for other purposes; ask your health care provider or pharmacist if you have questions. COMMON BRAND NAME(S): Neulasta What should I tell my health care provider before I take this medicine? They need to know if you have any of these conditions: -kidney disease -latex allergy -ongoing radiation therapy -sickle cell disease -skin reactions to acrylic adhesives (On-Body Injector only) -an unusual or allergic reaction to pegfilgrastim, filgrastim, other medicines, foods, dyes, or preservatives -pregnant or trying to get pregnant -breast-feeding How should I use this medicine? This medicine is for injection under the skin. If you get this medicine at home, you will be taught how to prepare and give the pre-filled syringe or how to use the On-body Injector. Refer to the patient Instructions for Use for detailed instructions. Use exactly as directed. Tell your healthcare provider immediately if you suspect that the On-body Injector may not have performed as intended or if you suspect the use of the On-body Injector resulted in a missed or partial dose. It is important that you put your used needles and syringes in a special sharps container. Do not put them in a trash can. If you do not have a sharps container, call your pharmacist or healthcare provider to get one. Talk to your pediatrician regarding the use of this medicine in children. While this drug may be prescribed for selected conditions,  precautions do apply. Overdosage: If you think you have taken too much of this medicine contact a poison control center or emergency room at once. NOTE: This medicine is only for you. Do not share this medicine with others. What if I miss a dose? It is important not to miss your dose. Call your doctor or health care professional if you miss your dose. If you miss a dose due to an On-body Injector failure or leakage, a new dose should be administered as soon as possible using a single prefilled syringe for manual use. What may interact with this medicine? Interactions have not been studied. Give your health care provider a list of all the medicines, herbs, non-prescription drugs, or dietary supplements you use. Also tell them if you smoke, drink alcohol, or use illegal drugs. Some items may interact with your medicine. This list may not describe all possible interactions. Give your health care provider a list of all the medicines, herbs, non-prescription drugs, or dietary supplements you use. Also tell them if you smoke, drink alcohol, or use illegal drugs. Some items may interact with your medicine. What should I watch for while using this medicine? You may need blood work done while you are taking this medicine. If you are going to need a MRI, CT scan, or other procedure, tell your doctor that you are using this medicine (On-Body Injector only). What side effects may I notice from receiving this medicine? Side effects that you should report to your doctor or health care professional as soon as possible: -allergic reactions like skin rash, itching or hives, swelling of the face, lips, or tongue -dizziness -fever -pain, redness, or irritation at site   where injected -pinpoint red spots on the skin -red or dark-brown urine -shortness of breath or breathing problems -stomach or side pain, or pain at the shoulder -swelling -tiredness -trouble passing urine or change in the amount of urine Side  effects that usually do not require medical attention (report to your doctor or health care professional if they continue or are bothersome): -bone pain -muscle pain This list may not describe all possible side effects. Call your doctor for medical advice about side effects. You may report side effects to FDA at 1-800-FDA-1088. Where should I keep my medicine? Keep out of the reach of children. Store pre-filled syringes in a refrigerator between 2 and 8 degrees C (36 and 46 degrees F). Do not freeze. Keep in carton to protect from light. Throw away this medicine if it is left out of the refrigerator for more than 48 hours. Throw away any unused medicine after the expiration date. NOTE: This sheet is a summary. It may not cover all possible information. If you have questions about this medicine, talk to your doctor, pharmacist, or health care provider.  2018 Elsevier/Gold Standard (2016-11-24 12:58:03)  

## 2018-09-06 NOTE — Telephone Encounter (Signed)
Called to follow up with pt, after first time A/C chemo. LVM with call back number.

## 2018-09-12 ENCOUNTER — Other Ambulatory Visit: Payer: Self-pay

## 2018-09-12 ENCOUNTER — Telehealth: Payer: Self-pay

## 2018-09-12 ENCOUNTER — Inpatient Hospital Stay (HOSPITAL_BASED_OUTPATIENT_CLINIC_OR_DEPARTMENT_OTHER): Payer: BLUE CROSS/BLUE SHIELD | Admitting: Hematology and Oncology

## 2018-09-12 ENCOUNTER — Ambulatory Visit: Payer: BLUE CROSS/BLUE SHIELD | Admitting: Adult Health

## 2018-09-12 ENCOUNTER — Other Ambulatory Visit: Payer: BLUE CROSS/BLUE SHIELD

## 2018-09-12 ENCOUNTER — Inpatient Hospital Stay: Payer: BLUE CROSS/BLUE SHIELD

## 2018-09-12 ENCOUNTER — Telehealth: Payer: Self-pay | Admitting: Hematology and Oncology

## 2018-09-12 ENCOUNTER — Inpatient Hospital Stay: Payer: BLUE CROSS/BLUE SHIELD | Attending: Hematology and Oncology

## 2018-09-12 DIAGNOSIS — Z5111 Encounter for antineoplastic chemotherapy: Secondary | ICD-10-CM | POA: Diagnosis not present

## 2018-09-12 DIAGNOSIS — D709 Neutropenia, unspecified: Secondary | ICD-10-CM

## 2018-09-12 DIAGNOSIS — Z171 Estrogen receptor negative status [ER-]: Secondary | ICD-10-CM

## 2018-09-12 DIAGNOSIS — C50411 Malignant neoplasm of upper-outer quadrant of right female breast: Secondary | ICD-10-CM

## 2018-09-12 DIAGNOSIS — N9489 Other specified conditions associated with female genital organs and menstrual cycle: Secondary | ICD-10-CM | POA: Insufficient documentation

## 2018-09-12 DIAGNOSIS — I10 Essential (primary) hypertension: Secondary | ICD-10-CM | POA: Insufficient documentation

## 2018-09-12 DIAGNOSIS — E785 Hyperlipidemia, unspecified: Secondary | ICD-10-CM | POA: Diagnosis not present

## 2018-09-12 DIAGNOSIS — R51 Headache: Secondary | ICD-10-CM | POA: Insufficient documentation

## 2018-09-12 DIAGNOSIS — Z800 Family history of malignant neoplasm of digestive organs: Secondary | ICD-10-CM | POA: Diagnosis not present

## 2018-09-12 DIAGNOSIS — R5383 Other fatigue: Secondary | ICD-10-CM | POA: Diagnosis not present

## 2018-09-12 DIAGNOSIS — R21 Rash and other nonspecific skin eruption: Secondary | ICD-10-CM | POA: Diagnosis not present

## 2018-09-12 DIAGNOSIS — Z95828 Presence of other vascular implants and grafts: Secondary | ICD-10-CM

## 2018-09-12 LAB — CMP (CANCER CENTER ONLY)
ALBUMIN: 3.5 g/dL (ref 3.5–5.0)
ALK PHOS: 95 U/L (ref 38–126)
ALT: 10 U/L (ref 0–44)
ANION GAP: 8 (ref 5–15)
AST: 15 U/L (ref 15–41)
BUN: 18 mg/dL (ref 6–20)
CO2: 26 mmol/L (ref 22–32)
Calcium: 9.1 mg/dL (ref 8.9–10.3)
Chloride: 107 mmol/L (ref 98–111)
Creatinine: 0.76 mg/dL (ref 0.44–1.00)
GFR, Est AFR Am: >60 mL/min (ref >60)
GFR, Estimated: >60 mL/min (ref >60)
GLUCOSE: 80 mg/dL (ref 70–99)
POTASSIUM: 4.2 mmol/L (ref 3.5–5.1)
SODIUM: 141 mmol/L (ref 135–145)
Total Bilirubin: 0.5 mg/dL (ref 0.3–1.2)
Total Protein: 6.9 g/dL (ref 6.5–8.1)

## 2018-09-12 LAB — CBC WITH DIFFERENTIAL (CANCER CENTER ONLY)
BASOS PCT: 2 %
Basophils Absolute: 0 10*3/uL (ref 0.0–0.1)
EOS PCT: 7 %
Eosinophils Absolute: 0.1 10*3/uL (ref 0.0–0.5)
HEMATOCRIT: 36.6 % (ref 34.8–46.6)
Hemoglobin: 12 g/dL (ref 11.6–15.9)
LYMPHS PCT: 55 %
Lymphs Abs: 0.6 10*3/uL — ABNORMAL LOW (ref 0.9–3.3)
MCH: 30.2 pg (ref 25.1–34.0)
MCHC: 32.8 g/dL (ref 31.5–36.0)
MCV: 92.2 fL (ref 79.5–101.0)
MONO ABS: 0.1 10*3/uL (ref 0.1–0.9)
MONOS PCT: 9 %
NEUTROS ABS: 0.3 10*3/uL — AB (ref 1.5–6.5)
Neutrophils Relative %: 27 %
PLATELETS: 100 10*3/uL — AB (ref 145–400)
RBC: 3.97 MIL/uL (ref 3.70–5.45)
RDW: 14.5 % (ref 11.2–14.5)
WBC Count: 1.1 10*3/uL — ABNORMAL LOW (ref 3.9–10.3)

## 2018-09-12 MED ORDER — HEPARIN SOD (PORK) LOCK FLUSH 100 UNIT/ML IV SOLN
500.0000 [IU] | Freq: Once | INTRAVENOUS | Status: AC | PRN
  Administered 2018-09-12: 500 [IU]
  Filled 2018-09-12: qty 5

## 2018-09-12 MED ORDER — PEGFILGRASTIM-CBQV 6 MG/0.6ML ~~LOC~~ SOSY: 6.0000 mg | PREFILLED_SYRINGE | Freq: Once | SUBCUTANEOUS | 2 refills | Status: DC

## 2018-09-12 MED ORDER — PEGFILGRASTIM-CBQV 6 MG/0.6ML ~~LOC~~ SOSY: 6.0000 mg | PREFILLED_SYRINGE | Freq: Once | SUBCUTANEOUS | 2 refills | Status: AC

## 2018-09-12 MED ORDER — SODIUM CHLORIDE 0.9% FLUSH
10.0000 mL | INTRAVENOUS | Status: DC | PRN
  Administered 2018-09-12: 10 mL
  Filled 2018-09-12: qty 10

## 2018-09-12 NOTE — Telephone Encounter (Signed)
Patient called back to request [facility] be [facility] to [facility] [address].  Medication sent to pharmacy.  No further needs at this time.

## 2018-09-12 NOTE — Assessment & Plan Note (Addendum)
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  Treatment plan: 1.Adjuvant chemotherapy with dose dense Adriamycin and Cytoxan x4 followed by Taxol weekly x12 2.Adjuvant radiation therapy  UPBEAT clinical trial ([id number]): No adverse effects being on the clinical trial _________________________________________________________________________________________ Current treatment: Cycle 1 day 8 dose dense Adriamycin and Cytoxan Chemo toxicities: 1.  Severe neutropenia ANC 300: Provided her neutropenic precautions.  We will decrease the dosage of chemotherapy with cycle 2 2. intermittent headache Denies any nausea or vomiting.  Udenyca: We will send a prescription to [facility].  From next cycle onwards patient will self inject it.  Return to clinic in 1 week for cycle 2.

## 2018-09-12 NOTE — Telephone Encounter (Signed)
Per 10/2 los, no orders.

## 2018-09-13 ENCOUNTER — Telehealth: Payer: Self-pay

## 2018-09-13 ENCOUNTER — Other Ambulatory Visit: Payer: Self-pay | Admitting: Hematology and Oncology

## 2018-09-13 ENCOUNTER — Telehealth: Payer: Self-pay | Admitting: Pharmacist

## 2018-09-13 MED ORDER — PEGFILGRASTIM-CBQV 6 MG/0.6ML ~~LOC~~ SOSY: 6.0000 mg | PREFILLED_SYRINGE | Freq: Once | SUBCUTANEOUS | 2 refills | Status: AC

## 2018-09-13 NOTE — Telephone Encounter (Signed)
Oral Oncology Patient Advocate Encounter  Received notification from [place] that prior authorization for Jennifer Carroll is required.  PA submitted on CoverMyMeds Key [id number] Status is pending  Oral Oncology Clinic will continue to follow.  [place] Patient [place] Phone [phone] Fax [phone]

## 2018-09-13 NOTE — Telephone Encounter (Signed)
Oncology Pharmacist Encounter  Received new prescription for Udenyca as patient insurance will not cover medication to be given in the infusion center.   Prescription has been e-scribed to the [facility] for benefits analysis and approval. The prior authorization was received this morning and patient must use [facility] to acquire this medication. I attempted to call patient to make her aware that [facility] will be calling her to set up initial patient enrollment. I left message for patient to call my office.   Per [facility] their process can take up to two days for initial processing of prescription and no information can be ontained about the patient's co-payment until the prescription has processed. Continue to follow.   Pharmacy will continue to follow for more information regarding shipping, need for co-payment assistance, initial counseling for injection technique appointment.    Jalene Mullet, PharmD PGY2 Hematology/ Oncology Pharmacy Resident [phone] 09/13/2018 12:26 PM

## 2018-09-13 NOTE — Telephone Encounter (Signed)
Oral Oncology Patient Advocate Encounter  Prior Authorization for Ellen Henri has been approved.    PA# [id number] Effective dates: 09/12/18 through 03/14/19  Oral Oncology Clinic will continue to follow.   [place] Patient [place] Phone [phone] Fax [phone]

## 2018-09-14 ENCOUNTER — Telehealth: Payer: Self-pay | Admitting: Pharmacist

## 2018-09-14 NOTE — Telephone Encounter (Signed)
Oral Oncology Patient Advocate Encounter  [facility] called [facility] to follow up on Udenyca prescription. There is a $0 copay and the pharmacy will attempt to contact the patient today to schedule shipment.  [place] Patient [place] Phone [phone] Fax [phone]

## 2018-09-17 MED FILL — LOSARTAN POTASSIUM 50 MG TA: 50 | 30 days supply | Qty: 30 | Fill #1

## 2018-09-18 ENCOUNTER — Encounter: Payer: Self-pay | Admitting: Physical Therapy

## 2018-09-19 ENCOUNTER — Telehealth: Payer: Self-pay | Admitting: Adult Health

## 2018-09-19 ENCOUNTER — Inpatient Hospital Stay (HOSPITAL_BASED_OUTPATIENT_CLINIC_OR_DEPARTMENT_OTHER): Payer: BLUE CROSS/BLUE SHIELD | Admitting: Adult Health

## 2018-09-19 ENCOUNTER — Inpatient Hospital Stay: Payer: BLUE CROSS/BLUE SHIELD

## 2018-09-19 ENCOUNTER — Encounter: Payer: Self-pay | Admitting: Adult Health

## 2018-09-19 VITALS — BP 137/89 | HR 78 | Temp 98.4°F | Resp 17 | Ht 66.5 in | Wt 235.2 lb

## 2018-09-19 DIAGNOSIS — R21 Rash and other nonspecific skin eruption: Secondary | ICD-10-CM

## 2018-09-19 DIAGNOSIS — N9489 Other specified conditions associated with female genital organs and menstrual cycle: Secondary | ICD-10-CM

## 2018-09-19 DIAGNOSIS — Z5111 Encounter for antineoplastic chemotherapy: Secondary | ICD-10-CM | POA: Diagnosis not present

## 2018-09-19 DIAGNOSIS — I10 Essential (primary) hypertension: Secondary | ICD-10-CM | POA: Diagnosis not present

## 2018-09-19 DIAGNOSIS — Z800 Family history of malignant neoplasm of digestive organs: Secondary | ICD-10-CM | POA: Diagnosis not present

## 2018-09-19 DIAGNOSIS — Z171 Estrogen receptor negative status [ER-]: Secondary | ICD-10-CM

## 2018-09-19 DIAGNOSIS — E785 Hyperlipidemia, unspecified: Secondary | ICD-10-CM

## 2018-09-19 DIAGNOSIS — R5383 Other fatigue: Secondary | ICD-10-CM | POA: Diagnosis not present

## 2018-09-19 DIAGNOSIS — D709 Neutropenia, unspecified: Secondary | ICD-10-CM

## 2018-09-19 DIAGNOSIS — C50411 Malignant neoplasm of upper-outer quadrant of right female breast: Secondary | ICD-10-CM | POA: Diagnosis not present

## 2018-09-19 DIAGNOSIS — R51 Headache: Secondary | ICD-10-CM

## 2018-09-19 DIAGNOSIS — Z95828 Presence of other vascular implants and grafts: Secondary | ICD-10-CM

## 2018-09-19 LAB — CMP (CANCER CENTER ONLY)
ALBUMIN: 3.9 g/dL (ref 3.5–5.0)
ALT: 19 U/L (ref 0–44)
ANION GAP: 9 (ref 5–15)
AST: 17 U/L (ref 15–41)
Alkaline Phosphatase: 113 U/L (ref 38–126)
BILIRUBIN TOTAL: 0.2 mg/dL — AB (ref 0.3–1.2)
BUN: 11 mg/dL (ref 6–20)
CO2: 24 mmol/L (ref 22–32)
Calcium: 9.6 mg/dL (ref 8.9–10.3)
Chloride: 109 mmol/L (ref 98–111)
Creatinine: 0.77 mg/dL (ref 0.44–1.00)
GFR, Est AFR Am: >60 mL/min (ref >60)
GFR, Estimated: >60 mL/min (ref >60)
Glucose, Bld: 88 mg/dL (ref 70–99)
POTASSIUM: 4.1 mmol/L (ref 3.5–5.1)
SODIUM: 142 mmol/L (ref 135–145)
Total Protein: 7.3 g/dL (ref 6.5–8.1)

## 2018-09-19 LAB — CBC WITH DIFFERENTIAL (CANCER CENTER ONLY)
ABS IMMATURE GRANULOCYTES: 0.4 10*3/uL — AB (ref 0.00–0.07)
BASOS PCT: 1 %
Basophils Absolute: 0.1 10*3/uL (ref 0.0–0.1)
Eosinophils Absolute: 0 10*3/uL (ref 0.0–0.5)
Eosinophils Relative: 1 %
HEMATOCRIT: 38.8 % (ref 36.0–46.0)
HEMOGLOBIN: 13.1 g/dL (ref 12.0–15.0)
IMMATURE GRANULOCYTES: 7 %
LYMPHS ABS: 1.2 10*3/uL (ref 0.7–4.0)
LYMPHS PCT: 19 %
MCH: 30.8 pg (ref 26.0–34.0)
MCHC: 33.8 g/dL (ref 30.0–36.0)
MCV: 91.1 fL (ref 80.0–100.0)
Monocytes Absolute: 0.5 10*3/uL (ref 0.1–1.0)
Monocytes Relative: 8 %
NEUTROS ABS: 3.8 10*3/uL (ref 1.7–7.7)
Neutrophils Relative %: 64 %
PLATELETS: 141 10*3/uL — AB (ref 150–400)
RBC: 4.26 MIL/uL (ref 3.87–5.11)
RDW: 14.9 % (ref 11.5–15.5)
WBC Count: 5.9 10*3/uL (ref 4.0–10.5)
nRBC: 0.3 % — ABNORMAL HIGH (ref 0.0–0.2)

## 2018-09-19 MED ORDER — SODIUM CHLORIDE 0.9% FLUSH
10.0000 mL | INTRAVENOUS | Status: DC | PRN
  Administered 2018-09-19: 10 mL
  Filled 2018-09-19: qty 10

## 2018-09-19 MED ORDER — PALONOSETRON HCL INJECTION 0.25 MG/5ML
INTRAVENOUS | Status: AC
  Filled 2018-09-19: qty 5

## 2018-09-19 MED ORDER — SODIUM CHLORIDE 0.9 % IV SOLN
Freq: Once | INTRAVENOUS | Status: AC
  Administered 2018-09-19: 10:00:00 via INTRAVENOUS
  Filled 2018-09-19: qty 250

## 2018-09-19 MED ORDER — DOXORUBICIN HCL CHEMO IV INJECTION 2 MG/ML
50.0000 mg/m2 | Freq: Once | INTRAVENOUS | Status: AC
  Administered 2018-09-19: 112 mg via INTRAVENOUS
  Filled 2018-09-19: qty 56

## 2018-09-19 MED ORDER — SODIUM CHLORIDE 0.9 % IV SOLN
500.0000 mg/m2 | Freq: Once | INTRAVENOUS | Status: AC
  Administered 2018-09-19: 1120 mg via INTRAVENOUS
  Filled 2018-09-19: qty 56

## 2018-09-19 MED ORDER — HEPARIN SOD (PORK) LOCK FLUSH 100 UNIT/ML IV SOLN
500.0000 [IU] | Freq: Once | INTRAVENOUS | Status: AC | PRN
  Administered 2018-09-19: 500 [IU]
  Filled 2018-09-19: qty 5

## 2018-09-19 MED ORDER — PALONOSETRON HCL INJECTION 0.25 MG/5ML
0.2500 mg | Freq: Once | INTRAVENOUS | Status: AC
  Administered 2018-09-19: 0.25 mg via INTRAVENOUS

## 2018-09-19 MED ORDER — METRONIDAZOLE 1 % EX GEL: Freq: Every day | CUTANEOUS | 0 refills | Status: DC

## 2018-09-19 MED ORDER — SODIUM CHLORIDE 0.9 % IV SOLN
Freq: Once | INTRAVENOUS | Status: AC
  Administered 2018-09-19: 11:00:00 via INTRAVENOUS
  Filled 2018-09-19: qty 5

## 2018-09-19 NOTE — Progress Notes (Signed)
PEr Dr [name] okay to treat today with adriamycin and Cytoxan and cmet from 7 days ago. CMET collected today .

## 2018-09-19 NOTE — Progress Notes (Signed)
Jennifer Carroll:    Jennifer Neer, MD [address] [place] Jennifer Carroll [id number]   DIAGNOSIS: Cancer Staging Malignant neoplasm of upper-outer quadrant of right breast in female, estrogen receptor negative ([place]) Staging form: Breast, AJCC 8th Edition - Clinical: Stage IB (cT1c, cN0, cM0, G2, ER-, PR-, HER2-) - Unsigned - Pathologic stage from 08/07/2018: Stage IB (pT1c, pN0, cM0, G2, ER-, PR-, HER2-) - Signed by Jennifer Phlegm, NP on 08/22/2018   SUMMARY OF ONCOLOGIC HISTORY:   Malignant neoplasm of upper-outer quadrant of right breast in female, estrogen receptor negative ([place])   07/13/2018 Initial Diagnosis    Screening detected right breast asymmetry UOQ 10 o'clock position 1.4 cm, axilla negative, biopsy revealed grade 2 IDC triple negative with a Ki-67 of 50% T1c N0 stage Ib AJCC 8    08/06/2018 Genetic Testing    NBN c.2117C>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  The Hereditary Gene Panel offered by [facility] includes sequencing and/or deletion duplication testing of the following 47 genes: APC, ATM, AXIN2, BARD1, BMPR1A, BRCA1, BRCA2, BRIP1, CDH1, CDK4, CDKN2A (p14ARF), CDKN2A (p16INK4a), CHEK2, CTNNA1, DICER1, EPCAM (Deletion/duplication testing only), GREM1 (promoter region deletion/duplication testing only), KIT, MEN1, MLH1, MSH2, MSH3, MSH6, MUTYH, NBN, NF1, NHTL1, PALB2, PDGFRA, PMS2, POLD1, POLE, PTEN, RAD50, RAD51C, RAD51D, SDHB, SDHC, SDHD, SMAD4, SMARCA4. STK11, TP53, TSC1, TSC2, and VHL.  The following genes were evaluated for sequence changes only: SDHA and HOXB13 c.251G>A variant only. The report date is August 06, 2018.    08/07/2018 Surgery    Right lumpectomy: IDC grade 2, 1.5 cm, with intermediate grade DCIS, margins negative, 0/3 lymph nodes negative, triple negative with Ki-67 of 50%, T1 CN 0 stage IB    08/07/2018 Cancer Staging    Staging form:  Breast, AJCC 8th Edition - Pathologic stage from 08/07/2018: Stage IB (pT1c, pN0, cM0, G2, ER-, PR-, HER2-) - Signed by Jennifer Phlegm, NP on 08/22/2018     CURRENT THERAPY: Adriamycin/Cytoxan cycle 2 day 1  INTERVAL HISTORY: Jennifer Carroll 52 y.o. female returns for evaluation prior to receiving her second cycle of adjuvant chemotherapy with Adriamycin and cytoxan.  She is doing well today.  She has a rash on her face with acne lesions that has been present for one week.  She is otherwise doing well.  Jennifer Carroll has not received the Udenyca yet.  My nurse Jennifer Carroll, called [facility] and we were told to call the specialty pharmacy because they did not receive the details of the Udenyca.  Their phone number is [phone].   Patient Active Problem List   Diagnosis Date Noted  . Port-A-Cath in place 09/12/2018  . 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 Genetic testing 08/06/2018  . Family history of pancreatic cancer   . Malignant neoplasm of upper-outer quadrant of right breast in female, estrogen receptor negative ([place]) 07/18/2018  . Irregular menses 05/08/2012    has No Known Allergies.  MEDICAL HISTORY: Past Medical History:  Diagnosis Date  . AMA (advanced maternal age) multigravida 35+ 03/28/05   with no amniocentesis  . 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 Cancer [facility])    Right breast   . Family history of pancreatic cancer   . H/O infertility   . H/O menorrhagia   . History of chicken pox   . History of measles, mumps, or rubella   . Hyperlipidemia   . Hypertension   .  Low iron   . Menses, irregular   . 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   Jennifer Carroll     SURGICAL HISTORY: Past Surgical History:  Procedure Laterality Date  . BREAST LUMPECTOMY WITH RADIOACTIVE SEED AND SENTINEL LYMPH NODE BIOPSY Right  08/07/2018   Procedure: INJECT BLUE DYE RIGHT BREAST,RIGHT BREAST LUMPECTOMY WITH RADIOACTIVE SEED AND RIGHT AXILLARY DEEP SENTINEL LYMPH NODE BIOPSY;  Surgeon: Fanny Skates, MD;  Location: [place];  Service: General;  Laterality: Right;  . EYE SURGERY     left eye at age 44  . head surgery     to cover hole in head at age of  72 yrs young  . PORTACATH PLACEMENT Left 08/07/2018   Procedure: INSERTION PORT-A-CATH;  Surgeon: Fanny Skates, MD;  Location: [place];  Service: General;  Laterality: Left;  . WISDOM TOOTH EXTRACTION  2001    SOCIAL HISTORY: Social History   Socioeconomic History  . Marital status: Married    Spouse name: Not on file  . Number of children: Not on file  . Years of education: Not on file  . Highest education level: Not on file  Occupational History  . Not on file  Social Needs  . Financial resource strain: Not on file  . Food insecurity:    Worry: Not on file    Inability: Not on file  . Transportation needs:    Medical: Not on file    Non-medical: Not on file  Tobacco Use  . Smoking status: Never Smoker  . Smokeless tobacco: Never Used  Substance and Sexual Activity  . Alcohol use: Not Currently  . Drug use: Never  . Sexual activity: Yes  Lifestyle  . Physical activity:    Days per week: Not on file    Minutes per session: Not on file  . Stress: Not on file  Relationships  . Social connections:    Talks on phone: Not on file    Gets together: Not on file    Attends religious service: Not on file    Active member of club or organization: Not on file    Attends meetings of clubs or organizations: Not on file    Relationship status: Not on file  . Intimate partner violence:    Fear of current or ex partner: Not on file    Emotionally abused: Not on file    Physically abused: Not on file    Forced sexual activity: Not on file  Other Topics Concern  . Not on file  Social History Narrative  . Not on file     FAMILY HISTORY: Family History  Problem Relation Age of Onset  . Hypertension Mother   . Kidney disease Mother   . Heart disease Father   . Hypertension Brother   . Kidney failure Maternal Grandfather   . Pancreatic cancer Cousin        dx >50    Review of Systems  Constitutional: Positive for fatigue. Negative for appetite change, chills, fever and unexpected weight change.  HENT:   Negative for hearing loss and lump/mass.   Eyes: Negative for eye problems and icterus.  Respiratory: Negative for chest tightness, cough and shortness of breath.   Cardiovascular: Negative for chest pain, leg swelling and palpitations.  Gastrointestinal: Negative for abdominal distention, abdominal pain, constipation, diarrhea, nausea and  vomiting.  Endocrine: Negative for hot flashes.  Skin: Negative for itching and rash.  Neurological: Negative for dizziness, extremity weakness, headaches and numbness.  Hematological: Negative for adenopathy. Does not bruise/bleed easily.  Psychiatric/Behavioral: Negative for depression. The patient is not nervous/anxious.       PHYSICAL EXAMINATION  ECOG PERFORMANCE STATUS: 1 - Symptomatic but completely ambulatory  Vitals:   09/19/18 0838  BP: 137/89  Pulse: 78  Resp: 17  Temp: 98.4 F (36.9 C)  SpO2: 98%    Physical Exam  Constitutional: She is oriented to person, place, and time. She appears well-developed and well-nourished.  HENT:  Head: Normocephalic and atraumatic.  Mouth/Throat: Oropharynx is clear and moist.  Eyes: Pupils are equal, round, and reactive to light. No scleral icterus.  Neck: Neck supple.  Cardiovascular: Normal rate, regular rhythm, normal heart sounds and intact distal pulses.  Pulmonary/Chest: Effort normal and breath sounds normal.  Breasts: right breast s/p lumpectomy, no sign of recurrence, fungal rash noted underneath breast, left breast benign, fungal rash noted underneath breast  Abdominal: Soft. Bowel sounds are  normal. She exhibits no distension and no mass. There is no tenderness. There is no rebound and no guarding.  Musculoskeletal: She exhibits no edema.  Lymphadenopathy:    She has no cervical adenopathy.  Neurological: She is alert and oriented to person, place, and time.  Skin: Skin is warm and dry. Capillary refill takes less than 2 seconds. No rash noted.  Psychiatric: She has a normal mood and affect.    LABORATORY DATA:  CBC    Component Value Date/Time   WBC 5.9 09/19/2018 0832   RBC 4.26 09/19/2018 0832   HGB 13.1 09/19/2018 0832   HCT 38.8 09/19/2018 0832   PLT 141 (L) 09/19/2018 0832   MCV 91.1 09/19/2018 0832   MCH 30.8 09/19/2018 0832   MCHC 33.8 09/19/2018 0832   RDW 14.9 09/19/2018 0832   LYMPHSABS 1.2 09/19/2018 0832   MONOABS 0.5 09/19/2018 0832   EOSABS 0.0 09/19/2018 0832   BASOSABS 0.1 09/19/2018 0832    CMP     Component Value Date/Time   NA 142 09/19/2018 0832   K 4.1 09/19/2018 0832   CL 109 09/19/2018 0832   CO2 24 09/19/2018 0832   GLUCOSE 88 09/19/2018 0832   BUN 11 09/19/2018 0832   CREATININE 0.77 09/19/2018 0832   CALCIUM 9.6 09/19/2018 0832   PROT 7.3 09/19/2018 0832   ALBUMIN 3.9 09/19/2018 0832   AST 17 09/19/2018 0832   ALT 19 09/19/2018 0832   ALKPHOS 113 09/19/2018 0832   BILITOT 0.2 (L) 09/19/2018 0832   GFRNONAA >60 09/19/2018 0832   GFRAA >60 09/19/2018 0832      ASSESSMENT and PLAN:   Malignant neoplasm of upper-outer quadrant of right breast in female, estrogen receptor negative ([place]) 08/07/2018:Right lumpectomy: IDC grade 2, 1.5 cm, with intermediate grade DCIS, margins negative, 0/3 lymph nodes negative, triple negative with Ki-67 of 50%, T1 CN 0 stage IB Patient works at [employer] and can work from home if necessary.  Treatment plan: 1.Adjuvant chemotherapy with dose dense Adriamycin and Cytoxan x4 followed by Taxol weekly x12 2.Adjuvant radiation therapy  UPBEAT clinical trial ([id number]): No adverse  effects being on the clinical trial _________________________________________________________________________________________ Current treatment: Cycle 2 day 1 dose dense Adriamycin and Cytoxan Chemo toxicities: Facial rash: sent in metrogel to apply daily Udenyca: [facility].  Spoke with Lauralyn Primes, followed by Larena Glassman, followed by Josph Macho, and was connected to  [place] in pharmacy.  Spent about 30 minutes on the phone and verified that her Ellen Henri was processed and she will receive it no later than Friday.  Patient also worked with pharmacy to get the injection delivered to her.  She and Dr. [name] have reviewed her comfort level with giving herself the [facility].   Jametta will receive further education in the infusion area detailing injecting her own Udenyca and the timing of doing so.  Return to clinic in 2 weeks for labs, f/u, and cycle 3 of chemotherapy.   All questions were answered. The patient knows to call the clinic with any problems, questions or concerns. We can certainly see the patient much sooner if necessary.  A total of (30) minutes of face-to-face time was spent with this patient with greater than 50% of that time in counseling and care-coordination.  This note was electronically signed. Scot Dock, NP 09/19/2018

## 2018-09-19 NOTE — Assessment & Plan Note (Addendum)
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  Treatment plan: 1.Adjuvant chemotherapy with dose dense Adriamycin and Cytoxan x4 followed by Taxol weekly x12 2.Adjuvant radiation therapy  UPBEAT clinical trial ([id number]): No adverse effects being on the clinical trial _________________________________________________________________________________________ Current treatment: Cycle 2 day 1 dose dense Adriamycin and Cytoxan Chemo toxicities: Facial rash: sent in metrogel to apply daily Udenyca: [facility].  Spoke with Lauralyn Primes, followed by Larena Glassman, followed by Josph Macho, and was connected to [place] in pharmacy.  Spent about 30 minutes on the phone and verified that her Ellen Henri was processed and she will receive it no later than Friday.  Patient also worked with pharmacy to get the injection delivered to her.  She and Dr. [name] have reviewed her comfort level with giving herself the [facility].   Audreana will receive further education in the infusion area detailing injecting her own Udenyca and the timing of doing so.  Return to clinic in 2 weeks for labs, f/u, and cycle 3 of chemotherapy.

## 2018-09-19 NOTE — Patient Instructions (Signed)
[facility] Discharge Instructions for Patients Receiving Chemotherapy  Today you received the following chemotherapy agents Adriamycin and Cytoxan  To help prevent nausea and vomiting after your treatment, we encourage you to take your nausea medication as prescribed.   If you develop nausea and vomiting that is not controlled by your nausea medication, call the clinic.   BELOW ARE SYMPTOMS THAT SHOULD BE REPORTED IMMEDIATELY:  *FEVER GREATER THAN 100.5 F  *CHILLS WITH OR WITHOUT FEVER  NAUSEA AND VOMITING THAT IS NOT CONTROLLED WITH YOUR NAUSEA MEDICATION  *UNUSUAL SHORTNESS OF BREATH  *UNUSUAL BRUISING OR BLEEDING  TENDERNESS IN MOUTH AND THROAT WITH OR WITHOUT PRESENCE OF ULCERS  *URINARY PROBLEMS  *BOWEL PROBLEMS  UNUSUAL RASH Items with * indicate a potential emergency and should be followed up as soon as possible.  Feel free to call the clinic should you have any questions or concerns. The clinic phone number is [phone].  Please show the [place] at check-in to the Emergency Department and triage nurse.  Doxorubicin injection (Adriamycin) What is this medicine? DOXORUBICIN (dox oh ROO bi sin) is a chemotherapy drug. It is used to treat many kinds of cancer like leukemia, lymphoma, neuroblastoma, sarcoma, and Wilms' tumor. It is also used to treat bladder cancer, breast cancer, lung cancer, ovarian cancer, stomach cancer, and thyroid cancer. This medicine may be used for other purposes; ask your health care provider or pharmacist if you have questions. COMMON BRAND NAME(S): Adriamycin, Adriamycin PFS, Adriamycin RDF, Rubex What should I tell my health care provider before I take this medicine? They need to know if you have any of these conditions: -heart disease -history of low blood counts caused by a medicine -liver disease -recent or ongoing radiation therapy -an unusual or allergic reaction to doxorubicin, other chemotherapy agents,  other medicines, foods, dyes, or preservatives -pregnant or trying to get pregnant -breast-feeding How should I use this medicine? This drug is given as an infusion into a vein. It is administered in a hospital or clinic by a specially trained health care professional. If you have pain, swelling, burning or any unusual feeling around the site of your injection, tell your health care professional right away. Talk to your pediatrician regarding the use of this medicine in children. Special care may be needed. Overdosage: If you think you have taken too much of this medicine contact a poison control center or emergency room at once. NOTE: This medicine is only for you. Do not share this medicine with others. What if I miss a dose? It is important not to miss your dose. Call your doctor or health care professional if you are unable to keep an appointment. What may interact with this medicine? This medicine may interact with the following medications: -6-mercaptopurine -paclitaxel -phenytoin -St. John's Wort -trastuzumab -verapamil This list may not describe all possible interactions. Give your health care provider a list of all the medicines, herbs, non-prescription drugs, or dietary supplements you use. Also tell them if you smoke, drink alcohol, or use illegal drugs. Some items may interact with your medicine. What should I watch for while using this medicine? This drug may make you feel generally unwell. This is not uncommon, as chemotherapy can affect healthy cells as well as cancer cells. Report any side effects. Continue your course of treatment even though you feel ill unless your doctor tells you to stop. There is a maximum amount of this medicine you should receive throughout your life. The amount depends  on the medical condition being treated and your overall health. Your doctor will watch how much of this medicine you receive in your lifetime. Tell your doctor if you have taken this  medicine before. You may need blood work done while you are taking this medicine. Your urine may turn red for a few days after your dose. This is not blood. If your urine is dark or brown, call your doctor. In some cases, you may be given additional medicines to help with side effects. Follow all directions for their use. Call your doctor or health care professional for advice if you get a fever, chills or sore throat, or other symptoms of a cold or flu. Do not treat yourself. This drug decreases your body's ability to fight infections. Try to avoid being around people who are sick. This medicine may increase your risk to bruise or bleed. Call your doctor or health care professional if you notice any unusual bleeding. Talk to your doctor about your risk of cancer. You may be more at risk for certain types of cancers if you take this medicine. Do not become pregnant while taking this medicine or for 6 months after stopping it. Women should inform their doctor if they wish to become pregnant or think they might be pregnant. Men should not father a child while taking this medicine and for 6 months after stopping it. There is a potential for serious side effects to an unborn child. Talk to your health care professional or pharmacist for more information. Do not breast-feed an infant while taking this medicine. This medicine has caused ovarian failure in some women and reduced sperm counts in some men This medicine may interfere with the ability to have a child. Talk with your doctor or health care professional if you are concerned about your fertility. What side effects may I notice from receiving this medicine? Side effects that you should report to your doctor or health care professional as soon as possible: -allergic reactions like skin rash, itching or hives, swelling of the face, lips, or tongue -breathing problems -chest pain -fast or irregular heartbeat -low blood counts - this medicine may  decrease the number of white blood cells, red blood cells and platelets. You may be at increased risk for infections and bleeding. -pain, redness, or irritation at site where injected -signs of infection - fever or chills, cough, sore throat, pain or difficulty passing urine -signs of decreased platelets or bleeding - bruising, pinpoint red spots on the skin, black, tarry stools, blood in the urine -swelling of the ankles, feet, hands -tiredness -weakness Side effects that usually do not require medical attention (report to your doctor or health care professional if they continue or are bothersome): -diarrhea -hair loss -mouth sores -nail discoloration or damage -nausea -red colored urine -vomiting This list may not describe all possible side effects. Call your doctor for medical advice about side effects. You may report side effects to FDA at 1-800-FDA-1088. Where should I keep my medicine? This drug is given in a hospital or clinic and will not be stored at home. NOTE: This sheet is a summary. It may not cover all possible information. If you have questions about this medicine, talk to your doctor, pharmacist, or health care provider.  2018 Elsevier/Gold Standard (2016-01-25 11:28:51)   Cyclophosphamide injection (Cytoxan) What is this medicine? CYCLOPHOSPHAMIDE (sye kloe FOSS fa mide) is a chemotherapy drug. It slows the growth of cancer cells. This medicine is used to treat many types of  cancer like lymphoma, myeloma, leukemia, breast cancer, and ovarian cancer, to name a few. This medicine may be used for other purposes; ask your health care provider or pharmacist if you have questions. COMMON BRAND NAME(S): Cytoxan, Neosar What should I tell my health care provider before I take this medicine? They need to know if you have any of these conditions: -blood disorders -history of other chemotherapy -infection -kidney disease -liver disease -recent or ongoing radiation  therapy -tumors in the bone marrow -an unusual or allergic reaction to cyclophosphamide, other chemotherapy, other medicines, foods, dyes, or preservatives -pregnant or trying to get pregnant -breast-feeding How should I use this medicine? This drug is usually given as an injection into a vein or muscle or by infusion into a vein. It is administered in a hospital or clinic by a specially trained health care professional. Talk to your pediatrician regarding the use of this medicine in children. Special care may be needed. Overdosage: If you think you have taken too much of this medicine contact a poison control center or emergency room at once. NOTE: This medicine is only for you. Do not share this medicine with others. What if I miss a dose? It is important not to miss your dose. Call your doctor or health care professional if you are unable to keep an appointment. What may interact with this medicine? This medicine may interact with the following medications: -amiodarone -amphotericin B -azathioprine -certain antiviral medicines for HIV or AIDS such as protease inhibitors (e.g., indinavir, ritonavir) and zidovudine -certain blood pressure medications such as benazepril, captopril, enalapril, fosinopril, lisinopril, moexipril, monopril, perindopril, quinapril, ramipril, trandolapril -certain cancer medications such as anthracyclines (e.g., daunorubicin, doxorubicin), busulfan, cytarabine, paclitaxel, pentostatin, tamoxifen, trastuzumab -certain diuretics such as chlorothiazide, chlorthalidone, hydrochlorothiazide, indapamide, metolazone -certain medicines that treat or prevent blood clots like warfarin -certain muscle relaxants such as succinylcholine -cyclosporine -etanercept -indomethacin -medicines to increase blood counts like filgrastim, pegfilgrastim, sargramostim -medicines used as general anesthesia -metronidazole -natalizumab This list may not describe all possible  interactions. Give your health care provider a list of all the medicines, herbs, non-prescription drugs, or dietary supplements you use. Also tell them if you smoke, drink alcohol, or use illegal drugs. Some items may interact with your medicine. What should I watch for while using this medicine? Visit your doctor for checks on your progress. This drug may make you feel generally unwell. This is not uncommon, as chemotherapy can affect healthy cells as well as cancer cells. Report any side effects. Continue your course of treatment even though you feel ill unless your doctor tells you to stop. Drink water or other fluids as directed. Urinate often, even at night. In some cases, you may be given additional medicines to help with side effects. Follow all directions for their use. Call your doctor or health care professional for advice if you get a fever, chills or sore throat, or other symptoms of a cold or flu. Do not treat yourself. This drug decreases your body's ability to fight infections. Try to avoid being around people who are sick. This medicine may increase your risk to bruise or bleed. Call your doctor or health care professional if you notice any unusual bleeding. Be careful brushing and flossing your teeth or using a toothpick because you may get an infection or bleed more easily. If you have any dental work done, tell your dentist you are receiving this medicine. You may get drowsy or dizzy. Do not drive, use machinery, or do anything that  needs mental alertness until you know how this medicine affects you. Do not become pregnant while taking this medicine or for 1 year after stopping it. Women should inform their doctor if they wish to become pregnant or think they might be pregnant. Men should not father a child while taking this medicine and for 4 months after stopping it. There is a potential for serious side effects to an unborn child. Talk to your health care professional or pharmacist for  more information. Do not breast-feed an infant while taking this medicine. This medicine may interfere with the ability to have a child. This medicine has caused ovarian failure in some women. This medicine has caused reduced sperm counts in some men. You should talk with your doctor or health care professional if you are concerned about your fertility. If you are going to have surgery, tell your doctor or health care professional that you have taken this medicine. What side effects may I notice from receiving this medicine? Side effects that you should report to your doctor or health care professional as soon as possible: -allergic reactions like skin rash, itching or hives, swelling of the face, lips, or tongue -low blood counts - this medicine may decrease the number of white blood cells, red blood cells and platelets. You may be at increased risk for infections and bleeding. -signs of infection - fever or chills, cough, sore throat, pain or difficulty passing urine -signs of decreased platelets or bleeding - bruising, pinpoint red spots on the skin, black, tarry stools, blood in the urine -signs of decreased red blood cells - unusually weak or tired, fainting spells, lightheadedness -breathing problems -dark urine -dizziness -palpitations -swelling of the ankles, feet, hands -trouble passing urine or change in the amount of urine -weight gain -yellowing of the eyes or skin Side effects that usually do not require medical attention (report to your doctor or health care professional if they continue or are bothersome): -changes in nail or skin color -hair loss -missed menstrual periods -mouth sores -nausea, vomiting This list may not describe all possible side effects. Call your doctor for medical advice about side effects. You may report side effects to FDA at 1-800-FDA-1088. Where should I keep my medicine? This drug is given in a hospital or clinic and will not be stored at  home. NOTE: This sheet is a summary. It may not cover all possible information. If you have questions about this medicine, talk to your doctor, pharmacist, or health care provider.  2018 Elsevier/Gold Standard (2012-10-12 16:22:58)

## 2018-09-19 NOTE — Progress Notes (Signed)
Okay to proceed with treatment today with CMP results pending per Dr. [name].

## 2018-09-19 NOTE — Telephone Encounter (Signed)
No orders per 10/8 los

## 2018-09-20 ENCOUNTER — Encounter: Payer: Self-pay | Admitting: Adult Health

## 2018-09-24 ENCOUNTER — Telehealth: Payer: Self-pay | Admitting: Adult Health

## 2018-09-24 NOTE — Telephone Encounter (Signed)
Per 10/9 no los 

## 2018-09-26 NOTE — Telephone Encounter (Signed)
Discussed $0 copay for udenyca per [facility] and also made patient aware that [facility] would contact her to create profile and set-up delivery of medication. Injection technique will be shown to patient in office appointment next week.

## 2018-10-03 ENCOUNTER — Telehealth: Payer: Self-pay | Admitting: Adult Health

## 2018-10-03 ENCOUNTER — Inpatient Hospital Stay: Payer: BLUE CROSS/BLUE SHIELD

## 2018-10-03 ENCOUNTER — Inpatient Hospital Stay (HOSPITAL_BASED_OUTPATIENT_CLINIC_OR_DEPARTMENT_OTHER): Payer: BLUE CROSS/BLUE SHIELD | Admitting: Adult Health

## 2018-10-03 ENCOUNTER — Encounter: Payer: Self-pay | Admitting: Adult Health

## 2018-10-03 VITALS — BP 151/98 | HR 79 | Temp 98.6°F | Resp 18 | Ht 66.5 in | Wt 234.4 lb

## 2018-10-03 DIAGNOSIS — E785 Hyperlipidemia, unspecified: Secondary | ICD-10-CM

## 2018-10-03 DIAGNOSIS — R21 Rash and other nonspecific skin eruption: Secondary | ICD-10-CM | POA: Diagnosis not present

## 2018-10-03 DIAGNOSIS — Z800 Family history of malignant neoplasm of digestive organs: Secondary | ICD-10-CM | POA: Diagnosis not present

## 2018-10-03 DIAGNOSIS — Z95828 Presence of other vascular implants and grafts: Secondary | ICD-10-CM

## 2018-10-03 DIAGNOSIS — Z171 Estrogen receptor negative status [ER-]: Secondary | ICD-10-CM | POA: Diagnosis not present

## 2018-10-03 DIAGNOSIS — D709 Neutropenia, unspecified: Secondary | ICD-10-CM | POA: Diagnosis not present

## 2018-10-03 DIAGNOSIS — C50411 Malignant neoplasm of upper-outer quadrant of right female breast: Secondary | ICD-10-CM

## 2018-10-03 DIAGNOSIS — I10 Essential (primary) hypertension: Secondary | ICD-10-CM | POA: Diagnosis not present

## 2018-10-03 DIAGNOSIS — R5383 Other fatigue: Secondary | ICD-10-CM

## 2018-10-03 DIAGNOSIS — R51 Headache: Secondary | ICD-10-CM

## 2018-10-03 DIAGNOSIS — Z5111 Encounter for antineoplastic chemotherapy: Secondary | ICD-10-CM | POA: Diagnosis not present

## 2018-10-03 DIAGNOSIS — N9489 Other specified conditions associated with female genital organs and menstrual cycle: Secondary | ICD-10-CM

## 2018-10-03 LAB — CBC WITH DIFFERENTIAL (CANCER CENTER ONLY)
Abs Immature Granulocytes: 0.44 10*3/uL — ABNORMAL HIGH (ref 0.00–0.07)
BASOS PCT: 1 %
Basophils Absolute: 0.1 10*3/uL (ref 0.0–0.1)
EOS ABS: 0 10*3/uL (ref 0.0–0.5)
EOS PCT: 0 %
HCT: 37.1 % (ref 36.0–46.0)
Hemoglobin: 12.5 g/dL (ref 12.0–15.0)
Immature Granulocytes: 6 %
Lymphocytes Relative: 13 %
Lymphs Abs: 0.9 10*3/uL (ref 0.7–4.0)
MCH: 31 pg (ref 26.0–34.0)
MCHC: 33.7 g/dL (ref 30.0–36.0)
MCV: 92.1 fL (ref 80.0–100.0)
MONO ABS: 0.5 10*3/uL (ref 0.1–1.0)
Monocytes Relative: 8 %
NEUTROS PCT: 72 %
Neutro Abs: 5 10*3/uL (ref 1.7–7.7)
PLATELETS: 72 10*3/uL — AB (ref 150–400)
RBC: 4.03 MIL/uL (ref 3.87–5.11)
RDW: 15.9 % — AB (ref 11.5–15.5)
WBC Count: 6.9 10*3/uL (ref 4.0–10.5)
nRBC: 0 % (ref 0.0–0.2)

## 2018-10-03 MED ORDER — TRIAMCINOLONE ACETONIDE 0.025 % EX OINT: 1.0000 "application " | TOPICAL_OINTMENT | Freq: Two times a day (BID) | CUTANEOUS | 0 refills | Status: DC

## 2018-10-03 MED ORDER — SODIUM CHLORIDE 0.9% FLUSH
10.0000 mL | INTRAVENOUS | Status: DC | PRN
  Administered 2018-10-03: 10 mL
  Filled 2018-10-03: qty 10

## 2018-10-03 NOTE — Assessment & Plan Note (Addendum)
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  Treatment plan: 1.Adjuvant chemotherapy with dose dense Adriamycin and Cytoxan x4 followed by Taxol weekly x12 2.Adjuvant radiation therapy  UPBEAT clinical trial ([id number]): No adverse effects being on the clinical trial _________________________________________________________________________________________ Current treatment: Cycle 3 day 1 dose dense Adriamycin and Cytoxan Chemo toxicities: Facial rash: sent in metrogel to apply daily, will add triamcinolone Thrombocytopenia: dose reduced further, will hold chemo x 1 week and patient will resume next week  Return to clinic in 1 week for labs and f/u along with her next cycle of Doxorubicin and Cyclophosphamide.

## 2018-10-03 NOTE — Telephone Encounter (Signed)
Per 10/23 los.  Gave patient avs and calendar for 10/30 appt and 11/13 appt.  Patient will see other appts made in my chart.  Per patient, cancel 11/6 appts.

## 2018-10-03 NOTE — Progress Notes (Addendum)
[facility] Follow up:    Jennifer Neer, MD [address] [place] [address]   DIAGNOSIS: Cancer Staging Malignant neoplasm of upper-outer quadrant of right breast in female, estrogen receptor negative (Jennifer Carroll) Staging form: Breast, AJCC 8th Edition - Clinical: Stage IB (cT1c, cN0, cM0, G2, ER-, PR-, HER2-) - Unsigned - Pathologic stage from 08/07/2018: Stage IB (pT1c, pN0, cM0, G2, ER-, PR-, HER2-) - Signed by Gardenia Phlegm, NP on 08/22/2018   SUMMARY OF ONCOLOGIC HISTORY:   Malignant neoplasm of upper-outer quadrant of right breast in female, estrogen receptor negative (Jennifer Carroll)   07/13/2018 Initial Diagnosis    Screening detected right breast asymmetry UOQ 10 o'clock position 1.4 cm, axilla negative, biopsy revealed grade 2 IDC triple negative with a Ki-67 of 50% T1c N0 stage Ib AJCC 8    08/06/2018 Genetic Testing    NBN c.2117C>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  The Hereditary Gene Panel offered by [facility] includes sequencing and/or deletion duplication testing of the following 47 genes: APC, ATM, AXIN2, BARD1, BMPR1A, BRCA1, BRCA2, BRIP1, CDH1, CDK4, CDKN2A (p14ARF), CDKN2A (p16INK4a), CHEK2, CTNNA1, DICER1, EPCAM (Deletion/duplication testing only), GREM1 (promoter region deletion/duplication testing only), KIT, MEN1, MLH1, MSH2, MSH3, MSH6, MUTYH, NBN, NF1, NHTL1, PALB2, PDGFRA, PMS2, POLD1, POLE, PTEN, RAD50, RAD51C, RAD51D, SDHB, SDHC, SDHD, SMAD4, SMARCA4. STK11, TP53, TSC1, TSC2, and VHL.  The following genes were evaluated for sequence changes only: SDHA and HOXB13 c.251G>A variant only. The report date is August 06, 2018.    08/07/2018 Surgery    Right lumpectomy: IDC grade 2, 1.5 cm, with intermediate grade DCIS, margins negative, 0/3 lymph nodes negative, triple negative with Ki-67 of 50%, T1 CN 0 stage IB    08/07/2018 Cancer Staging    Staging form:  Breast, AJCC 8th Edition - Pathologic stage from 08/07/2018: Stage IB (pT1c, pN0, cM0, G2, ER-, PR-, HER2-) - Signed by Gardenia Phlegm, NP on 08/22/2018    09/05/2018 -  Chemotherapy    Adriamycin and Cytoxan x 4 to be followed by weekly Taxol     CURRENT THERAPY: Adriamycin and Cytoxan  INTERVAL HISTORY: Jennifer Carroll 52 y.o. female returns for evaluation prior to receiving her third cycle of chemotherapy with Adriamycin/Cytoxan.  She tolerated her last cycle well with the exception of constipation the day of treatment.  She also notes a return and worsening of her facial rash.     Patient Active Problem List   Diagnosis Date Noted  . Port-A-Cath in place 09/12/2018  . 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 Genetic testing 08/06/2018  . Family history of pancreatic cancer   . Malignant neoplasm of upper-outer quadrant of right breast in female, estrogen receptor negative (Jennifer Carroll) 07/18/2018  . Irregular menses 05/08/2012    has No Known Allergies.  MEDICAL HISTORY: Past Medical History:  Diagnosis Date  . AMA (advanced maternal age) multigravida 35+ 03/28/05   with no amniocentesis  . 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 Cancer [facility])    Right breast   . Family history of pancreatic cancer   . H/O infertility   . H/O menorrhagia   . History of chicken pox   . History of measles, mumps, or rubella   . Hyperlipidemia   . Hypertension   . Low iron   . Menses, irregular   . 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   Jennifer Carroll     SURGICAL HISTORY: Past Surgical History:  Procedure Laterality Date  . BREAST LUMPECTOMY WITH RADIOACTIVE SEED AND SENTINEL LYMPH NODE BIOPSY Right 08/07/2018   Procedure: INJECT BLUE DYE RIGHT BREAST,RIGHT BREAST LUMPECTOMY WITH RADIOACTIVE SEED AND RIGHT AXILLARY DEEP  SENTINEL LYMPH NODE BIOPSY;  Surgeon: Fanny Skates, MD;  Location: [facility];  Service: General;  Laterality: Right;  . EYE SURGERY     left eye at age 80  . head surgery     to cover hole in head at age of  76 yrs young  . PORTACATH PLACEMENT Left 08/07/2018   Procedure: INSERTION PORT-A-CATH;  Surgeon: Fanny Skates, MD;  Location: [facility];  Service: General;  Laterality: Left;  . WISDOM TOOTH EXTRACTION  2001    SOCIAL HISTORY: Social History   Socioeconomic History  . Marital status: Married    Spouse name: Not on file  . Number of children: Not on file  . Years of education: Not on file  . Highest education level: Not on file  Occupational History  . Not on file  Social Needs  . Financial resource strain: Not on file  . Food insecurity:    Worry: Not on file    Inability: Not on file  . Transportation needs:    Medical: Not on file    Non-medical: Not on file  Tobacco Use  . Smoking status: Never Smoker  . Smokeless tobacco: Never Used  Substance and Sexual Activity  . Alcohol use: Not Currently  . Drug use: Never  . Sexual activity: Yes  Lifestyle  . Physical activity:    Days per week: Not on file    Minutes per session: Not on file  . Stress: Not on file  Relationships  . Social connections:    Talks on phone: Not on file    Gets together: Not on file    Attends religious service: Not on file    Active member of club or organization: Not on file    Attends meetings of clubs or organizations: Not on file    Relationship status: Not on file  . Intimate partner violence:    Fear of current or ex partner: Not on file    Emotionally abused: Not on file    Physically abused: Not on file    Forced sexual activity: Not on file  Other Topics Concern  . Not on file  Social History Narrative  . Not on file    FAMILY HISTORY: Family History  Problem Relation Age of Onset  . Hypertension Mother   . Kidney disease Mother    . Heart disease Father   . Hypertension Brother   . Kidney failure Maternal Grandfather   . Pancreatic cancer Cousin        dx >50    Review of Systems  Constitutional: Negative for appetite change, chills, fatigue, fever and unexpected weight change.  HENT:   Negative for hearing loss, lump/mass, sore throat and trouble swallowing.   Eyes: Negative for eye problems and icterus.  Respiratory: Negative for chest tightness, cough and shortness of breath.   Cardiovascular: Negative for chest pain, leg swelling and palpitations.  Gastrointestinal: Negative for abdominal distention, abdominal pain, constipation, diarrhea, nausea and vomiting.  Endocrine: Negative for hot flashes.  Musculoskeletal: Negative for arthralgias.  Skin: Positive for rash. Negative for  itching.  Neurological: Negative for dizziness, extremity weakness and numbness.  Psychiatric/Behavioral: Negative for depression. The patient is not nervous/anxious.       PHYSICAL EXAMINATION  ECOG PERFORMANCE STATUS: 1 - Symptomatic but completely ambulatory  Vitals:   10/03/18 1140  BP: (!) 151/98  Pulse: 79  Resp: 18  Temp: 98.6 F (37 C)  SpO2: 100%    Physical Exam  Constitutional: She is oriented to person, place, and time. She appears well-developed and well-nourished.  HENT:  Head: Normocephalic and atraumatic.  Mouth/Throat: Oropharynx is clear and moist. No oropharyngeal exudate.  Eyes: Pupils are equal, round, and reactive to light. No scleral icterus.  Neck: Neck supple.  Cardiovascular: Normal rate, regular rhythm and normal heart sounds.  Pulmonary/Chest: Effort normal and breath sounds normal.  Abdominal: Soft. Bowel sounds are normal.  Musculoskeletal: She exhibits no edema.  Lymphadenopathy:    She has no cervical adenopathy.  Neurological: She is alert and oriented to person, place, and time.  Skin: Skin is warm and dry. Capillary refill takes less than 2 seconds.  Psychiatric: She has a  normal mood and affect.    LABORATORY DATA:  CBC    Component Value Date/Time   WBC 6.9 10/03/2018 1040   RBC 4.03 10/03/2018 1040   HGB 12.5 10/03/2018 1040   HCT 37.1 10/03/2018 1040   PLT 72 (L) 10/03/2018 1040   MCV 92.1 10/03/2018 1040   MCH 31.0 10/03/2018 1040   MCHC 33.7 10/03/2018 1040   RDW 15.9 (H) 10/03/2018 1040   LYMPHSABS 0.9 10/03/2018 1040   MONOABS 0.5 10/03/2018 1040   EOSABS 0.0 10/03/2018 1040   BASOSABS 0.1 10/03/2018 1040    CMP     Component Value Date/Time   NA 142 09/19/2018 0832   K 4.1 09/19/2018 0832   CL 109 09/19/2018 0832   CO2 24 09/19/2018 0832   GLUCOSE 88 09/19/2018 0832   BUN 11 09/19/2018 0832   CREATININE 0.77 09/19/2018 0832   CALCIUM 9.6 09/19/2018 0832   PROT 7.3 09/19/2018 0832   ALBUMIN 3.9 09/19/2018 0832   AST 17 09/19/2018 0832   ALT 19 09/19/2018 0832   ALKPHOS 113 09/19/2018 0832   BILITOT 0.2 (L) 09/19/2018 0832   GFRNONAA >60 09/19/2018 0832   GFRAA >60 09/19/2018 0832     ASSESSMENT and PLAN:   Malignant neoplasm of upper-outer quadrant of right breast in female, estrogen receptor negative ([place]) 08/07/2018:Right lumpectomy: IDC grade 2, 1.5 cm, with intermediate grade DCIS, margins negative, 0/3 lymph nodes negative, triple negative with Ki-67 of 50%, T1 CN 0 stage IB Patient works at [employer] and can work from home if necessary.  Treatment plan: 1.Adjuvant chemotherapy with dose dense Adriamycin and Cytoxan x4 followed by Taxol weekly x12 2.Adjuvant radiation therapy  UPBEAT clinical trial ([id number]): No adverse effects being on the clinical trial _________________________________________________________________________________________ Current treatment: Cycle 3 day 1 dose dense Adriamycin and Cytoxan Chemo toxicities: Facial rash: sent in metrogel to apply daily, will add triamcinolone Thrombocytopenia: dose reduced further, will hold chemo x 1 week and patient will resume next  week  Return to clinic in 1 week for labs and f/u along with her next cycle of Doxorubicin and Cyclophosphamide.   All questions were answered. The patient knows to call the clinic with any problems, questions or concerns. We can certainly see the patient much sooner if necessary.  A total of (30) minutes of face-to-face time was spent with this patient with greater than  50% of that time in counseling and care-coordination.  This note was electronically signed. Scot Dock, NP 10/03/2018   Attending Note  I personally saw and examined Kelley Ringstad. The plan of care was discussed with her. I agree with the assessment and plan as documented above. Right breast cancer: On adjuvant chemotherapy today is supposed to be cycle 3 chemo.  Unfortunately because of her blood counts not being adequate we are postponing her chemotherapy by 1 week.  We will also reduce the dosage of chemotherapy for the next cycle. Rash on the face: She is on metronidazole gel.  I also recommended cortisone cream. Thrombocytopenia Return to clinic in 1 week for cycle 3. Signed Harriette Ohara, MD

## 2018-10-10 ENCOUNTER — Inpatient Hospital Stay (HOSPITAL_BASED_OUTPATIENT_CLINIC_OR_DEPARTMENT_OTHER): Payer: BLUE CROSS/BLUE SHIELD | Admitting: Hematology and Oncology

## 2018-10-10 ENCOUNTER — Telehealth: Payer: Self-pay | Admitting: Hematology and Oncology

## 2018-10-10 ENCOUNTER — Inpatient Hospital Stay: Payer: BLUE CROSS/BLUE SHIELD

## 2018-10-10 DIAGNOSIS — C50411 Malignant neoplasm of upper-outer quadrant of right female breast: Secondary | ICD-10-CM

## 2018-10-10 DIAGNOSIS — R51 Headache: Secondary | ICD-10-CM | POA: Diagnosis not present

## 2018-10-10 DIAGNOSIS — Z800 Family history of malignant neoplasm of digestive organs: Secondary | ICD-10-CM

## 2018-10-10 DIAGNOSIS — R21 Rash and other nonspecific skin eruption: Secondary | ICD-10-CM | POA: Diagnosis not present

## 2018-10-10 DIAGNOSIS — Z5111 Encounter for antineoplastic chemotherapy: Secondary | ICD-10-CM | POA: Diagnosis not present

## 2018-10-10 DIAGNOSIS — Z171 Estrogen receptor negative status [ER-]: Secondary | ICD-10-CM | POA: Diagnosis not present

## 2018-10-10 DIAGNOSIS — R5383 Other fatigue: Secondary | ICD-10-CM | POA: Diagnosis not present

## 2018-10-10 DIAGNOSIS — N9489 Other specified conditions associated with female genital organs and menstrual cycle: Secondary | ICD-10-CM

## 2018-10-10 DIAGNOSIS — E785 Hyperlipidemia, unspecified: Secondary | ICD-10-CM

## 2018-10-10 DIAGNOSIS — D709 Neutropenia, unspecified: Secondary | ICD-10-CM | POA: Diagnosis not present

## 2018-10-10 DIAGNOSIS — I10 Essential (primary) hypertension: Secondary | ICD-10-CM | POA: Diagnosis not present

## 2018-10-10 DIAGNOSIS — Z95828 Presence of other vascular implants and grafts: Secondary | ICD-10-CM

## 2018-10-10 LAB — CBC WITH DIFFERENTIAL (CANCER CENTER ONLY)
Abs Immature Granulocytes: 0.03 10*3/uL (ref 0.00–0.07)
BASOS PCT: 1 %
Basophils Absolute: 0 10*3/uL (ref 0.0–0.1)
Eosinophils Absolute: 0 10*3/uL (ref 0.0–0.5)
Eosinophils Relative: 1 %
HCT: 37.6 % (ref 36.0–46.0)
HEMOGLOBIN: 12.5 g/dL (ref 12.0–15.0)
Immature Granulocytes: 1 %
LYMPHS PCT: 22 %
Lymphs Abs: 0.9 10*3/uL (ref 0.7–4.0)
MCH: 31 pg (ref 26.0–34.0)
MCHC: 33.2 g/dL (ref 30.0–36.0)
MCV: 93.3 fL (ref 80.0–100.0)
MONO ABS: 0.6 10*3/uL (ref 0.1–1.0)
MONOS PCT: 14 %
NEUTROS ABS: 2.6 10*3/uL (ref 1.7–7.7)
Neutrophils Relative %: 61 %
Platelet Count: 196 10*3/uL (ref 150–400)
RBC: 4.03 MIL/uL (ref 3.87–5.11)
RDW: 17 % — ABNORMAL HIGH (ref 11.5–15.5)
WBC Count: 4.1 10*3/uL (ref 4.0–10.5)
nRBC: 0 % (ref 0.0–0.2)

## 2018-10-10 LAB — CMP (CANCER CENTER ONLY)
ALK PHOS: 85 U/L (ref 38–126)
ALT: 35 U/L (ref 0–44)
AST: 28 U/L (ref 15–41)
Albumin: 3.7 g/dL (ref 3.5–5.0)
Anion gap: 9 (ref 5–15)
BUN: 11 mg/dL (ref 6–20)
CALCIUM: 9 mg/dL (ref 8.9–10.3)
CO2: 25 mmol/L (ref 22–32)
CREATININE: 0.86 mg/dL (ref 0.44–1.00)
Chloride: 110 mmol/L (ref 98–111)
Glucose, Bld: 130 mg/dL — ABNORMAL HIGH (ref 70–99)
Potassium: 3.8 mmol/L (ref 3.5–5.1)
Sodium: 144 mmol/L (ref 135–145)
Total Bilirubin: 0.3 mg/dL (ref 0.3–1.2)
Total Protein: 6.6 g/dL (ref 6.5–8.1)

## 2018-10-10 MED ORDER — SODIUM CHLORIDE 0.9% FLUSH
10.0000 mL | INTRAVENOUS | Status: DC | PRN
  Administered 2018-10-10: 10 mL
  Filled 2018-10-10: qty 10

## 2018-10-10 MED ORDER — PALONOSETRON HCL INJECTION 0.25 MG/5ML
INTRAVENOUS | Status: AC
  Filled 2018-10-10: qty 5

## 2018-10-10 MED ORDER — PALONOSETRON HCL INJECTION 0.25 MG/5ML
0.2500 mg | Freq: Once | INTRAVENOUS | Status: AC
  Administered 2018-10-10: 0.25 mg via INTRAVENOUS

## 2018-10-10 MED ORDER — SODIUM CHLORIDE 0.9 % IV SOLN
400.0000 mg/m2 | Freq: Once | INTRAVENOUS | Status: AC
  Administered 2018-10-10: 900 mg via INTRAVENOUS
  Filled 2018-10-10: qty 45

## 2018-10-10 MED ORDER — SODIUM CHLORIDE 0.9 % IV SOLN
Freq: Once | INTRAVENOUS | Status: AC
  Administered 2018-10-10: 15:00:00 via INTRAVENOUS
  Filled 2018-10-10: qty 5

## 2018-10-10 MED ORDER — HEPARIN SOD (PORK) LOCK FLUSH 100 UNIT/ML IV SOLN
500.0000 [IU] | Freq: Once | INTRAVENOUS | Status: AC | PRN
  Administered 2018-10-10: 500 [IU]
  Filled 2018-10-10: qty 5

## 2018-10-10 MED ORDER — DOXORUBICIN HCL CHEMO IV INJECTION 2 MG/ML
40.0000 mg/m2 | Freq: Once | INTRAVENOUS | Status: AC
  Administered 2018-10-10: 90 mg via INTRAVENOUS
  Filled 2018-10-10: qty 45

## 2018-10-10 MED ORDER — SODIUM CHLORIDE 0.9 % IV SOLN
Freq: Once | INTRAVENOUS | Status: AC
  Administered 2018-10-10: 15:00:00 via INTRAVENOUS
  Filled 2018-10-10: qty 250

## 2018-10-10 NOTE — Telephone Encounter (Signed)
No 10/30 los orders.

## 2018-10-10 NOTE — Assessment & Plan Note (Signed)
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  Treatment plan: 1.Adjuvant chemotherapy with dose dense Adriamycin and Cytoxan x4 followed by Taxol weekly x12 2.Adjuvant radiation therapy  UPBEAT clinical trial ([id number]): No adverse effects being on the clinical trial _________________________________________________________________________________________ Current treatment: Cycle 3 day 1 dose dense Adriamycin and Cytoxan (chemo was held last week for thrombocytopenia)  Chemo toxicities: 1. Facial rash: sent in metrogel to apply daily, will add triamcinolone 2. Thrombocytopenia: dose reduced further  Return to clinic in 2 weeks for cycle 4

## 2018-10-10 NOTE — Addendum Note (Signed)
Addended by: Nicholas Lose on: 10/10/2018 02:58 PM   Modules accepted: Orders

## 2018-10-10 NOTE — Progress Notes (Signed)
Patient Care Team: [name], MD as PCP - General (Family Medicine) Fanny Skates, MD as Consulting Physician (General Surgery) Nicholas Lose, MD as Consulting Physician (Hematology and Oncology) Kyung Rudd, MD as Consulting Physician (Radiation Oncology)  DIAGNOSIS:  Encounter Diagnosis  Name Primary?  . Malignant neoplasm of upper-outer quadrant of right breast in female, estrogen receptor negative ([place])     SUMMARY OF ONCOLOGIC HISTORY:   Malignant neoplasm of upper-outer quadrant of right breast in female, estrogen receptor negative ([place])   07/13/2018 Initial Diagnosis    Screening detected right breast asymmetry UOQ 10 o'clock position 1.4 cm, axilla negative, biopsy revealed grade 2 IDC triple negative with a Ki-67 of 50% T1c N0 stage Ib AJCC 8    08/06/2018 Genetic Testing    NBN c.2117C>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  The Hereditary Gene Panel offered by [facility] includes sequencing and/or deletion duplication testing of the following 47 genes: APC, ATM, AXIN2, BARD1, BMPR1A, BRCA1, BRCA2, BRIP1, CDH1, CDK4, CDKN2A (p14ARF), CDKN2A (p16INK4a), CHEK2, CTNNA1, DICER1, EPCAM (Deletion/duplication testing only), GREM1 (promoter region deletion/duplication testing only), KIT, MEN1, MLH1, MSH2, MSH3, MSH6, MUTYH, NBN, NF1, NHTL1, PALB2, PDGFRA, PMS2, POLD1, POLE, PTEN, RAD50, RAD51C, RAD51D, SDHB, SDHC, SDHD, SMAD4, SMARCA4. STK11, TP53, TSC1, TSC2, and VHL.  The following genes were evaluated for sequence changes only: SDHA and HOXB13 c.251G>A variant only. The report date is August 06, 2018.    08/07/2018 Surgery    Right lumpectomy: IDC grade 2, 1.5 cm, with intermediate grade DCIS, margins negative, 0/3 lymph nodes negative, triple negative with Ki-67 of 50%, T1 CN 0 stage IB    08/07/2018 Cancer Staging    Staging form: Breast, AJCC 8th Edition - Pathologic stage from 08/07/2018: Stage IB  (pT1c, pN0, cM0, G2, ER-, PR-, HER2-) - Signed by Gardenia Phlegm, NP on 08/22/2018    09/05/2018 -  Chemotherapy    Adriamycin and Cytoxan x 4 to be followed by weekly Taxol     CHIEF COMPLIANT: Cycle 3 dose dense Adriamycin and Cytoxan  INTERVAL HISTORY: Gladys Deckard is a 52 year old with above-mentioned history of right breast cancer currently on adjuvant chemotherapy today cycle 3 of dose in 6 months and Cytoxan.  Last week chemo was held because of thrombocytopenia and she is here today to recheck her blood work.  She reports no new symptoms or concerns other than fatigue.  Denies any nausea or vomiting.  Denies any diarrhea or constipation.  REVIEW OF SYSTEMS:   Constitutional: Fatigue Eyes: Denies blurriness of vision Ears, nose, mouth, throat, and face: Denies mucositis or sore throat Respiratory: Denies cough, dyspnea or wheezes Cardiovascular: Denies palpitation, chest discomfort Gastrointestinal:  Denies nausea, heartburn or change in bowel habits Skin: Denies abnormal skin rashes Lymphatics: Denies new lymphadenopathy or easy bruising Neurological:Denies numbness, tingling or new weaknesses Behavioral/Psych: Mood is stable, no new changes  Extremities: No lower extremity edema   All other systems were reviewed with the patient and are negative.  I have reviewed the past medical history, past surgical history, social history and family history with the patient and they are unchanged from previous note.  ALLERGIES:  has No Known Allergies.  MEDICATIONS:  Current Outpatient Medications  Medication Sig Dispense Refill  . HYDROcodone-acetaminophen (NORCO) 5-325 MG tablet Take 1-2 tablets by mouth every 6 (six) hours as needed for moderate pain or severe pain. 20 tablet 0  . ibuprofen (ADVIL,MOTRIN) 200 MG tablet Take 200 mg  by mouth every 6 (six) hours as needed. Ibuprofen 200 mg 2 tablets As needed.    . lidocaine-prilocaine (EMLA) cream Apply to affected area once  30 g 3  . LORazepam (ATIVAN) 0.5 MG tablet Take 1 tablet (0.5 mg total) by mouth at bedtime as needed for sleep. 30 tablet 0  . LOSARTAN POTASSIUM PO Take 50 mg by mouth daily.    . metroNIDAZOLE (METROGEL) 1 % gel Apply topically daily. 45 g 0  . Multiple Vitamin (MULTI-VITAMIN DAILY PO) Take by mouth.    . norethindrone (CAMILA) 0.35 MG tablet Take 1 tablet (0.35 mg total) by mouth daily. 3 Package 3  . nystatin (MYCOSTATIN/NYSTOP) powder Apply topically 4 (four) times daily. On affected area. (Breast rash and toes ) 30 g 1  . ondansetron (ZOFRAN) 8 MG tablet Take 1 tablet (8 mg total) by mouth 2 (two) times daily as needed. Start on the third day after chemotherapy. 30 tablet 1  . prochlorperazine (COMPAZINE) 10 MG tablet Take 1 tablet (10 mg total) by mouth every 6 (six) hours as needed (Nausea or vomiting). 30 tablet 1  . simvastatin (ZOCOR) 20 MG tablet Take 20 mg by mouth every evening.    . triamcinolone (KENALOG) 0.025 % ointment Apply 1 application topically 2 (two) times daily. 30 g 0  . UDENYCA 6 MG/0.6ML injection      No current facility-administered medications for this visit.     PHYSICAL EXAMINATION: ECOG PERFORMANCE STATUS: 1 - Symptomatic but completely ambulatory  Vitals:   10/10/18 1110  BP: (!) 139/94  Pulse: 90  Resp: 18  Temp: 98.4 F (36.9 C)  SpO2: 100%   Filed Weights   10/10/18 1110  Weight: 232 lb 14.4 oz (105.6 kg)    GENERAL:alert, no distress and comfortable SKIN: skin color, texture, turgor are normal, no rashes or significant lesions EYES: normal, Conjunctiva are pink and non-injected, sclera clear OROPHARYNX:no exudate, no erythema and lips, buccal mucosa, and tongue normal  NECK: supple, thyroid normal size, non-tender, without nodularity LYMPH:  no palpable lymphadenopathy in the cervical, axillary or inguinal LUNGS: clear to auscultation and percussion with normal breathing effort HEART: regular rate & rhythm and no murmurs and no lower  extremity edema ABDOMEN:abdomen soft, non-tender and normal bowel sounds MUSCULOSKELETAL:no cyanosis of digits and no clubbing  NEURO: alert & oriented x 3 with fluent speech, no focal motor/sensory deficits EXTREMITIES: No lower extremity edema    LABORATORY DATA:  I have reviewed the data as listed CMP Latest Ref Rng & Units 10/10/2018 09/19/2018 09/12/2018  Glucose 70 - 99 mg/dL 130(H) 88 80  BUN 6 - 20 mg/dL _0 Creatinine 0.44 - 1.00 mg/dL 0.86 0.77 0.76  Sodium 135 - 145 mmol/L 144 142 141  Potassium 3.5 - 5.1 mmol/L 3.8 4.1 4.2  Chloride 98 - 111 mmol/L 110 109 107  CO2 22 - 32 mmol/L _1 Calcium 8.9 - 10.3 mg/dL 9.0 9.6 9.1  Total Protein 6.5 - 8.1 g/dL 6.6 7.3 6.9  Total Bilirubin 0.3 - 1.2 mg/dL 0.3 0.2(L) 0.5  Alkaline Phos 38 - 126 U/L 85 113 95  AST 15 - 41 U/L _2 ALT 0 - 44 U/L 35 19 10    Lab Results  Component Value Date   WBC 4.1 10/10/2018   HGB 12.5 10/10/2018   HCT 37.6 10/10/2018   MCV 93.3 10/10/2018   PLT 196 10/10/2018   NEUTROABS 2.6 10/10/2018  ASSESSMENT & PLAN:  Malignant neoplasm of upper-outer quadrant of right breast in female, estrogen receptor negative ([place]) 08/07/2018:Right lumpectomy: IDC grade 2, 1.5 cm, with intermediate grade DCIS, margins negative, 0/3 lymph nodes negative, triple negative with Ki-67 of 50%, T1 CN 0 stage IB Patient works at [employer] and can work from home if necessary.  Treatment plan: 1.Adjuvant chemotherapy with dose dense Adriamycin and Cytoxan x4 followed by Taxol weekly x12 2.Adjuvant radiation therapy  UPBEAT clinical trial ([id number]): No adverse effects being on the clinical trial _________________________________________________________________________________________ Current treatment: Cycle 3 day 1 dose dense Adriamycin and Cytoxan (chemo was held last week for thrombocytopenia)  Chemo toxicities: 1. Facial rash: sent in metrogel to apply daily, will add  triamcinolone 2. Thrombocytopenia: dose reduced further Today's platelet count has improved. Patient gives her own injections for Neulasta. Return to clinic in 2 weeks for cycle 4    No orders of the defined types were placed in this encounter.  The patient has a good understanding of the overall plan. she agrees with it. she will call with any problems that may develop before the next visit here.   Harriette Ohara, MD 10/10/18

## 2018-10-10 NOTE — Patient Instructions (Signed)
[facility] Discharge Instructions for Patients Receiving Chemotherapy  Today you received the following chemotherapy agents doxorubicin (Adriamycin) and cyclophosphamide (Cytoxan).   To help prevent nausea and vomiting after your treatment, we encourage you to take your nausea medication as prescribed.    If you develop nausea and vomiting that is not controlled by your nausea medication, call the clinic.   BELOW ARE SYMPTOMS THAT SHOULD BE REPORTED IMMEDIATELY:  *FEVER GREATER THAN 100.5 F  *CHILLS WITH OR WITHOUT FEVER  NAUSEA AND VOMITING THAT IS NOT CONTROLLED WITH YOUR NAUSEA MEDICATION  *UNUSUAL SHORTNESS OF BREATH  *UNUSUAL BRUISING OR BLEEDING  TENDERNESS IN MOUTH AND THROAT WITH OR WITHOUT PRESENCE OF ULCERS  *URINARY PROBLEMS  *BOWEL PROBLEMS  UNUSUAL RASH Items with * indicate a potential emergency and should be followed up as soon as possible.  Feel free to call the clinic should you have any questions or concerns. The clinic phone number is [phone].  Please show the CHEMO ALERT CARD at check-in to the Emergency Department and triage nurse. 

## 2018-10-11 ENCOUNTER — Encounter: Payer: Self-pay | Admitting: Hematology and Oncology

## 2018-10-17 ENCOUNTER — Other Ambulatory Visit: Payer: BLUE CROSS/BLUE SHIELD

## 2018-10-17 ENCOUNTER — Ambulatory Visit: Payer: BLUE CROSS/BLUE SHIELD

## 2018-10-17 MED FILL — LOSARTAN POTASSIUM 50 MG TA: 50 | 30 days supply | Qty: 30 | Fill #2

## 2018-10-24 ENCOUNTER — Inpatient Hospital Stay: Payer: BLUE CROSS/BLUE SHIELD

## 2018-10-24 ENCOUNTER — Encounter: Payer: Self-pay | Admitting: Adult Health

## 2018-10-24 ENCOUNTER — Encounter: Payer: Self-pay | Admitting: *Deleted

## 2018-10-24 ENCOUNTER — Inpatient Hospital Stay (HOSPITAL_BASED_OUTPATIENT_CLINIC_OR_DEPARTMENT_OTHER): Payer: BLUE CROSS/BLUE SHIELD | Admitting: Adult Health

## 2018-10-24 ENCOUNTER — Telehealth: Payer: Self-pay | Admitting: Adult Health

## 2018-10-24 ENCOUNTER — Inpatient Hospital Stay: Payer: BLUE CROSS/BLUE SHIELD | Attending: Hematology and Oncology

## 2018-10-24 VITALS — BP 143/91 | HR 80 | Temp 98.4°F | Resp 18 | Ht 66.5 in | Wt 231.3 lb

## 2018-10-24 DIAGNOSIS — N920 Excessive and frequent menstruation with regular cycle: Secondary | ICD-10-CM

## 2018-10-24 DIAGNOSIS — C50411 Malignant neoplasm of upper-outer quadrant of right female breast: Secondary | ICD-10-CM

## 2018-10-24 DIAGNOSIS — Z800 Family history of malignant neoplasm of digestive organs: Secondary | ICD-10-CM

## 2018-10-24 DIAGNOSIS — R21 Rash and other nonspecific skin eruption: Secondary | ICD-10-CM | POA: Insufficient documentation

## 2018-10-24 DIAGNOSIS — E785 Hyperlipidemia, unspecified: Secondary | ICD-10-CM

## 2018-10-24 DIAGNOSIS — K5900 Constipation, unspecified: Secondary | ICD-10-CM | POA: Diagnosis not present

## 2018-10-24 DIAGNOSIS — Z171 Estrogen receptor negative status [ER-]: Secondary | ICD-10-CM | POA: Insufficient documentation

## 2018-10-24 DIAGNOSIS — Z5111 Encounter for antineoplastic chemotherapy: Secondary | ICD-10-CM | POA: Diagnosis not present

## 2018-10-24 DIAGNOSIS — D696 Thrombocytopenia, unspecified: Secondary | ICD-10-CM | POA: Diagnosis not present

## 2018-10-24 DIAGNOSIS — R5383 Other fatigue: Secondary | ICD-10-CM

## 2018-10-24 DIAGNOSIS — I10 Essential (primary) hypertension: Secondary | ICD-10-CM

## 2018-10-24 DIAGNOSIS — Z95828 Presence of other vascular implants and grafts: Secondary | ICD-10-CM

## 2018-10-24 LAB — CBC WITH DIFFERENTIAL (CANCER CENTER ONLY)
Abs Immature Granulocytes: 0.28 10*3/uL — ABNORMAL HIGH (ref 0.00–0.07)
BASOS ABS: 0.1 10*3/uL (ref 0.0–0.1)
BASOS PCT: 1 %
EOS ABS: 0 10*3/uL (ref 0.0–0.5)
EOS PCT: 1 %
HEMATOCRIT: 40.1 % (ref 36.0–46.0)
Hemoglobin: 13.2 g/dL (ref 12.0–15.0)
Immature Granulocytes: 4 %
LYMPHS ABS: 0.8 10*3/uL (ref 0.7–4.0)
Lymphocytes Relative: 10 %
MCH: 31.4 pg (ref 26.0–34.0)
MCHC: 32.9 g/dL (ref 30.0–36.0)
MCV: 95.5 fL (ref 80.0–100.0)
Monocytes Absolute: 0.6 10*3/uL (ref 0.1–1.0)
Monocytes Relative: 8 %
NRBC: 0 % (ref 0.0–0.2)
Neutro Abs: 5.6 10*3/uL (ref 1.7–7.7)
Neutrophils Relative %: 76 %
PLATELETS: 117 10*3/uL — AB (ref 150–400)
RBC: 4.2 MIL/uL (ref 3.87–5.11)
RDW: 17.4 % — AB (ref 11.5–15.5)
WBC Count: 7.3 10*3/uL (ref 4.0–10.5)

## 2018-10-24 LAB — CMP (CANCER CENTER ONLY)
ALK PHOS: 108 U/L (ref 38–126)
ALT: 21 U/L (ref 0–44)
ANION GAP: 7 (ref 5–15)
AST: 17 U/L (ref 15–41)
Albumin: 3.9 g/dL (ref 3.5–5.0)
BILIRUBIN TOTAL: 0.3 mg/dL (ref 0.3–1.2)
BUN: 13 mg/dL (ref 6–20)
CALCIUM: 9.5 mg/dL (ref 8.9–10.3)
CO2: 26 mmol/L (ref 22–32)
CREATININE: 0.76 mg/dL (ref 0.44–1.00)
Chloride: 109 mmol/L (ref 98–111)
Glucose, Bld: 96 mg/dL (ref 70–99)
Potassium: 4 mmol/L (ref 3.5–5.1)
Sodium: 142 mmol/L (ref 135–145)
TOTAL PROTEIN: 7.1 g/dL (ref 6.5–8.1)

## 2018-10-24 MED ORDER — DOXORUBICIN HCL CHEMO IV INJECTION 2 MG/ML
40.0000 mg/m2 | Freq: Once | INTRAVENOUS | Status: AC
  Administered 2018-10-24: 90 mg via INTRAVENOUS
  Filled 2018-10-24: qty 45

## 2018-10-24 MED ORDER — PALONOSETRON HCL INJECTION 0.25 MG/5ML
0.2500 mg | Freq: Once | INTRAVENOUS | Status: AC
  Administered 2018-10-24: 0.25 mg via INTRAVENOUS

## 2018-10-24 MED ORDER — SODIUM CHLORIDE 0.9% FLUSH
10.0000 mL | INTRAVENOUS | Status: DC | PRN
  Administered 2018-10-24: 10 mL
  Filled 2018-10-24: qty 10

## 2018-10-24 MED ORDER — SODIUM CHLORIDE 0.9 % IV SOLN
400.0000 mg/m2 | Freq: Once | INTRAVENOUS | Status: AC
  Administered 2018-10-24: 900 mg via INTRAVENOUS
  Filled 2018-10-24: qty 45

## 2018-10-24 MED ORDER — SODIUM CHLORIDE 0.9 % IV SOLN
Freq: Once | INTRAVENOUS | Status: AC
  Administered 2018-10-24: 10:00:00 via INTRAVENOUS
  Filled 2018-10-24: qty 5

## 2018-10-24 MED ORDER — PALONOSETRON HCL INJECTION 0.25 MG/5ML
INTRAVENOUS | Status: AC
  Filled 2018-10-24: qty 5

## 2018-10-24 MED ORDER — HEPARIN SOD (PORK) LOCK FLUSH 100 UNIT/ML IV SOLN
500.0000 [IU] | Freq: Once | INTRAVENOUS | Status: AC | PRN
  Administered 2018-10-24: 500 [IU]
  Filled 2018-10-24: qty 5

## 2018-10-24 MED ORDER — SODIUM CHLORIDE 0.9 % IV SOLN
Freq: Once | INTRAVENOUS | Status: AC
  Administered 2018-10-24: 10:00:00 via INTRAVENOUS
  Filled 2018-10-24: qty 250

## 2018-10-24 NOTE — Progress Notes (Signed)
[facility] Follow up:    Jennifer Neer, MD [address] [place]  [address]   DIAGNOSIS: Cancer Staging Malignant neoplasm of upper-outer quadrant of right breast in female, estrogen receptor negative ([place]) Staging form: Breast, AJCC 8th Edition - Clinical: Stage IB (cT1c, cN0, cM0, G2, ER-, PR-, HER2-) - Unsigned - Pathologic stage from 08/07/2018: Stage IB (pT1c, pN0, cM0, G2, ER-, PR-, HER2-) - Signed by Gardenia Phlegm, NP on 08/22/2018   SUMMARY OF ONCOLOGIC HISTORY:   Malignant neoplasm of upper-outer quadrant of right breast in female, estrogen receptor negative ([place])   07/13/2018 Initial Diagnosis    Screening detected right breast asymmetry UOQ 10 o'clock position 1.4 cm, axilla negative, biopsy revealed grade 2 IDC triple negative with a Ki-67 of 50% T1c N0 stage Ib AJCC 8    08/06/2018 Genetic Testing    NBN c.2117C>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  The Hereditary Gene Panel offered by [facility] includes sequencing and/or deletion duplication testing of the following 47 genes: APC, ATM, AXIN2, BARD1, BMPR1A, BRCA1, BRCA2, BRIP1, CDH1, CDK4, CDKN2A (p14ARF), CDKN2A (p16INK4a), CHEK2, CTNNA1, DICER1, EPCAM (Deletion/duplication testing only), GREM1 (promoter region deletion/duplication testing only), KIT, MEN1, MLH1, MSH2, MSH3, MSH6, MUTYH, NBN, NF1, NHTL1, PALB2, PDGFRA, PMS2, POLD1, POLE, PTEN, RAD50, RAD51C, RAD51D, SDHB, SDHC, SDHD, SMAD4, SMARCA4. STK11, TP53, TSC1, TSC2, and VHL.  The following genes were evaluated for sequence changes only: SDHA and HOXB13 c.251G>A variant only. The report date is August 06, 2018.    08/07/2018 Surgery    Right lumpectomy: IDC grade 2, 1.5 cm, with intermediate grade DCIS, margins negative, 0/3 lymph nodes negative, triple negative with Ki-67 of 50%, T1 CN 0 stage IB    08/07/2018 Cancer Staging    Staging form:  Breast, AJCC 8th Edition - Pathologic stage from 08/07/2018: Stage IB (pT1c, pN0, cM0, G2, ER-, PR-, HER2-) - Signed by Gardenia Phlegm, NP on 08/22/2018    09/05/2018 -  Chemotherapy    Adriamycin and Cytoxan x 4 to be followed by weekly Taxol     CURRENT THERAPY: Adriamycin/Cytoxan  INTERVAL HISTORY: Jennifer Carroll 52 y.o. female returns for evaluation prior to receiving her fourth cycle of adjuvant chemotherapy with Adriamycin and Cytoxan.  She is tolerating the therapy well and denies any issues today other than facial rash that improves with metrogel.   Patient Active Problem List   Diagnosis Date Noted  . Port-A-Cath in place 09/12/2018  . 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 Genetic testing 08/06/2018  . Family history of pancreatic cancer   . Malignant neoplasm of upper-outer quadrant of right breast in female, estrogen receptor negative ([place]) 07/18/2018  . Irregular menses 05/08/2012    has No Known Allergies.  MEDICAL HISTORY: Past Medical History:  Diagnosis Date  . AMA (advanced maternal age) multigravida 35+ 03/28/05   with no amniocentesis  . 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 Cancer [facility])    Right breast   . Family history of pancreatic cancer   . H/O infertility   . H/O menorrhagia   . History of chicken pox   . History of measles, mumps, or rubella   . Hyperlipidemia   . Hypertension   . Low iron   . Menses, irregular   . 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   Jennifer Carroll     SURGICAL HISTORY: Past Surgical History:  Procedure Laterality Date  . BREAST LUMPECTOMY WITH RADIOACTIVE SEED AND SENTINEL LYMPH NODE BIOPSY Right 08/07/2018   Procedure: INJECT BLUE DYE RIGHT BREAST,RIGHT BREAST LUMPECTOMY WITH RADIOACTIVE SEED AND RIGHT AXILLARY DEEP SENTINEL LYMPH NODE BIOPSY;   Surgeon: Fanny Skates, MD;  Location: [place];  Service: General;  Laterality: Right;  . EYE SURGERY     left eye at age 35  . head surgery     to cover hole in head at age of  36 yrs young  . PORTACATH PLACEMENT Left 08/07/2018   Procedure: INSERTION PORT-A-CATH;  Surgeon: Fanny Skates, MD;  Location: [place];  Service: General;  Laterality: Left;  . WISDOM TOOTH EXTRACTION  2001    SOCIAL HISTORY: Social History   Socioeconomic History  . Marital status: Married    Spouse name: Not on file  . Number of children: Not on file  . Years of education: Not on file  . Highest education level: Not on file  Occupational History  . Not on file  Social Needs  . Financial resource strain: Not on file  . Food insecurity:    Worry: Not on file    Inability: Not on file  . Transportation needs:    Medical: Not on file    Non-medical: Not on file  Tobacco Use  . Smoking status: Never Smoker  . Smokeless tobacco: Never Used  Substance and Sexual Activity  . Alcohol use: Not Currently  . Drug use: Never  . Sexual activity: Yes  Lifestyle  . Physical activity:    Days per week: Not on file    Minutes per session: Not on file  . Stress: Not on file  Relationships  . Social connections:    Talks on phone: Not on file    Gets together: Not on file    Attends religious service: Not on file    Active member of club or organization: Not on file    Attends meetings of clubs or organizations: Not on file    Relationship status: Not on file  . Intimate partner violence:    Fear of current or ex partner: Not on file    Emotionally abused: Not on file    Physically abused: Not on file    Forced sexual activity: Not on file  Other Topics Concern  . Not on file  Social History Narrative  . Not on file    FAMILY HISTORY: Family History  Problem Relation Age of Onset  . Hypertension Mother   . Kidney disease Mother   . Heart disease Father   .  Hypertension Brother   . Kidney failure Maternal Grandfather   . Pancreatic cancer Cousin        dx >50    Review of Systems  Constitutional: Positive for fatigue. Negative for appetite change, chills, fever and unexpected weight change.  HENT:   Negative for hearing loss, lump/mass and sore throat.   Eyes: Negative for eye problems and icterus.  Respiratory: Negative for chest tightness, cough and shortness of breath.   Cardiovascular: Negative for chest pain, leg swelling and palpitations.  Gastrointestinal: Negative for abdominal distention, abdominal pain, constipation and diarrhea.  Endocrine: Negative for hot flashes.  Genitourinary: Negative for difficulty urinating.   Skin: Positive for rash. Negative for itching.  Neurological: Negative for dizziness and headaches.  Hematological: Negative for adenopathy. Does not bruise/bleed easily.  Psychiatric/Behavioral: Negative for depression. The patient is not nervous/anxious.       PHYSICAL EXAMINATION  ECOG PERFORMANCE STATUS: 1 - Symptomatic but completely ambulatory  Vitals:   10/24/18 0848  BP: (!) 143/91  Pulse: 80  Resp: 18  Temp: 98.4 F (36.9 C)  SpO2: 99%    Physical Exam  Constitutional: She is oriented to person, place, and time. She appears well-developed and well-nourished.  HENT:  Head: Normocephalic and atraumatic.  Mouth/Throat: Oropharynx is clear and moist. No oropharyngeal exudate.  Eyes: Pupils are equal, round, and reactive to light. No scleral icterus.  Neck: Neck supple.  Cardiovascular: Normal rate, regular rhythm, normal heart sounds and intact distal pulses.  Pulmonary/Chest: Effort normal and breath sounds normal.  Abdominal: Soft. Bowel sounds are normal. She exhibits no distension and no mass. There is no tenderness. There is no guarding.  Musculoskeletal: She exhibits no edema.  Lymphadenopathy:    She has no cervical adenopathy.  Neurological: She is alert and oriented to person,  place, and time.  Skin: Skin is warm and dry. Capillary refill takes less than 2 seconds.  Psychiatric: She has a normal mood and affect.    LABORATORY DATA:  CBC    Component Value Date/Time   WBC 7.3 10/24/2018 0830   RBC 4.20 10/24/2018 0830   HGB 13.2 10/24/2018 0830   HCT 40.1 10/24/2018 0830   PLT 117 (L) 10/24/2018 0830   MCV 95.5 10/24/2018 0830   MCH 31.4 10/24/2018 0830   MCHC 32.9 10/24/2018 0830   RDW 17.4 (H) 10/24/2018 0830   LYMPHSABS 0.8 10/24/2018 0830   MONOABS 0.6 10/24/2018 0830   EOSABS 0.0 10/24/2018 0830   BASOSABS 0.1 10/24/2018 0830    CMP     Component Value Date/Time   NA 142 10/24/2018 0830   K 4.0 10/24/2018 0830   CL 109 10/24/2018 0830   CO2 26 10/24/2018 0830   GLUCOSE 96 10/24/2018 0830   BUN 13 10/24/2018 0830   CREATININE 0.76 10/24/2018 0830   CALCIUM 9.5 10/24/2018 0830   PROT 7.1 10/24/2018 0830   ALBUMIN 3.9 10/24/2018 0830   AST 17 10/24/2018 0830   ALT 21 10/24/2018 0830   ALKPHOS 108 10/24/2018 0830   BILITOT 0.3 10/24/2018 0830   GFRNONAA >60 10/24/2018 0830   GFRAA >60 10/24/2018 0830      ASSESSMENT and THERAPY PLAN:   Malignant neoplasm of upper-outer quadrant of right breast in female, estrogen receptor negative ([place]) 08/07/2018:Right lumpectomy: IDC grade 2, 1.5 cm, with intermediate grade DCIS, margins negative, 0/3 lymph nodes negative, triple negative with Ki-67 of 50%, T1 CN 0 stage IB Patient works at [employer] and can work from home if necessary.  Treatment plan: 1.Adjuvant chemotherapy with dose dense Adriamycin and Cytoxan x4 followed by Taxol weekly x12 2.Adjuvant radiation therapy  UPBEAT clinical trial ([id number]): No adverse effects being on the clinical trial _________________________________________________________________________________________ Current treatment: Cycle 4 day 1 dose dense Adriamycin and Cytoxan   Chemo toxicities: 1. Facial rash: sent in metrogel to apply daily,  also has triamcinolone 2. Thrombocytopenia: dose reduced further, above 100 today  Return to clinic in 2 weeks to begin weekly Taxol   All questions were answered. The patient knows to call the clinic with any problems, questions or concerns. We can certainly see the patient much sooner if necessary.  A total of (20) minutes of face-to-face time was spent with this patient  with greater than 50% of that time in counseling and care-coordination.  This note was electronically signed. Scot Dock, NP 10/24/2018

## 2018-10-24 NOTE — Patient Instructions (Signed)
[facility] Discharge Instructions for Patients Receiving Chemotherapy  Today you received the following chemotherapy agents doxorubicin (Adriamycin) and cyclophosphamide (Cytoxan).   To help prevent nausea and vomiting after your treatment, we encourage you to take your nausea medication as prescribed.    If you develop nausea and vomiting that is not controlled by your nausea medication, call the clinic.   BELOW ARE SYMPTOMS THAT SHOULD BE REPORTED IMMEDIATELY:  *FEVER GREATER THAN 100.5 F  *CHILLS WITH OR WITHOUT FEVER  NAUSEA AND VOMITING THAT IS NOT CONTROLLED WITH YOUR NAUSEA MEDICATION  *UNUSUAL SHORTNESS OF BREATH  *UNUSUAL BRUISING OR BLEEDING  TENDERNESS IN MOUTH AND THROAT WITH OR WITHOUT PRESENCE OF ULCERS  *URINARY PROBLEMS  *BOWEL PROBLEMS  UNUSUAL RASH Items with * indicate a potential emergency and should be followed up as soon as possible.  Feel free to call the clinic should you have any questions or concerns. The clinic phone number is [phone].  Please show the CHEMO ALERT CARD at check-in to the Emergency Department and triage nurse. 

## 2018-10-24 NOTE — Telephone Encounter (Signed)
No 11/13 los or referrals.

## 2018-10-24 NOTE — Assessment & Plan Note (Addendum)
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  Treatment plan: 1.Adjuvant chemotherapy with dose dense Adriamycin and Cytoxan x4 followed by Taxol weekly x12 2.Adjuvant radiation therapy  UPBEAT clinical trial ([id number]): No adverse effects being on the clinical trial _________________________________________________________________________________________ Current treatment: Cycle 4 day 1 dose dense Adriamycin and Cytoxan   Chemo toxicities: 1. Facial rash: sent in metrogel to apply daily, also has triamcinolone 2. Thrombocytopenia: dose reduced further, above 100 today  Return to clinic in 2 weeks to begin weekly Taxol

## 2018-10-25 ENCOUNTER — Encounter: Payer: Self-pay | Admitting: Genetics

## 2018-11-05 ENCOUNTER — Other Ambulatory Visit: Payer: Self-pay | Admitting: Hematology and Oncology

## 2018-11-06 ENCOUNTER — Other Ambulatory Visit: Payer: Self-pay | Admitting: Hematology and Oncology

## 2018-11-06 DIAGNOSIS — C50411 Malignant neoplasm of upper-outer quadrant of right female breast: Secondary | ICD-10-CM

## 2018-11-06 DIAGNOSIS — Z171 Estrogen receptor negative status [ER-]: Principal | ICD-10-CM

## 2018-11-07 ENCOUNTER — Inpatient Hospital Stay: Payer: BLUE CROSS/BLUE SHIELD

## 2018-11-07 ENCOUNTER — Encounter: Payer: Self-pay | Admitting: Adult Health

## 2018-11-07 ENCOUNTER — Telehealth: Payer: Self-pay | Admitting: Adult Health

## 2018-11-07 ENCOUNTER — Inpatient Hospital Stay (HOSPITAL_BASED_OUTPATIENT_CLINIC_OR_DEPARTMENT_OTHER): Payer: BLUE CROSS/BLUE SHIELD | Admitting: Adult Health

## 2018-11-07 VITALS — BP 134/87 | HR 83 | Temp 97.6°F | Resp 18 | Ht 66.5 in | Wt 232.4 lb

## 2018-11-07 VITALS — BP 123/82 | HR 79 | Temp 98.5°F | Resp 18

## 2018-11-07 DIAGNOSIS — N920 Excessive and frequent menstruation with regular cycle: Secondary | ICD-10-CM

## 2018-11-07 DIAGNOSIS — D696 Thrombocytopenia, unspecified: Secondary | ICD-10-CM

## 2018-11-07 DIAGNOSIS — Z171 Estrogen receptor negative status [ER-]: Secondary | ICD-10-CM | POA: Diagnosis not present

## 2018-11-07 DIAGNOSIS — Z95828 Presence of other vascular implants and grafts: Secondary | ICD-10-CM

## 2018-11-07 DIAGNOSIS — E785 Hyperlipidemia, unspecified: Secondary | ICD-10-CM

## 2018-11-07 DIAGNOSIS — C50411 Malignant neoplasm of upper-outer quadrant of right female breast: Secondary | ICD-10-CM

## 2018-11-07 DIAGNOSIS — Z79818 Long term (current) use of other agents affecting estrogen receptors and estrogen levels: Secondary | ICD-10-CM

## 2018-11-07 DIAGNOSIS — Z800 Family history of malignant neoplasm of digestive organs: Secondary | ICD-10-CM

## 2018-11-07 DIAGNOSIS — R21 Rash and other nonspecific skin eruption: Secondary | ICD-10-CM | POA: Diagnosis not present

## 2018-11-07 DIAGNOSIS — Z5111 Encounter for antineoplastic chemotherapy: Secondary | ICD-10-CM | POA: Diagnosis not present

## 2018-11-07 DIAGNOSIS — K5900 Constipation, unspecified: Secondary | ICD-10-CM

## 2018-11-07 DIAGNOSIS — R5383 Other fatigue: Secondary | ICD-10-CM

## 2018-11-07 DIAGNOSIS — I10 Essential (primary) hypertension: Secondary | ICD-10-CM | POA: Diagnosis not present

## 2018-11-07 LAB — CBC WITH DIFFERENTIAL (CANCER CENTER ONLY)
Abs Immature Granulocytes: 0.31 10*3/uL — ABNORMAL HIGH (ref 0.00–0.07)
BASOS PCT: 1 %
Basophils Absolute: 0 10*3/uL (ref 0.0–0.1)
EOS ABS: 0 10*3/uL (ref 0.0–0.5)
EOS PCT: 0 %
HEMATOCRIT: 35.2 % — AB (ref 36.0–46.0)
Hemoglobin: 11.6 g/dL — ABNORMAL LOW (ref 12.0–15.0)
Immature Granulocytes: 5 %
Lymphocytes Relative: 14 %
Lymphs Abs: 0.9 10*3/uL (ref 0.7–4.0)
MCH: 32 pg (ref 26.0–34.0)
MCHC: 33 g/dL (ref 30.0–36.0)
MCV: 97.2 fL (ref 80.0–100.0)
Monocytes Absolute: 0.6 10*3/uL (ref 0.1–1.0)
Monocytes Relative: 9 %
NRBC: 0 % (ref 0.0–0.2)
Neutro Abs: 4.7 10*3/uL (ref 1.7–7.7)
Neutrophils Relative %: 71 %
Platelet Count: 112 10*3/uL — ABNORMAL LOW (ref 150–400)
RBC: 3.62 MIL/uL — AB (ref 3.87–5.11)
RDW: 17.8 % — AB (ref 11.5–15.5)
WBC: 6.5 10*3/uL (ref 4.0–10.5)

## 2018-11-07 LAB — CMP (CANCER CENTER ONLY)
ALK PHOS: 102 U/L (ref 38–126)
ALT: 20 U/L (ref 0–44)
AST: 17 U/L (ref 15–41)
Albumin: 3.6 g/dL (ref 3.5–5.0)
Anion gap: 11 (ref 5–15)
BUN: 16 mg/dL (ref 6–20)
CALCIUM: 9 mg/dL (ref 8.9–10.3)
CO2: 23 mmol/L (ref 22–32)
CREATININE: 0.82 mg/dL (ref 0.44–1.00)
Chloride: 110 mmol/L (ref 98–111)
Glucose, Bld: 115 mg/dL — ABNORMAL HIGH (ref 70–99)
Potassium: 3.8 mmol/L (ref 3.5–5.1)
Sodium: 144 mmol/L (ref 135–145)
TOTAL PROTEIN: 6.5 g/dL (ref 6.5–8.1)
Total Bilirubin: <0.2 mg/dL — ABNORMAL LOW (ref 0.3–1.2)

## 2018-11-07 MED ORDER — SODIUM CHLORIDE 0.9% FLUSH
10.0000 mL | INTRAVENOUS | Status: DC | PRN
  Administered 2018-11-07: 10 mL
  Filled 2018-11-07: qty 10

## 2018-11-07 MED ORDER — DIPHENHYDRAMINE HCL 50 MG/ML IJ SOLN
INTRAMUSCULAR | Status: AC
  Filled 2018-11-07: qty 1

## 2018-11-07 MED ORDER — HEPARIN SOD (PORK) LOCK FLUSH 100 UNIT/ML IV SOLN
500.0000 [IU] | Freq: Once | INTRAVENOUS | Status: AC | PRN
  Administered 2018-11-07: 500 [IU]
  Filled 2018-11-07: qty 5

## 2018-11-07 MED ORDER — FAMOTIDINE IN NACL 20-0.9 MG/50ML-% IV SOLN
INTRAVENOUS | Status: AC
  Filled 2018-11-07: qty 50

## 2018-11-07 MED ORDER — SODIUM CHLORIDE 0.9 % IV SOLN
Freq: Once | INTRAVENOUS | Status: AC
  Administered 2018-11-07: 10:00:00 via INTRAVENOUS
  Filled 2018-11-07: qty 250

## 2018-11-07 MED ORDER — SODIUM CHLORIDE 0.9 % IV SOLN
10.0000 mg | Freq: Once | INTRAVENOUS | Status: DC
  Filled 2018-11-07: qty 1

## 2018-11-07 MED ORDER — SODIUM CHLORIDE 0.9 % IV SOLN
80.0000 mg/m2 | Freq: Once | INTRAVENOUS | Status: AC
  Administered 2018-11-07: 180 mg via INTRAVENOUS
  Filled 2018-11-07: qty 30

## 2018-11-07 MED ORDER — DEXAMETHASONE SODIUM PHOSPHATE 10 MG/ML IJ SOLN
10.0000 mg | Freq: Once | INTRAMUSCULAR | Status: AC
  Administered 2018-11-07: 10 mg via INTRAVENOUS

## 2018-11-07 MED ORDER — FAMOTIDINE IN NACL 20-0.9 MG/50ML-% IV SOLN
20.0000 mg | Freq: Once | INTRAVENOUS | Status: AC
  Administered 2018-11-07: 20 mg via INTRAVENOUS

## 2018-11-07 MED ORDER — DEXAMETHASONE SODIUM PHOSPHATE 10 MG/ML IJ SOLN
INTRAMUSCULAR | Status: AC
  Filled 2018-11-07: qty 1

## 2018-11-07 MED ORDER — DIPHENHYDRAMINE HCL 50 MG/ML IJ SOLN
25.0000 mg | Freq: Once | INTRAMUSCULAR | Status: AC
  Administered 2018-11-07: 25 mg via INTRAVENOUS

## 2018-11-07 NOTE — Patient Instructions (Signed)
[facility] Discharge Instructions for Patients Receiving Chemotherapy  Today you received the following chemotherapy agents taxol  To help prevent nausea and vomiting after your treatment, we encourage you to take your nausea medication as directed   If you develop nausea and vomiting that is not controlled by your nausea medication, call the clinic.   BELOW ARE SYMPTOMS THAT SHOULD BE REPORTED IMMEDIATELY:  *FEVER GREATER THAN 100.5 F  *CHILLS WITH OR WITHOUT FEVER  NAUSEA AND VOMITING THAT IS NOT CONTROLLED WITH YOUR NAUSEA MEDICATION  *UNUSUAL SHORTNESS OF BREATH  *UNUSUAL BRUISING OR BLEEDING  TENDERNESS IN MOUTH AND THROAT WITH OR WITHOUT PRESENCE OF ULCERS  *URINARY PROBLEMS  *BOWEL PROBLEMS  UNUSUAL RASH Items with * indicate a potential emergency and should be followed up as soon as possible.  Feel free to call the clinic you have any questions or concerns. The clinic phone number is [phone].  

## 2018-11-07 NOTE — Progress Notes (Signed)
[facility] Follow up:    Jennifer Neer, MD [address] [place] [address]   DIAGNOSIS: Cancer Staging Malignant neoplasm of upper-outer quadrant of right breast in female, estrogen receptor negative ([place]) Staging form: Breast, AJCC 8th Edition - Clinical: Stage IB (cT1c, cN0, cM0, G2, ER-, PR-, HER2-) - Unsigned - Pathologic stage from 08/07/2018: Stage IB (pT1c, pN0, cM0, G2, ER-, PR-, HER2-) - Signed by Gardenia Phlegm, NP on 08/22/2018   SUMMARY OF ONCOLOGIC HISTORY:   Malignant neoplasm of upper-outer quadrant of right breast in female, estrogen receptor negative ([place])   07/13/2018 Initial Diagnosis    Screening detected right breast asymmetry UOQ 10 o'clock position 1.4 cm, axilla negative, biopsy revealed grade 2 IDC triple negative with a Ki-67 of 50% T1c N0 stage Ib AJCC 8    08/06/2018 Genetic Testing    NBN c.2117C>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  The Hereditary Gene Panel offered by [facility] includes sequencing and/or deletion duplication testing of the following 47 genes: APC, ATM, AXIN2, BARD1, BMPR1A, BRCA1, BRCA2, BRIP1, CDH1, CDK4, CDKN2A (p14ARF), CDKN2A (p16INK4a), CHEK2, CTNNA1, DICER1, EPCAM (Deletion/duplication testing only), GREM1 (promoter region deletion/duplication testing only), KIT, MEN1, MLH1, MSH2, MSH3, MSH6, MUTYH, NBN, NF1, NHTL1, PALB2, PDGFRA, PMS2, POLD1, POLE, PTEN, RAD50, RAD51C, RAD51D, SDHB, SDHC, SDHD, SMAD4, SMARCA4. STK11, TP53, TSC1, TSC2, and VHL.  The following genes were evaluated for sequence changes only: SDHA and HOXB13 c.251G>A variant only. The report date is August 06, 2018.    08/07/2018 Surgery    Right lumpectomy: IDC grade 2, 1.5 cm, with intermediate grade DCIS, margins negative, 0/3 lymph nodes negative, triple negative with Ki-67 of 50%, T1 CN 0 stage IB    08/07/2018 Cancer Staging    Staging form:  Breast, AJCC 8th Edition - Pathologic stage from 08/07/2018: Stage IB (pT1c, pN0, cM0, G2, ER-, PR-, HER2-) - Signed by Gardenia Phlegm, NP on 08/22/2018    09/05/2018 -  Chemotherapy    Adriamycin and Cytoxan x 4 to be followed by weekly Taxol    11/06/2018 -  Chemotherapy    The patient had PACLitaxel (TAXOL) 180 mg in sodium chloride 0.9 % 250 mL chemo infusion (</= 65m/m2), 80 mg/m2 = 180 mg, Intravenous,  Once, 0 of 12 cycles  for chemotherapy treatment.      CURRENT THERAPY: Taxol  INTERVAL HISTORY: Jennifer Carroll 52y.o. female returns for evaluation accompanied by her husband prior to starting adjuvant Taxol today.  She is doing well today.  She had some constipation after her fourth cycle of AC.  She is doing better.  She wants to know how to take her nausea medication.  She notes the rash on her face is improved.     Patient Active Problem List   Diagnosis Date Noted  . Port-A-Cath in place 09/12/2018  . 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 Genetic testing 08/06/2018  . Family history of pancreatic cancer   . Malignant neoplasm of upper-outer quadrant of right breast in female, estrogen receptor negative ([place] 07/18/2018  . Irregular menses 05/08/2012    has No Known Allergies.  MEDICAL HISTORY: Past Medical History:  Diagnosis Date  . AMA (advanced maternal age) multigravida 35+ 03/28/05   with no amniocentesis  . 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 Cancer ([facility]    Right breast   . Family history of pancreatic cancer   .  H/O infertility   . H/O menorrhagia   . History of chicken pox   . History of measles, mumps, or rubella   . Hyperlipidemia   . Hypertension   . Low iron   . Menses, irregular   . 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   Jennifer Carroll     SURGICAL HISTORY: Past  Surgical History:  Procedure Laterality Date  . BREAST LUMPECTOMY WITH RADIOACTIVE SEED AND SENTINEL LYMPH NODE BIOPSY Right 08/07/2018   Procedure: INJECT BLUE DYE RIGHT BREAST,RIGHT BREAST LUMPECTOMY WITH RADIOACTIVE SEED AND RIGHT AXILLARY DEEP SENTINEL LYMPH NODE BIOPSY;  Surgeon: Fanny Skates, MD;  Location: [facility];  Service: General;  Laterality: Right;  . EYE SURGERY     left eye at age 70  . head surgery     to cover hole in head at age of  51 yrs young  . PORTACATH PLACEMENT Left 08/07/2018   Procedure: INSERTION PORT-A-CATH;  Surgeon: Fanny Skates, MD;  Location: [place];  Service: General;  Laterality: Left;  . WISDOM TOOTH EXTRACTION  2001    SOCIAL HISTORY: Social History   Socioeconomic History  . Marital status: Married    Spouse name: Not on file  . Number of children: Not on file  . Years of education: Not on file  . Highest education level: Not on file  Occupational History  . Not on file  Social Needs  . Financial resource strain: Not on file  . Food insecurity:    Worry: Not on file    Inability: Not on file  . Transportation needs:    Medical: Not on file    Non-medical: Not on file  Tobacco Use  . Smoking status: Never Smoker  . Smokeless tobacco: Never Used  Substance and Sexual Activity  . Alcohol use: Not Currently  . Drug use: Never  . Sexual activity: Yes  Lifestyle  . Physical activity:    Days per week: Not on file    Minutes per session: Not on file  . Stress: Not on file  Relationships  . Social connections:    Talks on phone: Not on file    Gets together: Not on file    Attends religious service: Not on file    Active member of club or organization: Not on file    Attends meetings of clubs or organizations: Not on file    Relationship status: Not on file  . Intimate partner violence:    Fear of current or ex partner: Not on file    Emotionally abused: Not on file    Physically abused: Not on  file    Forced sexual activity: Not on file  Other Topics Concern  . Not on file  Social History Narrative  . Not on file    FAMILY HISTORY: Family History  Problem Relation Age of Onset  . Hypertension Mother   . Kidney disease Mother   . Heart disease Father   . Hypertension Brother   . Kidney failure Maternal Grandfather   . Pancreatic cancer Cousin        dx >50    Review of Systems  Constitutional: Negative for appetite change, chills, fatigue, fever and unexpected weight change.  HENT:   Negative for hearing loss, lump/mass and trouble swallowing.   Eyes: Negative for eye problems and icterus.  Respiratory: Negative for chest tightness, cough and shortness of breath.   Cardiovascular: Negative for chest pain and leg swelling.  Gastrointestinal: Positive for constipation. Negative for abdominal distention, abdominal pain, blood in stool, diarrhea and vomiting.  Endocrine: Negative for hot flashes.  Skin: Positive for rash (see interval history). Negative for itching.  Neurological: Negative for dizziness, extremity weakness, headaches and numbness.  Hematological: Negative for adenopathy. Does not bruise/bleed easily.  Psychiatric/Behavioral: Negative for depression. The patient is not nervous/anxious.       PHYSICAL EXAMINATION  ECOG PERFORMANCE STATUS: 1 - Symptomatic but completely ambulatory  Vitals:   11/07/18 0825  BP: 134/87  Pulse: 83  Resp: 18  Temp: (!) 83 F (28.3 C)  SpO2: 99%    Physical Exam  Constitutional: She appears well-developed.  HENT:  Head: Normocephalic and atraumatic.  Mouth/Throat: Oropharynx is clear and moist. No oropharyngeal exudate.  Eyes: Pupils are equal, round, and reactive to light. No scleral icterus.  Neck: Neck supple.  Cardiovascular: Normal rate, regular rhythm and normal heart sounds.  Pulmonary/Chest: Effort normal and breath sounds normal.  Abdominal: Soft. Bowel sounds are normal. She exhibits no distension and  no mass. There is no tenderness. There is no guarding.  Musculoskeletal: She exhibits no edema.  Lymphadenopathy:    She has no cervical adenopathy.  Neurological: She is alert.  Skin: Skin is warm and dry. Capillary refill takes less than 2 seconds.  Psychiatric: She has a normal mood and affect.    LABORATORY DATA:  CBC    Component Value Date/Time   WBC 6.5 11/07/2018 0802   RBC 3.62 (L) 11/07/2018 0802   HGB 11.6 (L) 11/07/2018 0802   HCT 35.2 (L) 11/07/2018 0802   PLT 112 (L) 11/07/2018 0802   MCV 97.2 11/07/2018 0802   MCH 32.0 11/07/2018 0802   MCHC 33.0 11/07/2018 0802   RDW 17.8 (H) 11/07/2018 0802   LYMPHSABS 0.9 11/07/2018 0802   MONOABS 0.6 11/07/2018 0802   EOSABS 0.0 11/07/2018 0802   BASOSABS 0.0 11/07/2018 0802    CMP     Component Value Date/Time   NA 142 10/24/2018 0830   K 4.0 10/24/2018 0830   CL 109 10/24/2018 0830   CO2 26 10/24/2018 0830   GLUCOSE 96 10/24/2018 0830   BUN 13 10/24/2018 0830   CREATININE 0.76 10/24/2018 0830   CALCIUM 9.5 10/24/2018 0830   PROT 7.1 10/24/2018 0830   ALBUMIN 3.9 10/24/2018 0830   AST 17 10/24/2018 0830   ALT 21 10/24/2018 0830   ALKPHOS 108 10/24/2018 0830   BILITOT 0.3 10/24/2018 0830   GFRNONAA >60 10/24/2018 0830   GFRAA >60 10/24/2018 0830            ASSESSMENT and THERAPY PLAN:   Malignant neoplasm of upper-outer quadrant of right breast in female, estrogen receptor negative ([place]) 08/07/2018:Right lumpectomy: IDC grade 2, 1.5 cm, with intermediate grade DCIS, margins negative, 0/3 lymph nodes negative, triple negative with Ki-67 of 50%, T1 CN 0 stage IB Patient works at [employer] and can work from home if necessary.  Treatment plan: 1.Adjuvant chemotherapy with dose dense Adriamycin and Cytoxan x4 followed by Taxol weekly x12 2.Adjuvant radiation therapy  UPBEAT clinical trial ([id number]): No adverse effects being on the clinical  trial _________________________________________________________________________________________ Current treatment: Taxol weekly   Chemo toxicities: 1. Facial rash: sent in metrogel to apply daily, also has triamcinolone 2. Thrombocytopenia: weekly CBC, plts stable 3. Constipation: recommended stool softeners and Miralax  Reviewed  Taxol in detail including how to take anti nausea medications.  Reviewed the more common potential side effects, including bone pain, lacrimal duct stenosis, rash, hypersensitivity, and peripheral neuropathy.  She is prepared to ice her fingers and toes today.  Recommended that she take the Lorazepam she has at home tonight to help with sleep.  Reviewed anti emetics with her and how to take these in detail.    Return to clinic in one week for labs, f/u, and her next Taxol.    All questions were answered. The patient knows to call the clinic with any problems, questions or concerns. We can certainly see the patient much sooner if necessary.  A total of (20) minutes of face-to-face time was spent with this patient with greater than 50% of that time in counseling and care-coordination.  This note was electronically signed. Scot Dock, NP 11/07/2018

## 2018-11-07 NOTE — Telephone Encounter (Signed)
No 11/27 los.

## 2018-11-07 NOTE — Assessment & Plan Note (Addendum)
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  Treatment plan: 1.Adjuvant chemotherapy with dose dense Adriamycin and Cytoxan x4 followed by Taxol weekly x12 2.Adjuvant radiation therapy  UPBEAT clinical trial ([id number]): No adverse effects being on the clinical trial _________________________________________________________________________________________ Current treatment: Taxol weekly   Chemo toxicities: 1. Facial rash: sent in metrogel to apply daily, also has triamcinolone 2. Thrombocytopenia: weekly CBC, plts stable 3. Constipation: recommended stool softeners and Miralax  Reviewed Taxol in detail including how to take anti nausea medications.  Reviewed the more common potential side effects, including bone pain, lacrimal duct stenosis, rash, hypersensitivity, and peripheral neuropathy.  She is prepared to ice her fingers and toes today.  Recommended that she take the Lorazepam she has at home tonight to help with sleep.  Reviewed anti emetics with her and how to take these in detail.    Return to clinic in one week for labs, f/u, and her next Taxol.

## 2018-11-09 ENCOUNTER — Telehealth: Payer: Self-pay | Admitting: Genetics

## 2018-11-09 NOTE — Telephone Encounter (Signed)
Patient's cousin, Jennifer Carroll is coming for genetic testing.   [name] gave verbal permission to discuss her genetic test result and any information contained during her genetic counseling session with her cousin Jennifer Carroll.

## 2018-11-14 ENCOUNTER — Encounter: Payer: Self-pay | Admitting: Adult Health

## 2018-11-14 ENCOUNTER — Inpatient Hospital Stay (HOSPITAL_BASED_OUTPATIENT_CLINIC_OR_DEPARTMENT_OTHER): Payer: BLUE CROSS/BLUE SHIELD | Admitting: Adult Health

## 2018-11-14 ENCOUNTER — Inpatient Hospital Stay: Payer: BLUE CROSS/BLUE SHIELD

## 2018-11-14 ENCOUNTER — Inpatient Hospital Stay: Payer: BLUE CROSS/BLUE SHIELD | Attending: Hematology and Oncology

## 2018-11-14 VITALS — BP 148/90 | HR 90 | Temp 97.4°F | Resp 17 | Ht 66.5 in | Wt 233.9 lb

## 2018-11-14 DIAGNOSIS — C50411 Malignant neoplasm of upper-outer quadrant of right female breast: Secondary | ICD-10-CM

## 2018-11-14 DIAGNOSIS — Z171 Estrogen receptor negative status [ER-]: Secondary | ICD-10-CM

## 2018-11-14 DIAGNOSIS — D696 Thrombocytopenia, unspecified: Secondary | ICD-10-CM | POA: Diagnosis not present

## 2018-11-14 DIAGNOSIS — Z800 Family history of malignant neoplasm of digestive organs: Secondary | ICD-10-CM | POA: Diagnosis not present

## 2018-11-14 DIAGNOSIS — R21 Rash and other nonspecific skin eruption: Secondary | ICD-10-CM

## 2018-11-14 DIAGNOSIS — K5900 Constipation, unspecified: Secondary | ICD-10-CM | POA: Diagnosis not present

## 2018-11-14 DIAGNOSIS — I10 Essential (primary) hypertension: Secondary | ICD-10-CM | POA: Insufficient documentation

## 2018-11-14 DIAGNOSIS — E785 Hyperlipidemia, unspecified: Secondary | ICD-10-CM | POA: Insufficient documentation

## 2018-11-14 DIAGNOSIS — Z5111 Encounter for antineoplastic chemotherapy: Secondary | ICD-10-CM | POA: Diagnosis not present

## 2018-11-14 DIAGNOSIS — Z95828 Presence of other vascular implants and grafts: Secondary | ICD-10-CM

## 2018-11-14 LAB — CMP (CANCER CENTER ONLY)
ALK PHOS: 73 U/L (ref 38–126)
ALT: 29 U/L (ref 0–44)
ANION GAP: 8 (ref 5–15)
AST: 23 U/L (ref 15–41)
Albumin: 3.4 g/dL — ABNORMAL LOW (ref 3.5–5.0)
BUN: 12 mg/dL (ref 6–20)
CALCIUM: 8.9 mg/dL (ref 8.9–10.3)
CO2: 24 mmol/L (ref 22–32)
CREATININE: 0.78 mg/dL (ref 0.44–1.00)
Chloride: 111 mmol/L (ref 98–111)
Glucose, Bld: 120 mg/dL — ABNORMAL HIGH (ref 70–99)
Potassium: 4 mmol/L (ref 3.5–5.1)
SODIUM: 143 mmol/L (ref 135–145)
TOTAL PROTEIN: 6.3 g/dL — AB (ref 6.5–8.1)

## 2018-11-14 LAB — CBC WITH DIFFERENTIAL (CANCER CENTER ONLY)
Abs Immature Granulocytes: 0.02 10*3/uL (ref 0.00–0.07)
BASOS ABS: 0 10*3/uL (ref 0.0–0.1)
BASOS PCT: 1 %
EOS ABS: 0 10*3/uL (ref 0.0–0.5)
EOS PCT: 1 %
HEMATOCRIT: 33.3 % — AB (ref 36.0–46.0)
Hemoglobin: 11 g/dL — ABNORMAL LOW (ref 12.0–15.0)
IMMATURE GRANULOCYTES: 1 %
Lymphocytes Relative: 21 %
Lymphs Abs: 0.7 10*3/uL (ref 0.7–4.0)
MCH: 32.4 pg (ref 26.0–34.0)
MCHC: 33 g/dL (ref 30.0–36.0)
MCV: 98.2 fL (ref 80.0–100.0)
Monocytes Absolute: 0.4 10*3/uL (ref 0.1–1.0)
Monocytes Relative: 12 %
NEUTROS PCT: 64 %
NRBC: 0 % (ref 0.0–0.2)
Neutro Abs: 2.3 10*3/uL (ref 1.7–7.7)
PLATELETS: 198 10*3/uL (ref 150–400)
RBC: 3.39 MIL/uL — ABNORMAL LOW (ref 3.87–5.11)
RDW: 17.2 % — AB (ref 11.5–15.5)
WBC: 3.5 10*3/uL — AB (ref 4.0–10.5)

## 2018-11-14 MED ORDER — DIPHENHYDRAMINE HCL 50 MG/ML IJ SOLN
INTRAMUSCULAR | Status: AC
  Filled 2018-11-14: qty 1

## 2018-11-14 MED ORDER — FAMOTIDINE IN NACL 20-0.9 MG/50ML-% IV SOLN
INTRAVENOUS | Status: AC
  Filled 2018-11-14: qty 50

## 2018-11-14 MED ORDER — DEXAMETHASONE SODIUM PHOSPHATE 10 MG/ML IJ SOLN
INTRAMUSCULAR | Status: AC
  Filled 2018-11-14: qty 1

## 2018-11-14 MED ORDER — DEXAMETHASONE SODIUM PHOSPHATE 10 MG/ML IJ SOLN
10.0000 mg | Freq: Once | INTRAMUSCULAR | Status: AC
  Administered 2018-11-14: 10 mg via INTRAVENOUS

## 2018-11-14 MED ORDER — FAMOTIDINE IN NACL 20-0.9 MG/50ML-% IV SOLN
20.0000 mg | Freq: Once | INTRAVENOUS | Status: AC
  Administered 2018-11-14: 20 mg via INTRAVENOUS

## 2018-11-14 MED ORDER — HEPARIN SOD (PORK) LOCK FLUSH 100 UNIT/ML IV SOLN
500.0000 [IU] | Freq: Once | INTRAVENOUS | Status: AC | PRN
  Administered 2018-11-14: 500 [IU]
  Filled 2018-11-14: qty 5

## 2018-11-14 MED ORDER — SODIUM CHLORIDE 0.9% FLUSH
10.0000 mL | INTRAVENOUS | Status: DC | PRN
  Administered 2018-11-14: 10 mL
  Filled 2018-11-14: qty 10

## 2018-11-14 MED ORDER — SODIUM CHLORIDE 0.9 % IV SOLN
80.0000 mg/m2 | Freq: Once | INTRAVENOUS | Status: AC
  Administered 2018-11-14: 180 mg via INTRAVENOUS
  Filled 2018-11-14: qty 30

## 2018-11-14 MED ORDER — DIPHENHYDRAMINE HCL 50 MG/ML IJ SOLN
25.0000 mg | Freq: Once | INTRAMUSCULAR | Status: AC
  Administered 2018-11-14: 25 mg via INTRAVENOUS

## 2018-11-14 MED ORDER — SODIUM CHLORIDE 0.9 % IV SOLN
Freq: Once | INTRAVENOUS | Status: AC
  Administered 2018-11-14: 12:00:00 via INTRAVENOUS
  Filled 2018-11-14: qty 250

## 2018-11-14 NOTE — Patient Instructions (Signed)
[facility] Discharge Instructions for Patients Receiving Chemotherapy  Today you received the following chemotherapy agents taxol  To help prevent nausea and vomiting after your treatment, we encourage you to take your nausea medication as directed   If you develop nausea and vomiting that is not controlled by your nausea medication, call the clinic.   BELOW ARE SYMPTOMS THAT SHOULD BE REPORTED IMMEDIATELY:  *FEVER GREATER THAN 100.5 F  *CHILLS WITH OR WITHOUT FEVER  NAUSEA AND VOMITING THAT IS NOT CONTROLLED WITH YOUR NAUSEA MEDICATION  *UNUSUAL SHORTNESS OF BREATH  *UNUSUAL BRUISING OR BLEEDING  TENDERNESS IN MOUTH AND THROAT WITH OR WITHOUT PRESENCE OF ULCERS  *URINARY PROBLEMS  *BOWEL PROBLEMS  UNUSUAL RASH Items with * indicate a potential emergency and should be followed up as soon as possible.  Feel free to call the clinic you have any questions or concerns. The clinic phone number is [phone].  

## 2018-11-14 NOTE — Assessment & Plan Note (Addendum)
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  Treatment plan: 1.Adjuvant chemotherapy with dose dense Adriamycin and Cytoxan x4 followed by Taxol weekly x12 2.Adjuvant radiation therapy  UPBEAT clinical trial ([id number]): No adverse effects being on the clinical trial _________________________________________________________________________________________ Current treatment: Taxol weekly   Chemo toxicities: 1. Facial rash: sent in metrogel to apply daily, also has triamcinolone 2. Thrombocytopenia: weekly CBC, plts improved today 3. Constipation: recommended stool softeners and Miralax, resolved at this point   Return to clinic in one week for labs, f/u, and her next Taxol.

## 2018-11-14 NOTE — Progress Notes (Signed)
[facility] Follow up:    Jennifer Neer, MD [address] [place] [address]   DIAGNOSIS: Cancer Staging Malignant neoplasm of upper-outer quadrant of right breast in female, estrogen receptor negative ([place]) Staging form: Breast, AJCC 8th Edition - Clinical: Stage IB (cT1c, cN0, cM0, G2, ER-, PR-, HER2-) - Unsigned - Pathologic stage from 08/07/2018: Stage IB (pT1c, pN0, cM0, G2, ER-, PR-, HER2-) - Signed by Gardenia Phlegm, NP on 08/22/2018   SUMMARY OF ONCOLOGIC HISTORY:   Malignant neoplasm of upper-outer quadrant of right breast in female, estrogen receptor negative ([place])   07/13/2018 Initial Diagnosis    Screening detected right breast asymmetry UOQ 10 o'clock position 1.4 cm, axilla negative, biopsy revealed grade 2 IDC triple negative with a Ki-67 of 50% T1c N0 stage Ib AJCC 8    08/06/2018 Genetic Testing    NBN c.2117C>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  The Hereditary Gene Panel offered by [facility] includes sequencing and/or deletion duplication testing of the following 47 genes: APC, ATM, AXIN2, BARD1, BMPR1A, BRCA1, BRCA2, BRIP1, CDH1, CDK4, CDKN2A (p14ARF), CDKN2A (p16INK4a), CHEK2, CTNNA1, DICER1, EPCAM (Deletion/duplication testing only), GREM1 (promoter region deletion/duplication testing only), KIT, MEN1, MLH1, MSH2, MSH3, MSH6, MUTYH, NBN, NF1, NHTL1, PALB2, PDGFRA, PMS2, POLD1, POLE, PTEN, RAD50, RAD51C, RAD51D, SDHB, SDHC, SDHD, SMAD4, SMARCA4. STK11, TP53, TSC1, TSC2, and VHL.  The following genes were evaluated for sequence changes only: SDHA and HOXB13 c.251G>A variant only. The report date is August 06, 2018.    08/07/2018 Surgery    Right lumpectomy: IDC grade 2, 1.5 cm, with intermediate grade DCIS, margins negative, 0/3 lymph nodes negative, triple negative with Ki-67 of 50%, T1 CN 0 stage IB    08/07/2018 Cancer Staging    Staging form:  Breast, AJCC 8th Edition - Pathologic stage from 08/07/2018: Stage IB (pT1c, pN0, cM0, G2, ER-, PR-, HER2-) - Signed by Gardenia Phlegm, NP on 08/22/2018    09/05/2018 -  Chemotherapy    Adriamycin and Cytoxan x 4 to be followed by weekly Taxol    11/06/2018 -  Chemotherapy    The patient had PACLitaxel (TAXOL) 180 mg in sodium chloride 0.9 % 250 mL chemo infusion (</= 18m/m2), 80 mg/m2 = 180 mg, Intravenous,  Once, 2 of 12 cycles Administration: 180 mg (11/07/2018)  for chemotherapy treatment.      CURRENT THERAPY: Week 2 Taxol  INTERVAL HISTORY: Jennifer Carroll 52y.o. female returns for evlauation prior to her weekly Taxol.  She started this last week and tolerated it well.  Her facial rash has continued.  She uses metrogel which helps when it flares up.  She denies peripheral neuropathy.  Her constipation is improved.  She has not had any nausea or vomiting.  She took Compazine the night after her treatment, and then did not need anything else.     Patient Active Problem List   Diagnosis Date Noted  . Port-A-Cath in place 09/12/2018  . 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 Genetic testing 08/06/2018  . Family history of pancreatic cancer   . Malignant neoplasm of upper-outer quadrant of right breast in female, estrogen receptor negative ([place] 07/18/2018  . Irregular menses 05/08/2012    has No Known Allergies.  MEDICAL HISTORY: Past Medical History:  Diagnosis Date  . AMA (advanced maternal age) multigravida 35+ 03/28/05   with no amniocentesis  . Anovulation 02/07/01  chronic   . Cancer [facility])    Right breast   . Family history of pancreatic cancer   . H/O infertility   . H/O menorrhagia   . History of chicken pox   . History of measles, mumps, or rubella   . Hyperlipidemia   . Hypertension   . Low iron   . Menses, irregular   . 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   [name]     SURGICAL HISTORY: Past Surgical History:  Procedure Laterality Date  . BREAST LUMPECTOMY WITH RADIOACTIVE SEED AND SENTINEL LYMPH NODE BIOPSY Right 08/07/2018   Procedure: INJECT BLUE DYE RIGHT BREAST,RIGHT BREAST LUMPECTOMY WITH RADIOACTIVE SEED AND RIGHT AXILLARY DEEP SENTINEL LYMPH NODE BIOPSY;  Surgeon: Fanny Skates, MD;  Location: [place];  Service: General;  Laterality: Right;  . EYE SURGERY     left eye at age 21  . head surgery     to cover hole in head at age of  63 yrs young  . PORTACATH PLACEMENT Left 08/07/2018   Procedure: INSERTION PORT-A-CATH;  Surgeon: Fanny Skates, MD;  Location: [place];  Service: General;  Laterality: Left;  . WISDOM TOOTH EXTRACTION  2001    SOCIAL HISTORY: Social History   Socioeconomic History  . Marital status: Married    Spouse name: Not on file  . Number of children: Not on file  . Years of education: Not on file  . Highest education level: Not on file  Occupational History  . Not on file  Social Needs  . Financial resource strain: Not on file  . Food insecurity:    Worry: Not on file    Inability: Not on file  . Transportation needs:    Medical: Not on file    Non-medical: Not on file  Tobacco Use  . Smoking status: Never Smoker  . Smokeless tobacco: Never Used  Substance and Sexual Activity  . Alcohol use: Not Currently  . Drug use: Never  . Sexual activity: Yes  Lifestyle  . Physical activity:    Days per week: Not on file    Minutes per session: Not on file  . Stress: Not on file  Relationships  . Social connections:    Talks on phone: Not on file    Gets together: Not on file    Attends religious service: Not on file    Active member of club or organization: Not on file    Attends meetings of clubs or organizations: Not on file    Relationship status: Not on file  . Intimate partner  violence:    Fear of current or ex partner: Not on file    Emotionally abused: Not on file    Physically abused: Not on file    Forced sexual activity: Not on file  Other Topics Concern  . Not on file  Social History Narrative  . Not on file    FAMILY HISTORY: Family History  Problem Relation Age of Onset  . Hypertension Mother   . Kidney disease Mother   . Heart disease Father   . Hypertension Brother   . Kidney failure Maternal Grandfather   . Pancreatic cancer Cousin        dx >50    Review of Systems  Constitutional: Negative for appetite change, chills, fatigue, fever and unexpected weight  change.  HENT:   Negative for hearing loss, lump/mass and sore throat.   Eyes: Negative for eye problems and icterus.  Respiratory: Negative for chest tightness, cough and shortness of breath.   Cardiovascular: Negative for chest pain, leg swelling and palpitations.  Gastrointestinal: Negative for abdominal distention, abdominal pain, constipation, diarrhea, nausea and vomiting.  Endocrine: Negative for hot flashes.  Musculoskeletal: Negative for arthralgias.  Skin: Positive for rash. Negative for itching.  Neurological: Negative for dizziness, extremity weakness, headaches and numbness.  Hematological: Negative for adenopathy. Does not bruise/bleed easily.  Psychiatric/Behavioral: Negative for depression. The patient is not nervous/anxious.       PHYSICAL EXAMINATION  ECOG PERFORMANCE STATUS: 1 - Symptomatic but completely ambulatory  Vitals:   11/14/18 1046  BP: (!) 148/90  Pulse: 90  Resp: 17  Temp: (!) 97.4 F (36.3 C)  SpO2: 99%    Physical Exam  Constitutional: She is oriented to person, place, and time. She appears well-developed and well-nourished.  HENT:  Head: Normocephalic and atraumatic.  Mouth/Throat: Oropharynx is clear and moist. No oropharyngeal exudate.  Eyes: Pupils are equal, round, and reactive to light. No scleral icterus.  Neck: Neck supple.   Cardiovascular: Normal rate, regular rhythm and normal heart sounds.  Pulmonary/Chest: Effort normal and breath sounds normal.  Abdominal: Soft. Bowel sounds are normal.  Musculoskeletal: She exhibits no edema.  Lymphadenopathy:    She has no cervical adenopathy.  Neurological: She is alert and oriented to person, place, and time.  Skin: Skin is warm and dry. Capillary refill takes less than 2 seconds. Rash (erythematous facial rash) noted.  Psychiatric: She has a normal mood and affect.    LABORATORY DATA:  CBC    Component Value Date/Time   WBC 3.5 (L) 11/14/2018 1022   RBC 3.39 (L) 11/14/2018 1022   HGB 11.0 (L) 11/14/2018 1022   HCT 33.3 (L) 11/14/2018 1022   PLT 198 11/14/2018 1022   MCV 98.2 11/14/2018 1022   MCH 32.4 11/14/2018 1022   MCHC 33.0 11/14/2018 1022   RDW 17.2 (H) 11/14/2018 1022   LYMPHSABS 0.7 11/14/2018 1022   MONOABS 0.4 11/14/2018 1022   EOSABS 0.0 11/14/2018 1022   BASOSABS 0.0 11/14/2018 1022    CMP     Component Value Date/Time   NA 143 11/14/2018 1022   K 4.0 11/14/2018 1022   CL 111 11/14/2018 1022   CO2 24 11/14/2018 1022   GLUCOSE 120 (H) 11/14/2018 1022   BUN 12 11/14/2018 1022   CREATININE 0.78 11/14/2018 1022   CALCIUM 8.9 11/14/2018 1022   PROT 6.3 (L) 11/14/2018 1022   ALBUMIN 3.4 (L) 11/14/2018 1022   AST 23 11/14/2018 1022   ALT 29 11/14/2018 1022   ALKPHOS 73 11/14/2018 1022   BILITOT <0.2 (L) 11/14/2018 1022   GFRNONAA >60 11/14/2018 1022   GFRAA >60 11/14/2018 1022        ASSESSMENT and THERAPY PLAN:   Malignant neoplasm of upper-outer quadrant of right breast in female, estrogen receptor negative ([place]) 08/07/2018:Right lumpectomy: IDC grade 2, 1.5 cm, with intermediate grade DCIS, margins negative, 0/3 lymph nodes negative, triple negative with Ki-67 of 50%, T1 CN 0 stage IB Patient works at [employer] and can work from home if necessary.  Treatment plan: 1.Adjuvant chemotherapy with dose dense  Adriamycin and Cytoxan x4 followed by Taxol weekly x12 2.Adjuvant radiation therapy  UPBEAT clinical trial ([id number]): No adverse effects being on the clinical trial _________________________________________________________________________________________ Current treatment: Taxol weekly  Chemo toxicities: 1. Facial rash: sent in metrogel to apply daily, also has triamcinolone 2. Thrombocytopenia: weekly CBC, plts improved today 3. Constipation: recommended stool softeners and Miralax, resolved at this point   Return to clinic in one week for labs, f/u, and her next Taxol.     All questions were answered. The patient knows to call the clinic with any problems, questions or concerns. We can certainly see the patient much sooner if necessary.  A total of (20) minutes of face-to-face time was spent with this patient with greater than 50% of that time in counseling and care-coordination.  This note was electronically signed. Scot Dock, NP 11/14/2018

## 2018-11-21 ENCOUNTER — Inpatient Hospital Stay: Payer: BLUE CROSS/BLUE SHIELD

## 2018-11-21 VITALS — BP 135/88 | HR 79 | Temp 98.0°F | Resp 18

## 2018-11-21 DIAGNOSIS — C50411 Malignant neoplasm of upper-outer quadrant of right female breast: Secondary | ICD-10-CM | POA: Diagnosis not present

## 2018-11-21 DIAGNOSIS — E785 Hyperlipidemia, unspecified: Secondary | ICD-10-CM | POA: Diagnosis not present

## 2018-11-21 DIAGNOSIS — Z171 Estrogen receptor negative status [ER-]: Principal | ICD-10-CM

## 2018-11-21 DIAGNOSIS — I10 Essential (primary) hypertension: Secondary | ICD-10-CM | POA: Diagnosis not present

## 2018-11-21 DIAGNOSIS — D696 Thrombocytopenia, unspecified: Secondary | ICD-10-CM | POA: Diagnosis not present

## 2018-11-21 DIAGNOSIS — R21 Rash and other nonspecific skin eruption: Secondary | ICD-10-CM | POA: Diagnosis not present

## 2018-11-21 DIAGNOSIS — Z5111 Encounter for antineoplastic chemotherapy: Secondary | ICD-10-CM | POA: Diagnosis not present

## 2018-11-21 DIAGNOSIS — K5900 Constipation, unspecified: Secondary | ICD-10-CM | POA: Diagnosis not present

## 2018-11-21 DIAGNOSIS — Z800 Family history of malignant neoplasm of digestive organs: Secondary | ICD-10-CM | POA: Diagnosis not present

## 2018-11-21 DIAGNOSIS — Z95828 Presence of other vascular implants and grafts: Secondary | ICD-10-CM

## 2018-11-21 LAB — CMP (CANCER CENTER ONLY)
ALT: 32 U/L (ref 0–44)
AST: 22 U/L (ref 15–41)
Albumin: 3.6 g/dL (ref 3.5–5.0)
Alkaline Phosphatase: 75 U/L (ref 38–126)
Anion gap: 9 (ref 5–15)
BUN: 12 mg/dL (ref 6–20)
CO2: 25 mmol/L (ref 22–32)
Calcium: 9.3 mg/dL (ref 8.9–10.3)
Chloride: 109 mmol/L (ref 98–111)
Creatinine: 0.8 mg/dL (ref 0.44–1.00)
GFR, Est AFR Am: >60 mL/min (ref >60)
GFR, Estimated: >60 mL/min (ref >60)
Glucose, Bld: 103 mg/dL — ABNORMAL HIGH (ref 70–99)
Potassium: 3.9 mmol/L (ref 3.5–5.1)
Sodium: 143 mmol/L (ref 135–145)
Total Bilirubin: 0.4 mg/dL (ref 0.3–1.2)
Total Protein: 6.5 g/dL (ref 6.5–8.1)

## 2018-11-21 LAB — CBC WITH DIFFERENTIAL (CANCER CENTER ONLY)
Abs Immature Granulocytes: 0.05 10*3/uL (ref 0.00–0.07)
Basophils Absolute: 0 10*3/uL (ref 0.0–0.1)
Basophils Relative: 1 %
Eosinophils Absolute: 0.1 10*3/uL (ref 0.0–0.5)
Eosinophils Relative: 2 %
HCT: 33.5 % — ABNORMAL LOW (ref 36.0–46.0)
Hemoglobin: 11.1 g/dL — ABNORMAL LOW (ref 12.0–15.0)
Immature Granulocytes: 2 %
Lymphocytes Relative: 27 %
Lymphs Abs: 0.6 10*3/uL — ABNORMAL LOW (ref 0.7–4.0)
MCH: 32.3 pg (ref 26.0–34.0)
MCHC: 33.1 g/dL (ref 30.0–36.0)
MCV: 97.4 fL (ref 80.0–100.0)
Monocytes Absolute: 0.3 10*3/uL (ref 0.1–1.0)
Monocytes Relative: 14 %
Neutro Abs: 1.3 10*3/uL — ABNORMAL LOW (ref 1.7–7.7)
Neutrophils Relative %: 54 %
Platelet Count: 180 10*3/uL (ref 150–400)
RBC: 3.44 MIL/uL — ABNORMAL LOW (ref 3.87–5.11)
RDW: 16.6 % — ABNORMAL HIGH (ref 11.5–15.5)
WBC Count: 2.4 10*3/uL — ABNORMAL LOW (ref 4.0–10.5)
nRBC: 0 % (ref 0.0–0.2)

## 2018-11-21 MED ORDER — HEPARIN SOD (PORK) LOCK FLUSH 100 UNIT/ML IV SOLN
500.0000 [IU] | Freq: Once | INTRAVENOUS | Status: AC | PRN
  Administered 2018-11-21: 500 [IU]
  Filled 2018-11-21: qty 5

## 2018-11-21 MED ORDER — SODIUM CHLORIDE 0.9 % IV SOLN
Freq: Once | INTRAVENOUS | Status: AC
  Administered 2018-11-21: 09:00:00 via INTRAVENOUS
  Filled 2018-11-21: qty 250

## 2018-11-21 MED ORDER — SODIUM CHLORIDE 0.9% FLUSH
10.0000 mL | INTRAVENOUS | Status: DC | PRN
  Administered 2018-11-21: 10 mL
  Filled 2018-11-21: qty 10

## 2018-11-21 MED ORDER — DIPHENHYDRAMINE HCL 50 MG/ML IJ SOLN
25.0000 mg | Freq: Once | INTRAMUSCULAR | Status: AC
  Administered 2018-11-21: 25 mg via INTRAVENOUS

## 2018-11-21 MED ORDER — FAMOTIDINE IN NACL 20-0.9 MG/50ML-% IV SOLN
20.0000 mg | Freq: Once | INTRAVENOUS | Status: AC
  Administered 2018-11-21: 20 mg via INTRAVENOUS

## 2018-11-21 MED ORDER — DEXAMETHASONE SODIUM PHOSPHATE 10 MG/ML IJ SOLN
10.0000 mg | Freq: Once | INTRAMUSCULAR | Status: AC
  Administered 2018-11-21: 10 mg via INTRAVENOUS

## 2018-11-21 MED ORDER — DIPHENHYDRAMINE HCL 50 MG/ML IJ SOLN
INTRAMUSCULAR | Status: AC
  Filled 2018-11-21: qty 1

## 2018-11-21 MED ORDER — DEXAMETHASONE SODIUM PHOSPHATE 10 MG/ML IJ SOLN
INTRAMUSCULAR | Status: AC
  Filled 2018-11-21: qty 1

## 2018-11-21 MED ORDER — SODIUM CHLORIDE 0.9 % IV SOLN
80.0000 mg/m2 | Freq: Once | INTRAVENOUS | Status: AC
  Administered 2018-11-21: 180 mg via INTRAVENOUS
  Filled 2018-11-21: qty 30

## 2018-11-21 MED ORDER — FAMOTIDINE IN NACL 20-0.9 MG/50ML-% IV SOLN
INTRAVENOUS | Status: AC
  Filled 2018-11-21: qty 50

## 2018-11-21 NOTE — Progress Notes (Signed)
Per Dr. Jana Hakim (covering for Dr. [name]), okay to treat with ANC of 1.3

## 2018-11-21 NOTE — Patient Instructions (Signed)
[facility] Discharge Instructions for Patients Receiving Chemotherapy  Today you received the following chemotherapy agents:  Taxol.  To help prevent nausea and vomiting after your treatment, we encourage you to take your nausea medication as directed.   If you develop nausea and vomiting that is not controlled by your nausea medication, call the clinic.   BELOW ARE SYMPTOMS THAT SHOULD BE REPORTED IMMEDIATELY:  *FEVER GREATER THAN 100.5 F  *CHILLS WITH OR WITHOUT FEVER  NAUSEA AND VOMITING THAT IS NOT CONTROLLED WITH YOUR NAUSEA MEDICATION  *UNUSUAL SHORTNESS OF BREATH  *UNUSUAL BRUISING OR BLEEDING  TENDERNESS IN MOUTH AND THROAT WITH OR WITHOUT PRESENCE OF ULCERS  *URINARY PROBLEMS  *BOWEL PROBLEMS  UNUSUAL RASH Items with * indicate a potential emergency and should be followed up as soon as possible.  Feel free to call the clinic should you have any questions or concerns. The clinic phone number is [phone].  Please show the CHEMO ALERT CARD at check-in to the Emergency Department and triage nurse.   

## 2018-11-28 ENCOUNTER — Inpatient Hospital Stay: Payer: BLUE CROSS/BLUE SHIELD

## 2018-11-28 ENCOUNTER — Inpatient Hospital Stay (HOSPITAL_BASED_OUTPATIENT_CLINIC_OR_DEPARTMENT_OTHER): Payer: BLUE CROSS/BLUE SHIELD | Admitting: Adult Health

## 2018-11-28 ENCOUNTER — Encounter: Payer: Self-pay | Admitting: Adult Health

## 2018-11-28 ENCOUNTER — Telehealth: Payer: Self-pay | Admitting: Adult Health

## 2018-11-28 VITALS — BP 137/93 | HR 76 | Temp 97.7°F | Resp 18 | Ht 66.5 in | Wt 232.3 lb

## 2018-11-28 DIAGNOSIS — D696 Thrombocytopenia, unspecified: Secondary | ICD-10-CM | POA: Diagnosis not present

## 2018-11-28 DIAGNOSIS — E785 Hyperlipidemia, unspecified: Secondary | ICD-10-CM

## 2018-11-28 DIAGNOSIS — Z5111 Encounter for antineoplastic chemotherapy: Secondary | ICD-10-CM | POA: Diagnosis not present

## 2018-11-28 DIAGNOSIS — Z800 Family history of malignant neoplasm of digestive organs: Secondary | ICD-10-CM

## 2018-11-28 DIAGNOSIS — C50411 Malignant neoplasm of upper-outer quadrant of right female breast: Secondary | ICD-10-CM

## 2018-11-28 DIAGNOSIS — K5900 Constipation, unspecified: Secondary | ICD-10-CM | POA: Diagnosis not present

## 2018-11-28 DIAGNOSIS — I10 Essential (primary) hypertension: Secondary | ICD-10-CM

## 2018-11-28 DIAGNOSIS — Z95828 Presence of other vascular implants and grafts: Secondary | ICD-10-CM

## 2018-11-28 DIAGNOSIS — Z171 Estrogen receptor negative status [ER-]: Secondary | ICD-10-CM | POA: Diagnosis not present

## 2018-11-28 DIAGNOSIS — R21 Rash and other nonspecific skin eruption: Secondary | ICD-10-CM

## 2018-11-28 LAB — CBC WITH DIFFERENTIAL (CANCER CENTER ONLY)
Abs Immature Granulocytes: 0.03 10*3/uL (ref 0.00–0.07)
Basophils Absolute: 0 10*3/uL (ref 0.0–0.1)
Basophils Relative: 1 %
Eosinophils Absolute: 0.1 10*3/uL (ref 0.0–0.5)
Eosinophils Relative: 2 %
HCT: 34.3 % — ABNORMAL LOW (ref 36.0–46.0)
Hemoglobin: 11.4 g/dL — ABNORMAL LOW (ref 12.0–15.0)
IMMATURE GRANULOCYTES: 1 %
Lymphocytes Relative: 22 %
Lymphs Abs: 0.6 10*3/uL — ABNORMAL LOW (ref 0.7–4.0)
MCH: 32.5 pg (ref 26.0–34.0)
MCHC: 33.2 g/dL (ref 30.0–36.0)
MCV: 97.7 fL (ref 80.0–100.0)
Monocytes Absolute: 0.3 10*3/uL (ref 0.1–1.0)
Monocytes Relative: 11 %
NEUTROS PCT: 63 %
Neutro Abs: 1.7 10*3/uL (ref 1.7–7.7)
Platelet Count: 170 10*3/uL (ref 150–400)
RBC: 3.51 MIL/uL — ABNORMAL LOW (ref 3.87–5.11)
RDW: 16 % — ABNORMAL HIGH (ref 11.5–15.5)
WBC Count: 2.6 10*3/uL — ABNORMAL LOW (ref 4.0–10.5)
nRBC: 0 % (ref 0.0–0.2)

## 2018-11-28 LAB — CMP (CANCER CENTER ONLY)
ALT: 35 U/L (ref 0–44)
AST: 23 U/L (ref 15–41)
Albumin: 3.8 g/dL (ref 3.5–5.0)
Alkaline Phosphatase: 75 U/L (ref 38–126)
Anion gap: 9 (ref 5–15)
BUN: 11 mg/dL (ref 6–20)
CO2: 25 mmol/L (ref 22–32)
Calcium: 9.2 mg/dL (ref 8.9–10.3)
Chloride: 107 mmol/L (ref 98–111)
Creatinine: 0.79 mg/dL (ref 0.44–1.00)
GFR, Est AFR Am: >60 mL/min (ref >60)
GFR, Estimated: >60 mL/min (ref >60)
Glucose, Bld: 95 mg/dL (ref 70–99)
Potassium: 3.9 mmol/L (ref 3.5–5.1)
SODIUM: 141 mmol/L (ref 135–145)
Total Bilirubin: 0.4 mg/dL (ref 0.3–1.2)
Total Protein: 6.7 g/dL (ref 6.5–8.1)

## 2018-11-28 MED ORDER — SODIUM CHLORIDE 0.9 % IV SOLN
80.0000 mg/m2 | Freq: Once | INTRAVENOUS | Status: AC
  Administered 2018-11-28: 180 mg via INTRAVENOUS
  Filled 2018-11-28: qty 30

## 2018-11-28 MED ORDER — HEPARIN SOD (PORK) LOCK FLUSH 100 UNIT/ML IV SOLN
500.0000 [IU] | Freq: Once | INTRAVENOUS | Status: AC | PRN
  Administered 2018-11-28: 500 [IU]
  Filled 2018-11-28: qty 5

## 2018-11-28 MED ORDER — DEXAMETHASONE SODIUM PHOSPHATE 10 MG/ML IJ SOLN
10.0000 mg | Freq: Once | INTRAMUSCULAR | Status: AC
  Administered 2018-11-28: 10 mg via INTRAVENOUS

## 2018-11-28 MED ORDER — DIPHENHYDRAMINE HCL 50 MG/ML IJ SOLN
INTRAMUSCULAR | Status: AC
  Filled 2018-11-28: qty 1

## 2018-11-28 MED ORDER — SODIUM CHLORIDE 0.9 % IV SOLN
Freq: Once | INTRAVENOUS | Status: AC
  Administered 2018-11-28: 10:00:00 via INTRAVENOUS
  Filled 2018-11-28: qty 250

## 2018-11-28 MED ORDER — SODIUM CHLORIDE 0.9% FLUSH
10.0000 mL | INTRAVENOUS | Status: DC | PRN
  Administered 2018-11-28: 10 mL
  Filled 2018-11-28: qty 10

## 2018-11-28 MED ORDER — FAMOTIDINE IN NACL 20-0.9 MG/50ML-% IV SOLN
INTRAVENOUS | Status: AC
  Filled 2018-11-28: qty 50

## 2018-11-28 MED ORDER — DIPHENHYDRAMINE HCL 50 MG/ML IJ SOLN
25.0000 mg | Freq: Once | INTRAMUSCULAR | Status: AC
  Administered 2018-11-28: 25 mg via INTRAVENOUS

## 2018-11-28 MED ORDER — FAMOTIDINE IN NACL 20-0.9 MG/50ML-% IV SOLN
20.0000 mg | Freq: Once | INTRAVENOUS | Status: AC
  Administered 2018-11-28: 20 mg via INTRAVENOUS

## 2018-11-28 MED ORDER — KETOCONAZOLE 2 % EX CREA: 1.0000 "application " | TOPICAL_CREAM | Freq: Every day | CUTANEOUS | 0 refills | Status: DC

## 2018-11-28 MED ORDER — DEXAMETHASONE SODIUM PHOSPHATE 10 MG/ML IJ SOLN
INTRAMUSCULAR | Status: AC
  Filled 2018-11-28: qty 1

## 2018-11-28 NOTE — Patient Instructions (Signed)
[facility] Discharge Instructions for Patients Receiving Chemotherapy  Today you received the following chemotherapy agents:  Taxol.  To help prevent nausea and vomiting after your treatment, we encourage you to take your nausea medication as directed.   If you develop nausea and vomiting that is not controlled by your nausea medication, call the clinic.   BELOW ARE SYMPTOMS THAT SHOULD BE REPORTED IMMEDIATELY:  *FEVER GREATER THAN 100.5 F  *CHILLS WITH OR WITHOUT FEVER  NAUSEA AND VOMITING THAT IS NOT CONTROLLED WITH YOUR NAUSEA MEDICATION  *UNUSUAL SHORTNESS OF BREATH  *UNUSUAL BRUISING OR BLEEDING  TENDERNESS IN MOUTH AND THROAT WITH OR WITHOUT PRESENCE OF ULCERS  *URINARY PROBLEMS  *BOWEL PROBLEMS  UNUSUAL RASH Items with * indicate a potential emergency and should be followed up as soon as possible.  Feel free to call the clinic should you have any questions or concerns. The clinic phone number is [phone].  Please show the CHEMO ALERT CARD at check-in to the Emergency Department and triage nurse.   

## 2018-11-28 NOTE — Assessment & Plan Note (Addendum)
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  Treatment plan: 1.Adjuvant chemotherapy with dose dense Adriamycin and Cytoxan x4 followed by Taxol weekly x12 2.Adjuvant radiation therapy  UPBEAT clinical trial ([id number]): No adverse effects being on the clinical trial _________________________________________________________________________________________ Current treatment: Taxol weekly   Chemo toxicities: 1. Facial rash: sent in metrogel to apply daily, also has triamcinolone 2. Thrombocytopenia: weekly CBC, plts improved today 3. Constipation: recommended stool softeners and Miralax, resolved at this point 4. Fungal rash under breast: Ketoconazole cream prescribed   Return to clinic weekly for labs and Taxol, every other week for labs, f/u with myself or Dr. [name], and Taxol.

## 2018-11-28 NOTE — Telephone Encounter (Signed)
No 12/18 los or referrals.  °

## 2018-11-28 NOTE — Progress Notes (Signed)
[facility] Follow up:    Jennifer Neer, MD [address] [place] [address]   DIAGNOSIS: Cancer Staging Malignant neoplasm of upper-outer quadrant of right breast in female, estrogen receptor negative ([place]) Staging form: Breast, AJCC 8th Edition - Clinical: Stage IB (cT1c, cN0, cM0, G2, ER-, PR-, HER2-) - Unsigned - Pathologic stage from 08/07/2018: Stage IB (pT1c, pN0, cM0, G2, ER-, PR-, HER2-) - Signed by Gardenia Phlegm, NP on 08/22/2018   SUMMARY OF ONCOLOGIC HISTORY:   Malignant neoplasm of upper-outer quadrant of right breast in female, estrogen receptor negative ([place])   07/13/2018 Initial Diagnosis    Screening detected right breast asymmetry UOQ 10 o'clock position 1.4 cm, axilla negative, biopsy revealed grade 2 IDC triple negative with a Ki-67 of 50% T1c N0 stage Ib AJCC 8    08/06/2018 Genetic Testing    NBN c.2117C>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  The Hereditary Gene Panel offered by [facility] includes sequencing and/or deletion duplication testing of the following 47 genes: APC, ATM, AXIN2, BARD1, BMPR1A, BRCA1, BRCA2, BRIP1, CDH1, CDK4, CDKN2A (p14ARF), CDKN2A (p16INK4a), CHEK2, CTNNA1, DICER1, EPCAM (Deletion/duplication testing only), GREM1 (promoter region deletion/duplication testing only), KIT, MEN1, MLH1, MSH2, MSH3, MSH6, MUTYH, NBN, NF1, NHTL1, PALB2, PDGFRA, PMS2, POLD1, POLE, PTEN, RAD50, RAD51C, RAD51D, SDHB, SDHC, SDHD, SMAD4, SMARCA4. STK11, TP53, TSC1, TSC2, and VHL.  The following genes were evaluated for sequence changes only: SDHA and HOXB13 c.251G>A variant only. The report date is August 06, 2018.    08/07/2018 Surgery    Right lumpectomy: IDC grade 2, 1.5 cm, with intermediate grade DCIS, margins negative, 0/3 lymph nodes negative, triple negative with Ki-67 of 50%, T1 CN 0 stage IB    08/07/2018 Cancer Staging    Staging form:  Breast, AJCC 8th Edition - Pathologic stage from 08/07/2018: Stage IB (pT1c, pN0, cM0, G2, ER-, PR-, HER2-) - Signed by Gardenia Phlegm, NP on 08/22/2018    09/05/2018 -  Chemotherapy    Adriamycin and Cytoxan x 4 to be followed by weekly Taxol    11/07/2018 -  Chemotherapy    The patient had PACLitaxel (TAXOL) 180 mg in sodium chloride 0.9 % 250 mL chemo infusion (</= 18m/m2), 80 mg/m2 = 180 mg, Intravenous,  Once, 4 of 12 cycles Administration: 180 mg (11/07/2018), 180 mg (11/14/2018), 180 mg (11/21/2018)  for chemotherapy treatment.      CURRENT THERAPY: Taxol  INTERVAL HISTORY: Jennifer Carroll 52y.o. female returns for evaluation prior to receiving her fourth week of adjuvant chemotherapy with Taxol.  She is tolerating treatment well.  She is slightly fatigued.  She continues to work.  She denies peripheral neuropathy.  She does have a facial rash.  This is stable and she has cream that helps it.  She also notes that she has a rash underneath her left breast that started this morning.  She applied a fungal powder to this area this morning.   Patient Active Problem List   Diagnosis Date Noted  . Port-A-Cath in place 09/12/2018  . 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 Genetic testing 08/06/2018  . Family history of pancreatic cancer   . Malignant neoplasm of upper-outer quadrant of right breast in female, estrogen receptor negative ([place] 07/18/2018  . Irregular menses 05/08/2012    has No Known Allergies.  MEDICAL HISTORY: Past Medical History:  Diagnosis Date  . AMA (advanced maternal age)  multigravida 35+ 03/28/05   with no amniocentesis  . 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 Cancer [facility])    Right breast   . Family history of pancreatic cancer   . H/O infertility   . H/O menorrhagia   . History of chicken pox   . History of measles, mumps, or rubella   . Hyperlipidemia   . Hypertension   . Low iron   . Menses, irregular   . Monoallelic  mutation of NBN gene in female 08/14/2018   Patient had genetic testing reported out on 08/06/2018 that revealed a heterozygous pathogenic variant in NBN c.2117C>G (p.Ser706*).   [name]     SURGICAL HISTORY: Past Surgical History:  Procedure Laterality Date  . BREAST LUMPECTOMY WITH RADIOACTIVE SEED AND SENTINEL LYMPH NODE BIOPSY Right 08/07/2018   Procedure: INJECT BLUE DYE RIGHT BREAST,RIGHT BREAST LUMPECTOMY WITH RADIOACTIVE SEED AND RIGHT AXILLARY DEEP SENTINEL LYMPH NODE BIOPSY;  Surgeon: Fanny Skates, MD;  Location: [place];  Service: General;  Laterality: Right;  . EYE SURGERY     left eye at age 55  . head surgery     to cover hole in head at age of  63 yrs young  . PORTACATH PLACEMENT Left 08/07/2018   Procedure: INSERTION PORT-A-CATH;  Surgeon: Fanny Skates, MD;  Location: [facility];  Service: General;  Laterality: Left;  . WISDOM TOOTH EXTRACTION  2001    SOCIAL HISTORY: Social History   Socioeconomic History  . Marital status: Married    Spouse name: Not on file  . Number of children: Not on file  . Years of education: Not on file  . Highest education level: Not on file  Occupational History  . Not on file  Social Needs  . Financial resource strain: Not on file  . Food insecurity:    Worry: Not on file    Inability: Not on file  . Transportation needs:    Medical: Not on file    Non-medical: Not on file  Tobacco Use  . Smoking status: Never Smoker  . Smokeless tobacco: Never Used  Substance and Sexual Activity  . Alcohol use: Not Currently  . Drug use: Never  . Sexual activity: Yes  Lifestyle  . Physical activity:    Days per week: Not on file    Minutes per session: Not on file  . Stress: Not on file  Relationships  . Social connections:    Talks on phone: Not on file    Gets together: Not on file    Attends religious service: Not on file    Active member of club or organization: Not on file    Attends  meetings of clubs or organizations: Not on file    Relationship status: Not on file  . Intimate partner violence:    Fear of current or ex partner: Not on file    Emotionally abused: Not on file    Physically abused: Not on file    Forced sexual activity: Not on file  Other Topics Concern  . Not on file  Social History Narrative  . Not on file    FAMILY HISTORY: Family History  Problem Relation Age of Onset  . Hypertension Mother   . Kidney disease Mother   . Heart disease Father   . Hypertension Brother   . Kidney failure Maternal Grandfather   . Pancreatic cancer Cousin        dx >50    Review  of Systems  Constitutional: Positive for fatigue. Negative for appetite change, chills, fever and unexpected weight change.  HENT:   Negative for hearing loss, lump/mass, mouth sores and trouble swallowing.   Eyes: Negative for eye problems and icterus.  Respiratory: Negative for chest tightness, cough and shortness of breath.   Cardiovascular: Negative for chest pain, leg swelling and palpitations.  Gastrointestinal: Negative for abdominal distention, abdominal pain, constipation, diarrhea, nausea and vomiting.  Endocrine: Negative for hot flashes.  Musculoskeletal: Negative for arthralgias.  Skin: Negative for itching and rash.  Neurological: Negative for dizziness, extremity weakness, headaches and numbness.  Hematological: Negative for adenopathy. Does not bruise/bleed easily.  Psychiatric/Behavioral: Negative for depression. The patient is not nervous/anxious.       PHYSICAL EXAMINATION  ECOG PERFORMANCE STATUS: 1 - Symptomatic but completely ambulatory  Vitals:   11/28/18 0923  BP: (!) 137/93  Pulse: 76  Resp: 18  Temp: 97.7 F (36.5 C)  SpO2: 99%    Physical Exam Constitutional:      General: She is not in acute distress.    Appearance: Normal appearance. She is not toxic-appearing.  HENT:     Head: Normocephalic and atraumatic.     Mouth/Throat:     Mouth:  Mucous membranes are moist.     Pharynx: No oropharyngeal exudate or posterior oropharyngeal erythema.  Eyes:     General: No scleral icterus.    Pupils: Pupils are equal, round, and reactive to light.  Neck:     Musculoskeletal: Neck supple.  Cardiovascular:     Rate and Rhythm: Normal rate and regular rhythm.     Heart sounds: Normal heart sounds.  Pulmonary:     Effort: Pulmonary effort is normal.     Breath sounds: Normal breath sounds.     Comments: Right breast s/p lumpectomy, no sign of local recurrence, left breast benign, fungal appearing rash in left inframammary fold Abdominal:     General: Abdomen is flat. Bowel sounds are normal. There is no distension.     Palpations: Abdomen is soft.     Tenderness: There is no abdominal tenderness.  Musculoskeletal:        General: No swelling.  Skin:    General: Skin is warm and dry.     Capillary Refill: Capillary refill takes less than 2 seconds.     Comments: Facial rash continues and unchanged, erythema with small areas of pustules  Neurological:     General: No focal deficit present.     Mental Status: She is alert and oriented to person, place, and time.  Psychiatric:        Mood and Affect: Mood normal.        Behavior: Behavior normal.     LABORATORY DATA:  CBC    Component Value Date/Time   WBC 2.6 (L) 11/28/2018 0816   RBC 3.51 (L) 11/28/2018 0816   HGB 11.4 (L) 11/28/2018 0816   HCT 34.3 (L) 11/28/2018 0816   PLT 170 11/28/2018 0816   MCV 97.7 11/28/2018 0816   MCH 32.5 11/28/2018 0816   MCHC 33.2 11/28/2018 0816   RDW 16.0 (H) 11/28/2018 0816   LYMPHSABS 0.6 (L) 11/28/2018 0816   MONOABS 0.3 11/28/2018 0816   EOSABS 0.1 11/28/2018 0816   BASOSABS 0.0 11/28/2018 0816    CMP     Component Value Date/Time   NA 141 11/28/2018 0816   K 3.9 11/28/2018 0816   CL 107 11/28/2018 0816   CO2 25 11/28/2018 0816  GLUCOSE 95 11/28/2018 0816   BUN 11 11/28/2018 0816   CREATININE 0.79 11/28/2018 0816    CALCIUM 9.2 11/28/2018 0816   PROT 6.7 11/28/2018 0816   ALBUMIN 3.8 11/28/2018 0816   AST 23 11/28/2018 0816   ALT 35 11/28/2018 0816   ALKPHOS 75 11/28/2018 0816   BILITOT 0.4 11/28/2018 0816   GFRNONAA >60 11/28/2018 0816   GFRAA >60 11/28/2018 0816           ASSESSMENT and PLAN:   Malignant neoplasm of upper-outer quadrant of right breast in female, estrogen receptor negative ([place]) 08/07/2018:Right lumpectomy: IDC grade 2, 1.5 cm, with intermediate grade DCIS, margins negative, 0/3 lymph nodes negative, triple negative with Ki-67 of 50%, T1 CN 0 stage IB Patient works at [employer] and can work from home if necessary.  Treatment plan: 1.Adjuvant chemotherapy with dose dense Adriamycin and Cytoxan x4 followed by Taxol weekly x12 2.Adjuvant radiation therapy  UPBEAT clinical trial ([id number]): No adverse effects being on the clinical trial _________________________________________________________________________________________ Current treatment: Taxol weekly   Chemo toxicities: 1. Facial rash: sent in metrogel to apply daily, also has triamcinolone 2. Thrombocytopenia: weekly CBC, plts improved today 3. Constipation: recommended stool softeners and Miralax, resolved at this point 4. Fungal rash under breast: Ketoconazole cream prescribed   Return to clinic weekly for labs and Taxol, every other week for labs, f/u with myself or Dr. [name], and Taxol.      All questions were answered. The patient knows to call the clinic with any problems, questions or concerns. We can certainly see the patient much sooner if necessary.  A total of (20) minutes of face-to-face time was spent with this patient with greater than 50% of that time in counseling and care-coordination.   This note was electronically signed. Scot Dock, NP 11/28/2018

## 2018-12-06 ENCOUNTER — Inpatient Hospital Stay: Payer: BLUE CROSS/BLUE SHIELD

## 2018-12-06 VITALS — BP 144/84 | HR 75 | Temp 97.9°F | Resp 17

## 2018-12-06 DIAGNOSIS — Z95828 Presence of other vascular implants and grafts: Secondary | ICD-10-CM

## 2018-12-06 DIAGNOSIS — E785 Hyperlipidemia, unspecified: Secondary | ICD-10-CM | POA: Diagnosis not present

## 2018-12-06 DIAGNOSIS — I10 Essential (primary) hypertension: Secondary | ICD-10-CM | POA: Diagnosis not present

## 2018-12-06 DIAGNOSIS — C50411 Malignant neoplasm of upper-outer quadrant of right female breast: Secondary | ICD-10-CM | POA: Diagnosis not present

## 2018-12-06 DIAGNOSIS — Z171 Estrogen receptor negative status [ER-]: Principal | ICD-10-CM

## 2018-12-06 DIAGNOSIS — Z5111 Encounter for antineoplastic chemotherapy: Secondary | ICD-10-CM | POA: Diagnosis not present

## 2018-12-06 DIAGNOSIS — Z800 Family history of malignant neoplasm of digestive organs: Secondary | ICD-10-CM | POA: Diagnosis not present

## 2018-12-06 DIAGNOSIS — D696 Thrombocytopenia, unspecified: Secondary | ICD-10-CM | POA: Diagnosis not present

## 2018-12-06 DIAGNOSIS — R21 Rash and other nonspecific skin eruption: Secondary | ICD-10-CM | POA: Diagnosis not present

## 2018-12-06 DIAGNOSIS — K5900 Constipation, unspecified: Secondary | ICD-10-CM | POA: Diagnosis not present

## 2018-12-06 LAB — CMP (CANCER CENTER ONLY)
ALT: 33 U/L (ref 0–44)
AST: 22 U/L (ref 15–41)
Albumin: 3.7 g/dL (ref 3.5–5.0)
Alkaline Phosphatase: 71 U/L (ref 38–126)
Anion gap: 6 (ref 5–15)
BUN: 10 mg/dL (ref 6–20)
CO2: 26 mmol/L (ref 22–32)
Calcium: 9.2 mg/dL (ref 8.9–10.3)
Chloride: 108 mmol/L (ref 98–111)
Creatinine: 0.81 mg/dL (ref 0.44–1.00)
GFR, Est AFR Am: >60 mL/min (ref >60)
GFR, Estimated: >60 mL/min (ref >60)
Glucose, Bld: 102 mg/dL — ABNORMAL HIGH (ref 70–99)
Potassium: 3.8 mmol/L (ref 3.5–5.1)
Sodium: 140 mmol/L (ref 135–145)
Total Bilirubin: 0.3 mg/dL (ref 0.3–1.2)
Total Protein: 6.7 g/dL (ref 6.5–8.1)

## 2018-12-06 LAB — CBC WITH DIFFERENTIAL (CANCER CENTER ONLY)
Abs Immature Granulocytes: 0.01 10*3/uL (ref 0.00–0.07)
Basophils Absolute: 0 10*3/uL (ref 0.0–0.1)
Basophils Relative: 1 %
Eosinophils Absolute: 0.1 10*3/uL (ref 0.0–0.5)
Eosinophils Relative: 3 %
HCT: 33.5 % — ABNORMAL LOW (ref 36.0–46.0)
Hemoglobin: 11 g/dL — ABNORMAL LOW (ref 12.0–15.0)
Immature Granulocytes: 0 %
LYMPHS ABS: 0.6 10*3/uL — AB (ref 0.7–4.0)
Lymphocytes Relative: 24 %
MCH: 32.9 pg (ref 26.0–34.0)
MCHC: 32.8 g/dL (ref 30.0–36.0)
MCV: 100.3 fL — AB (ref 80.0–100.0)
MONO ABS: 0.3 10*3/uL (ref 0.1–1.0)
MONOS PCT: 12 %
Neutro Abs: 1.5 10*3/uL — ABNORMAL LOW (ref 1.7–7.7)
Neutrophils Relative %: 60 %
Platelet Count: 161 10*3/uL (ref 150–400)
RBC: 3.34 MIL/uL — ABNORMAL LOW (ref 3.87–5.11)
RDW: 15.4 % (ref 11.5–15.5)
WBC Count: 2.5 10*3/uL — ABNORMAL LOW (ref 4.0–10.5)
nRBC: 0 % (ref 0.0–0.2)

## 2018-12-06 MED ORDER — SODIUM CHLORIDE 0.9% FLUSH
10.0000 mL | INTRAVENOUS | Status: DC | PRN
  Administered 2018-12-06: 10 mL
  Filled 2018-12-06: qty 10

## 2018-12-06 MED ORDER — FAMOTIDINE IN NACL 20-0.9 MG/50ML-% IV SOLN
20.0000 mg | Freq: Once | INTRAVENOUS | Status: AC
  Administered 2018-12-06: 20 mg via INTRAVENOUS

## 2018-12-06 MED ORDER — FAMOTIDINE IN NACL 20-0.9 MG/50ML-% IV SOLN
INTRAVENOUS | Status: AC
  Filled 2018-12-06: qty 50

## 2018-12-06 MED ORDER — DEXAMETHASONE SODIUM PHOSPHATE 10 MG/ML IJ SOLN
10.0000 mg | Freq: Once | INTRAMUSCULAR | Status: AC
  Administered 2018-12-06: 10 mg via INTRAVENOUS

## 2018-12-06 MED ORDER — DIPHENHYDRAMINE HCL 50 MG/ML IJ SOLN
25.0000 mg | Freq: Once | INTRAMUSCULAR | Status: AC
  Administered 2018-12-06: 25 mg via INTRAVENOUS

## 2018-12-06 MED ORDER — DEXAMETHASONE SODIUM PHOSPHATE 10 MG/ML IJ SOLN
INTRAMUSCULAR | Status: AC
  Filled 2018-12-06: qty 1

## 2018-12-06 MED ORDER — SODIUM CHLORIDE 0.9 % IV SOLN
80.0000 mg/m2 | Freq: Once | INTRAVENOUS | Status: AC
  Administered 2018-12-06: 180 mg via INTRAVENOUS
  Filled 2018-12-06: qty 30

## 2018-12-06 MED ORDER — DIPHENHYDRAMINE HCL 50 MG/ML IJ SOLN
INTRAMUSCULAR | Status: AC
  Filled 2018-12-06: qty 1

## 2018-12-06 MED ORDER — SODIUM CHLORIDE 0.9 % IV SOLN
Freq: Once | INTRAVENOUS | Status: AC
  Administered 2018-12-06: 10:00:00 via INTRAVENOUS
  Filled 2018-12-06: qty 250

## 2018-12-06 MED ORDER — HEPARIN SOD (PORK) LOCK FLUSH 100 UNIT/ML IV SOLN
500.0000 [IU] | Freq: Once | INTRAVENOUS | Status: AC | PRN
  Administered 2018-12-06: 500 [IU]
  Filled 2018-12-06: qty 5

## 2018-12-06 NOTE — Patient Instructions (Signed)
[facility] Discharge Instructions for Patients Receiving Chemotherapy  Today you received the following chemotherapy agents:  Taxol.  To help prevent nausea and vomiting after your treatment, we encourage you to take your nausea medication as directed.   If you develop nausea and vomiting that is not controlled by your nausea medication, call the clinic.   BELOW ARE SYMPTOMS THAT SHOULD BE REPORTED IMMEDIATELY:  *FEVER GREATER THAN 100.5 F  *CHILLS WITH OR WITHOUT FEVER  NAUSEA AND VOMITING THAT IS NOT CONTROLLED WITH YOUR NAUSEA MEDICATION  *UNUSUAL SHORTNESS OF BREATH  *UNUSUAL BRUISING OR BLEEDING  TENDERNESS IN MOUTH AND THROAT WITH OR WITHOUT PRESENCE OF ULCERS  *URINARY PROBLEMS  *BOWEL PROBLEMS  UNUSUAL RASH Items with * indicate a potential emergency and should be followed up as soon as possible.  Feel free to call the clinic should you have any questions or concerns. The clinic phone number is [phone].  Please show the CHEMO ALERT CARD at check-in to the Emergency Department and triage nurse.   

## 2018-12-08 DIAGNOSIS — S91209A Unspecified open wound of unspecified toe(s) with damage to nail, initial encounter: Secondary | ICD-10-CM | POA: Diagnosis not present

## 2018-12-13 ENCOUNTER — Inpatient Hospital Stay: Payer: BLUE CROSS/BLUE SHIELD | Attending: Hematology and Oncology

## 2018-12-13 ENCOUNTER — Inpatient Hospital Stay (HOSPITAL_BASED_OUTPATIENT_CLINIC_OR_DEPARTMENT_OTHER): Payer: BLUE CROSS/BLUE SHIELD | Admitting: Adult Health

## 2018-12-13 ENCOUNTER — Inpatient Hospital Stay: Payer: BLUE CROSS/BLUE SHIELD

## 2018-12-13 ENCOUNTER — Encounter: Payer: Self-pay | Admitting: Adult Health

## 2018-12-13 VITALS — BP 115/88 | HR 87 | Temp 97.8°F | Resp 18 | Ht 66.5 in | Wt 232.6 lb

## 2018-12-13 DIAGNOSIS — R5383 Other fatigue: Secondary | ICD-10-CM | POA: Insufficient documentation

## 2018-12-13 DIAGNOSIS — B029 Zoster without complications: Secondary | ICD-10-CM | POA: Insufficient documentation

## 2018-12-13 DIAGNOSIS — Z923 Personal history of irradiation: Secondary | ICD-10-CM

## 2018-12-13 DIAGNOSIS — Z79899 Other long term (current) drug therapy: Secondary | ICD-10-CM | POA: Diagnosis not present

## 2018-12-13 DIAGNOSIS — D696 Thrombocytopenia, unspecified: Secondary | ICD-10-CM

## 2018-12-13 DIAGNOSIS — E785 Hyperlipidemia, unspecified: Secondary | ICD-10-CM

## 2018-12-13 DIAGNOSIS — Z9221 Personal history of antineoplastic chemotherapy: Secondary | ICD-10-CM

## 2018-12-13 DIAGNOSIS — Z800 Family history of malignant neoplasm of digestive organs: Secondary | ICD-10-CM | POA: Diagnosis not present

## 2018-12-13 DIAGNOSIS — K5900 Constipation, unspecified: Secondary | ICD-10-CM | POA: Insufficient documentation

## 2018-12-13 DIAGNOSIS — I10 Essential (primary) hypertension: Secondary | ICD-10-CM | POA: Insufficient documentation

## 2018-12-13 DIAGNOSIS — C50411 Malignant neoplasm of upper-outer quadrant of right female breast: Secondary | ICD-10-CM

## 2018-12-13 DIAGNOSIS — Z171 Estrogen receptor negative status [ER-]: Secondary | ICD-10-CM

## 2018-12-13 DIAGNOSIS — R21 Rash and other nonspecific skin eruption: Secondary | ICD-10-CM | POA: Diagnosis not present

## 2018-12-13 DIAGNOSIS — Z5111 Encounter for antineoplastic chemotherapy: Secondary | ICD-10-CM | POA: Insufficient documentation

## 2018-12-13 DIAGNOSIS — Z95828 Presence of other vascular implants and grafts: Secondary | ICD-10-CM

## 2018-12-13 LAB — CMP (CANCER CENTER ONLY)
ALT: 47 U/L — ABNORMAL HIGH (ref 0–44)
AST: 26 U/L (ref 15–41)
Albumin: 3.9 g/dL (ref 3.5–5.0)
Alkaline Phosphatase: 74 U/L (ref 38–126)
Anion gap: 9 (ref 5–15)
BUN: 16 mg/dL (ref 6–20)
CO2: 24 mmol/L (ref 22–32)
Calcium: 9.6 mg/dL (ref 8.9–10.3)
Chloride: 108 mmol/L (ref 98–111)
Creatinine: 0.88 mg/dL (ref 0.44–1.00)
GFR, Est AFR Am: >60 mL/min (ref >60)
GFR, Estimated: >60 mL/min (ref >60)
GLUCOSE: 114 mg/dL — AB (ref 70–99)
Potassium: 3.7 mmol/L (ref 3.5–5.1)
SODIUM: 141 mmol/L (ref 135–145)
Total Bilirubin: 0.3 mg/dL (ref 0.3–1.2)
Total Protein: 7 g/dL (ref 6.5–8.1)

## 2018-12-13 LAB — CBC WITH DIFFERENTIAL (CANCER CENTER ONLY)
Abs Immature Granulocytes: 0.02 10*3/uL (ref 0.00–0.07)
Basophils Absolute: 0 10*3/uL (ref 0.0–0.1)
Basophils Relative: 1 %
Eosinophils Absolute: 0.1 10*3/uL (ref 0.0–0.5)
Eosinophils Relative: 2 %
HCT: 37.9 % (ref 36.0–46.0)
Hemoglobin: 12.5 g/dL (ref 12.0–15.0)
Immature Granulocytes: 1 %
LYMPHS PCT: 20 %
Lymphs Abs: 0.7 10*3/uL (ref 0.7–4.0)
MCH: 33.1 pg (ref 26.0–34.0)
MCHC: 33 g/dL (ref 30.0–36.0)
MCV: 100.3 fL — ABNORMAL HIGH (ref 80.0–100.0)
Monocytes Absolute: 0.4 10*3/uL (ref 0.1–1.0)
Monocytes Relative: 11 %
Neutro Abs: 2.3 10*3/uL (ref 1.7–7.7)
Neutrophils Relative %: 65 %
Platelet Count: 193 10*3/uL (ref 150–400)
RBC: 3.78 MIL/uL — ABNORMAL LOW (ref 3.87–5.11)
RDW: 14.5 % (ref 11.5–15.5)
WBC Count: 3.4 10*3/uL — ABNORMAL LOW (ref 4.0–10.5)
nRBC: 0 % (ref 0.0–0.2)

## 2018-12-13 MED ORDER — FAMOTIDINE IN NACL 20-0.9 MG/50ML-% IV SOLN
INTRAVENOUS | Status: AC
  Filled 2018-12-13: qty 50

## 2018-12-13 MED ORDER — SODIUM CHLORIDE 0.9 % IV SOLN
80.0000 mg/m2 | Freq: Once | INTRAVENOUS | Status: AC
  Administered 2018-12-13: 180 mg via INTRAVENOUS
  Filled 2018-12-13: qty 30

## 2018-12-13 MED ORDER — SODIUM CHLORIDE 0.9 % IV SOLN
Freq: Once | INTRAVENOUS | Status: AC
  Administered 2018-12-13: 09:00:00 via INTRAVENOUS
  Filled 2018-12-13: qty 250

## 2018-12-13 MED ORDER — SODIUM CHLORIDE 0.9% FLUSH
10.0000 mL | INTRAVENOUS | Status: DC | PRN
  Administered 2018-12-13: 10 mL
  Filled 2018-12-13: qty 10

## 2018-12-13 MED ORDER — DIPHENHYDRAMINE HCL 50 MG/ML IJ SOLN
INTRAMUSCULAR | Status: AC
  Filled 2018-12-13: qty 1

## 2018-12-13 MED ORDER — DIPHENHYDRAMINE HCL 50 MG/ML IJ SOLN
25.0000 mg | Freq: Once | INTRAMUSCULAR | Status: AC
  Administered 2018-12-13: 25 mg via INTRAVENOUS

## 2018-12-13 MED ORDER — DEXAMETHASONE SODIUM PHOSPHATE 10 MG/ML IJ SOLN
10.0000 mg | Freq: Once | INTRAMUSCULAR | Status: AC
  Administered 2018-12-13: 10 mg via INTRAVENOUS

## 2018-12-13 MED ORDER — FAMOTIDINE IN NACL 20-0.9 MG/50ML-% IV SOLN
20.0000 mg | Freq: Once | INTRAVENOUS | Status: AC
  Administered 2018-12-13: 20 mg via INTRAVENOUS

## 2018-12-13 MED ORDER — DEXAMETHASONE SODIUM PHOSPHATE 10 MG/ML IJ SOLN
INTRAMUSCULAR | Status: AC
  Filled 2018-12-13: qty 1

## 2018-12-13 MED ORDER — HEPARIN SOD (PORK) LOCK FLUSH 100 UNIT/ML IV SOLN
500.0000 [IU] | Freq: Once | INTRAVENOUS | Status: AC | PRN
  Administered 2018-12-13: 500 [IU]
  Filled 2018-12-13: qty 5

## 2018-12-13 NOTE — Progress Notes (Signed)
[place] [facility] Follow up:    Jennifer Neer, MD [address] [place] [address]   DIAGNOSIS: Cancer Staging Malignant neoplasm of upper-outer quadrant of right breast in female, estrogen receptor negative ([place]) Staging form: Breast, AJCC 8th Edition - Clinical: Stage IB (cT1c, cN0, cM0, G2, ER-, PR-, HER2-) - Unsigned - Pathologic stage from 08/07/2018: Stage IB (pT1c, pN0, cM0, G2, ER-, PR-, HER2-) - Signed by Gardenia Phlegm, NP on 08/22/2018   SUMMARY OF ONCOLOGIC HISTORY:   Malignant neoplasm of upper-outer quadrant of right breast in female, estrogen receptor negative ([place])   07/13/2018 Initial Diagnosis    Screening detected right breast asymmetry UOQ 10 o'clock position 1.4 cm, axilla negative, biopsy revealed grade 2 IDC triple negative with a Ki-67 of 50% T1c N0 stage Ib AJCC 8    08/06/2018 Genetic Testing    NBN c.2117C>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  The Hereditary Gene Panel offered by [facility] includes sequencing and/or deletion duplication testing of the following 47 genes: APC, ATM, AXIN2, BARD1, BMPR1A, BRCA1, BRCA2, BRIP1, CDH1, CDK4, CDKN2A (p14ARF), CDKN2A (p16INK4a), CHEK2, CTNNA1, DICER1, EPCAM (Deletion/duplication testing only), GREM1 (promoter region deletion/duplication testing only), KIT, MEN1, MLH1, MSH2, MSH3, MSH6, MUTYH, NBN, NF1, NHTL1, PALB2, PDGFRA, PMS2, POLD1, POLE, PTEN, RAD50, RAD51C, RAD51D, SDHB, SDHC, SDHD, SMAD4, SMARCA4. STK11, TP53, TSC1, TSC2, and VHL.  The following genes were evaluated for sequence changes only: SDHA and HOXB13 c.251G>A variant only. The report date is August 06, 2018.    08/07/2018 Surgery    Right lumpectomy: IDC grade 2, 1.5 cm, with intermediate grade DCIS, margins negative, 0/3 lymph nodes negative, triple negative with Ki-67 of 50%, T1 CN 0 stage IB    08/07/2018 Cancer Staging    Staging form:  Breast, AJCC 8th Edition - Pathologic stage from 08/07/2018: Stage IB (pT1c, pN0, cM0, G2, ER-, PR-, HER2-) - Signed by Gardenia Phlegm, NP on 08/22/2018    09/05/2018 -  Chemotherapy    Adriamycin and Cytoxan x 4 to be followed by weekly Taxol    11/07/2018 -  Chemotherapy    The patient had PACLitaxel (TAXOL) 180 mg in sodium chloride 0.9 % 250 mL chemo infusion (</= 63m/m2), 80 mg/m2 = 180 mg, Intravenous,  Once, 5 of 12 cycles Administration: 180 mg (11/07/2018), 180 mg (11/14/2018), 180 mg (11/21/2018), 180 mg (11/28/2018), 180 mg (12/06/2018)  for chemotherapy treatment.      CURRENT THERAPY: weekly Taxol  INTERVAL HISTORY: Jennifer Carroll 53y.o. female returns for evaluation prior to receiving her sixth of 12 planned doses of weekly Taxol.  She is doing well.  She is slightly fatigued.  She continues to work.  She still has the facial rash, though much improved.  She applies cream to the rash which is helping.  She denies peripheral neuropathy.  She had a fungal rash under her breast the last time I saw her and has been applying the Ketoconazole cream I sent in for her which has been helping.     Patient Active Problem List   Diagnosis Date Noted  . Port-A-Cath in place 09/12/2018  . 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 Genetic testing 08/06/2018  . Family history of pancreatic cancer   . Malignant neoplasm of upper-outer quadrant of right breast in female, estrogen receptor negative ([place] 07/18/2018  . Irregular menses 05/08/2012    has No Known Allergies.  MEDICAL HISTORY: Past Medical History:  Diagnosis Date  . AMA (advanced maternal age) multigravida 35+ 03/28/05   with no amniocentesis  . 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 Cancer [facility])    Right breast   . Family history of pancreatic cancer   . H/O infertility   . H/O menorrhagia   . History of chicken pox   . History of measles, mumps, or rubella   . Hyperlipidemia   . Hypertension    . Low iron   . Menses, irregular   . 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   [name]     SURGICAL HISTORY: Past Surgical History:  Procedure Laterality Date  . BREAST LUMPECTOMY WITH RADIOACTIVE SEED AND SENTINEL LYMPH NODE BIOPSY Right 08/07/2018   Procedure: INJECT BLUE DYE RIGHT BREAST,RIGHT BREAST LUMPECTOMY WITH RADIOACTIVE SEED AND RIGHT AXILLARY DEEP SENTINEL LYMPH NODE BIOPSY;  Surgeon: Fanny Skates, MD;  Location: [facility];  Service: General;  Laterality: Right;  . EYE SURGERY     left eye at age 52  . head surgery     to cover hole in head at age of  11 yrs young  . PORTACATH PLACEMENT Left 08/07/2018   Procedure: INSERTION PORT-A-CATH;  Surgeon: Fanny Skates, MD;  Location: [facility];  Service: General;  Laterality: Left;  . WISDOM TOOTH EXTRACTION  2001    SOCIAL HISTORY: Social History   Socioeconomic History  . Marital status: Married    Spouse name: Not on file  . Number of children: Not on file  . Years of education: Not on file  . Highest education level: Not on file  Occupational History  . Not on file  Social Needs  . Financial resource strain: Not on file  . Food insecurity:    Worry: Not on file    Inability: Not on file  . Transportation needs:    Medical: Not on file    Non-medical: Not on file  Tobacco Use  . Smoking status: Never Smoker  . Smokeless tobacco: Never Used  Substance and Sexual Activity  . Alcohol use: Not Currently  . Drug use: Never  . Sexual activity: Yes  Lifestyle  . Physical activity:    Days per week: Not on file    Minutes per session: Not on file  . Stress: Not on file  Relationships  . Social connections:    Talks on phone: Not on file    Gets together: Not on file    Attends religious service: Not on file    Active member  of club or organization: Not on file    Attends meetings of clubs or organizations: Not on file    Relationship status: Not on file  . Intimate partner violence:    Fear of current or ex partner: Not on file    Emotionally abused: Not on file    Physically abused: Not on file    Forced sexual activity: Not on file  Other Topics Concern  . Not on file  Social History Narrative  . Not on file    FAMILY HISTORY: Family History  Problem Relation Age of Onset  . Hypertension Mother   . Kidney disease Mother   . Heart disease Father   . Hypertension Brother   . Kidney failure Maternal Grandfather   . Pancreatic cancer  Cousin        dx >50    Review of Systems  Constitutional: Negative for appetite change, chills, fatigue, fever and unexpected weight change.  HENT:   Negative for hearing loss, lump/mass and trouble swallowing.   Eyes: Negative for eye problems and icterus.  Respiratory: Negative for chest tightness, cough and shortness of breath.   Cardiovascular: Negative for chest pain, leg swelling and palpitations.  Gastrointestinal: Negative for abdominal distention, abdominal pain, blood in stool, constipation, diarrhea, nausea and vomiting.  Endocrine: Negative for hot flashes.  Skin: Negative for itching and rash.  Neurological: Negative for dizziness, extremity weakness, headaches and numbness.  Hematological: Negative for adenopathy. Does not bruise/bleed easily.  Psychiatric/Behavioral: Negative for depression. The patient is not nervous/anxious.       PHYSICAL EXAMINATION  ECOG PERFORMANCE STATUS: 1 - Symptomatic but completely ambulatory  Vitals:   12/13/18 0844  BP: 115/88  Pulse: 87  Resp: 18  Temp: 97.8 F (36.6 C)  SpO2: 99%    Physical Exam Constitutional:      Appearance: Normal appearance.  HENT:     Head: Normocephalic and atraumatic.     Mouth/Throat:     Mouth: Mucous membranes are moist.     Pharynx: Oropharynx is clear. No oropharyngeal  exudate.  Eyes:     General: No scleral icterus.    Pupils: Pupils are equal, round, and reactive to light.  Neck:     Musculoskeletal: Neck supple.  Cardiovascular:     Rate and Rhythm: Normal rate and regular rhythm.     Heart sounds: Normal heart sounds.  Pulmonary:     Effort: Pulmonary effort is normal.     Breath sounds: Normal breath sounds.     Comments: Right breast s/p lumpectomy, no sign of local recurrence, well healed, left breast fungal rash in inframammary fold much improved Abdominal:     General: Abdomen is flat. Bowel sounds are normal. There is no distension.     Palpations: Abdomen is soft. There is no mass.     Tenderness: There is no abdominal tenderness. There is no guarding or rebound.  Musculoskeletal:        General: No swelling.  Lymphadenopathy:     Cervical: No cervical adenopathy.  Skin:    General: Skin is warm and dry.     Capillary Refill: Capillary refill takes less than 2 seconds.     Comments: + continued facial rash, drying up and less erythematous  Neurological:     General: No focal deficit present.     Mental Status: She is alert.  Psychiatric:        Mood and Affect: Mood normal.        Behavior: Behavior normal.     LABORATORY DATA:  CBC    Component Value Date/Time   WBC 3.4 (L) 12/13/2018 0802   RBC 3.78 (L) 12/13/2018 0802   HGB 12.5 12/13/2018 0802   HCT 37.9 12/13/2018 0802   PLT 193 12/13/2018 0802   MCV 100.3 (H) 12/13/2018 0802   MCH 33.1 12/13/2018 0802   MCHC 33.0 12/13/2018 0802   RDW 14.5 12/13/2018 0802   LYMPHSABS 0.7 12/13/2018 0802   MONOABS 0.4 12/13/2018 0802   EOSABS 0.1 12/13/2018 0802   BASOSABS 0.0 12/13/2018 0802    CMP     Component Value Date/Time   NA 140 12/06/2018 0822   K 3.8 12/06/2018 0822   CL 108 12/06/2018 0822   CO2 26 12/06/2018 7106  GLUCOSE 102 (H) 12/06/2018 0822   BUN 10 12/06/2018 0822   CREATININE 0.81 12/06/2018 0822   CALCIUM 9.2 12/06/2018 0822   PROT 6.7 12/06/2018  0822   ALBUMIN 3.7 12/06/2018 0822   AST 22 12/06/2018 0822   ALT 33 12/06/2018 0822   ALKPHOS 71 12/06/2018 0822   BILITOT 0.3 12/06/2018 0822   GFRNONAA >60 12/06/2018 0822   GFRAA >60 12/06/2018 2867       PENDING LABS:   RADIOGRAPHIC STUDIES:  No results found.   PATHOLOGY:     ASSESSMENT and THERAPY PLAN:   Malignant neoplasm of upper-outer quadrant of right breast in female, estrogen receptor negative ([place]) 08/07/2018:Right lumpectomy: IDC grade 2, 1.5 cm, with intermediate grade DCIS, margins negative, 0/3 lymph nodes negative, triple negative with Ki-67 of 50%, T1 CN 0 stage IB Patient works at [employer] and can work from home if necessary.  Treatment plan: 1.Adjuvant chemotherapy with dose dense Adriamycin and Cytoxan x4 followed by Taxol weekly x12 2.Adjuvant radiation therapy  UPBEAT clinical trial ([id number]): No adverse effects being on the clinical trial _________________________________________________________________________________________ Current treatment: Taxol weekly   Chemo toxicities: 1. Facial rash: improving with metronidazole cream 2. Thrombocytopenia: platelets have normalized 3. Constipation: recommended stool softeners and Miralax, resolved at this point 4. Fungal rash under breast: Ketoconazole cream prescribed, improved   Return to clinic weekly for labs and Taxol, every other week for labs, f/u with myself or Dr. [name], and Taxol.      All questions were answered. The patient knows to call the clinic with any problems, questions or concerns. We can certainly see the patient much sooner if necessary.  A total of (20) minutes of face-to-face time was spent with this patient with greater than 50% of that time in counseling and care-coordination.  This note was electronically signed. Scot Dock, NP 12/13/2018

## 2018-12-13 NOTE — Patient Instructions (Signed)
[facility] Discharge Instructions for Patients Receiving Chemotherapy  Today you received the following chemotherapy agents:  Taxol.  To help prevent nausea and vomiting after your treatment, we encourage you to take your nausea medication as directed.   If you develop nausea and vomiting that is not controlled by your nausea medication, call the clinic.   BELOW ARE SYMPTOMS THAT SHOULD BE REPORTED IMMEDIATELY:  *FEVER GREATER THAN 100.5 F  *CHILLS WITH OR WITHOUT FEVER  NAUSEA AND VOMITING THAT IS NOT CONTROLLED WITH YOUR NAUSEA MEDICATION  *UNUSUAL SHORTNESS OF BREATH  *UNUSUAL BRUISING OR BLEEDING  TENDERNESS IN MOUTH AND THROAT WITH OR WITHOUT PRESENCE OF ULCERS  *URINARY PROBLEMS  *BOWEL PROBLEMS  UNUSUAL RASH Items with * indicate a potential emergency and should be followed up as soon as possible.  Feel free to call the clinic should you have any questions or concerns. The clinic phone number is [phone].  Please show the CHEMO ALERT CARD at check-in to the Emergency Department and triage nurse.   

## 2018-12-13 NOTE — Assessment & Plan Note (Signed)
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  Treatment plan: 1.Adjuvant chemotherapy with dose dense Adriamycin and Cytoxan x4 followed by Taxol weekly x12 2.Adjuvant radiation therapy  UPBEAT clinical trial ([id number]): No adverse effects being on the clinical trial _________________________________________________________________________________________ Current treatment: Taxol weekly   Chemo toxicities: 1. Facial rash: improving with metronidazole cream 2. Thrombocytopenia: platelets have normalized 3. Constipation: recommended stool softeners and Miralax, resolved at this point 4. Fungal rash under breast: Ketoconazole cream prescribed, improved   Return to clinic weekly for labs and Taxol, every other week for labs, f/u with myself or Dr. [name], and Taxol.

## 2018-12-14 ENCOUNTER — Telehealth: Payer: Self-pay | Admitting: Adult Health

## 2018-12-14 NOTE — Telephone Encounter (Signed)
Per 1/2 no los 

## 2018-12-18 DIAGNOSIS — D849 Immunodeficiency, unspecified: Secondary | ICD-10-CM | POA: Diagnosis not present

## 2018-12-18 DIAGNOSIS — I10 Essential (primary) hypertension: Secondary | ICD-10-CM | POA: Diagnosis not present

## 2018-12-18 DIAGNOSIS — E7800 Pure hypercholesterolemia, unspecified: Secondary | ICD-10-CM | POA: Diagnosis not present

## 2018-12-19 ENCOUNTER — Inpatient Hospital Stay: Payer: BLUE CROSS/BLUE SHIELD

## 2018-12-19 VITALS — BP 128/79 | HR 79 | Temp 97.7°F | Resp 20 | Wt 233.8 lb

## 2018-12-19 DIAGNOSIS — E785 Hyperlipidemia, unspecified: Secondary | ICD-10-CM | POA: Diagnosis not present

## 2018-12-19 DIAGNOSIS — Z79899 Other long term (current) drug therapy: Secondary | ICD-10-CM | POA: Diagnosis not present

## 2018-12-19 DIAGNOSIS — K5900 Constipation, unspecified: Secondary | ICD-10-CM | POA: Diagnosis not present

## 2018-12-19 DIAGNOSIS — Z5111 Encounter for antineoplastic chemotherapy: Secondary | ICD-10-CM | POA: Diagnosis not present

## 2018-12-19 DIAGNOSIS — I10 Essential (primary) hypertension: Secondary | ICD-10-CM | POA: Diagnosis not present

## 2018-12-19 DIAGNOSIS — C50411 Malignant neoplasm of upper-outer quadrant of right female breast: Secondary | ICD-10-CM

## 2018-12-19 DIAGNOSIS — R5383 Other fatigue: Secondary | ICD-10-CM | POA: Diagnosis not present

## 2018-12-19 DIAGNOSIS — Z171 Estrogen receptor negative status [ER-]: Secondary | ICD-10-CM

## 2018-12-19 DIAGNOSIS — D696 Thrombocytopenia, unspecified: Secondary | ICD-10-CM | POA: Diagnosis not present

## 2018-12-19 DIAGNOSIS — Z95828 Presence of other vascular implants and grafts: Secondary | ICD-10-CM

## 2018-12-19 DIAGNOSIS — Z800 Family history of malignant neoplasm of digestive organs: Secondary | ICD-10-CM | POA: Diagnosis not present

## 2018-12-19 DIAGNOSIS — B029 Zoster without complications: Secondary | ICD-10-CM | POA: Diagnosis not present

## 2018-12-19 DIAGNOSIS — R21 Rash and other nonspecific skin eruption: Secondary | ICD-10-CM | POA: Diagnosis not present

## 2018-12-19 DIAGNOSIS — Z9221 Personal history of antineoplastic chemotherapy: Secondary | ICD-10-CM | POA: Diagnosis not present

## 2018-12-19 DIAGNOSIS — Z923 Personal history of irradiation: Secondary | ICD-10-CM | POA: Diagnosis not present

## 2018-12-19 LAB — CMP (CANCER CENTER ONLY)
ALT: 32 U/L (ref 0–44)
AST: 22 U/L (ref 15–41)
Albumin: 3.8 g/dL (ref 3.5–5.0)
Alkaline Phosphatase: 69 U/L (ref 38–126)
Anion gap: 9 (ref 5–15)
BUN: 11 mg/dL (ref 6–20)
CO2: 25 mmol/L (ref 22–32)
CREATININE: 0.84 mg/dL (ref 0.44–1.00)
Calcium: 9.4 mg/dL (ref 8.9–10.3)
Chloride: 107 mmol/L (ref 98–111)
GFR, Est AFR Am: >60 mL/min (ref >60)
GFR, Estimated: >60 mL/min (ref >60)
Glucose, Bld: 104 mg/dL — ABNORMAL HIGH (ref 70–99)
Potassium: 3.9 mmol/L (ref 3.5–5.1)
Sodium: 141 mmol/L (ref 135–145)
Total Bilirubin: 0.5 mg/dL (ref 0.3–1.2)
Total Protein: 6.8 g/dL (ref 6.5–8.1)

## 2018-12-19 LAB — CBC WITH DIFFERENTIAL (CANCER CENTER ONLY)
Abs Immature Granulocytes: 0.01 10*3/uL (ref 0.00–0.07)
Basophils Absolute: 0 10*3/uL (ref 0.0–0.1)
Basophils Relative: 1 %
EOS PCT: 1 %
Eosinophils Absolute: 0 10*3/uL (ref 0.0–0.5)
HCT: 35.6 % — ABNORMAL LOW (ref 36.0–46.0)
Hemoglobin: 11.6 g/dL — ABNORMAL LOW (ref 12.0–15.0)
Immature Granulocytes: 0 %
Lymphocytes Relative: 23 %
Lymphs Abs: 0.6 10*3/uL — ABNORMAL LOW (ref 0.7–4.0)
MCH: 32.6 pg (ref 26.0–34.0)
MCHC: 32.6 g/dL (ref 30.0–36.0)
MCV: 100 fL (ref 80.0–100.0)
MONO ABS: 0.3 10*3/uL (ref 0.1–1.0)
Monocytes Relative: 9 %
NEUTROS ABS: 1.9 10*3/uL (ref 1.7–7.7)
Neutrophils Relative %: 66 %
Platelet Count: 193 10*3/uL (ref 150–400)
RBC: 3.56 MIL/uL — ABNORMAL LOW (ref 3.87–5.11)
RDW: 13.6 % (ref 11.5–15.5)
WBC Count: 2.9 10*3/uL — ABNORMAL LOW (ref 4.0–10.5)
nRBC: 0 % (ref 0.0–0.2)

## 2018-12-19 MED ORDER — SODIUM CHLORIDE 0.9% FLUSH
10.0000 mL | INTRAVENOUS | Status: DC | PRN
  Administered 2018-12-19: 10 mL
  Filled 2018-12-19: qty 10

## 2018-12-19 MED ORDER — SODIUM CHLORIDE 0.9 % IV SOLN
Freq: Once | INTRAVENOUS | Status: AC
  Administered 2018-12-19: 10:00:00 via INTRAVENOUS
  Filled 2018-12-19: qty 250

## 2018-12-19 MED ORDER — ALTEPLASE 2 MG IJ SOLR
2.0000 mg | Freq: Once | INTRAMUSCULAR | Status: AC | PRN
  Administered 2018-12-19: 2 mg
  Filled 2018-12-19: qty 2

## 2018-12-19 MED ORDER — SODIUM CHLORIDE 0.9 % IV SOLN
80.0000 mg/m2 | Freq: Once | INTRAVENOUS | Status: AC
  Administered 2018-12-19: 180 mg via INTRAVENOUS
  Filled 2018-12-19: qty 30

## 2018-12-19 MED ORDER — DEXAMETHASONE SODIUM PHOSPHATE 10 MG/ML IJ SOLN
10.0000 mg | Freq: Once | INTRAMUSCULAR | Status: AC
  Administered 2018-12-19: 10 mg via INTRAVENOUS

## 2018-12-19 MED ORDER — FAMOTIDINE IN NACL 20-0.9 MG/50ML-% IV SOLN
INTRAVENOUS | Status: AC
  Filled 2018-12-19: qty 50

## 2018-12-19 MED ORDER — DIPHENHYDRAMINE HCL 50 MG/ML IJ SOLN
25.0000 mg | Freq: Once | INTRAMUSCULAR | Status: AC
  Administered 2018-12-19: 25 mg via INTRAVENOUS

## 2018-12-19 MED ORDER — ALTEPLASE 2 MG IJ SOLR
INTRAMUSCULAR | Status: AC
  Filled 2018-12-19: qty 2

## 2018-12-19 MED ORDER — DEXAMETHASONE SODIUM PHOSPHATE 10 MG/ML IJ SOLN
INTRAMUSCULAR | Status: AC
  Filled 2018-12-19: qty 1

## 2018-12-19 MED ORDER — DIPHENHYDRAMINE HCL 50 MG/ML IJ SOLN
INTRAMUSCULAR | Status: AC
  Filled 2018-12-19: qty 1

## 2018-12-19 MED ORDER — FAMOTIDINE IN NACL 20-0.9 MG/50ML-% IV SOLN
20.0000 mg | Freq: Once | INTRAVENOUS | Status: AC
  Administered 2018-12-19: 20 mg via INTRAVENOUS

## 2018-12-19 MED ORDER — HEPARIN SOD (PORK) LOCK FLUSH 100 UNIT/ML IV SOLN
500.0000 [IU] | Freq: Once | INTRAVENOUS | Status: AC | PRN
  Administered 2018-12-19: 500 [IU]
  Filled 2018-12-19: qty 5

## 2018-12-19 NOTE — Patient Instructions (Signed)
[facility] Discharge Instructions for Patients Receiving Chemotherapy  Today you received the following chemotherapy agents Paclitaxel (TAXOL).  To help prevent nausea and vomiting after your treatment, we encourage you to take your nausea medication as prescribed.  If you develop nausea and vomiting that is not controlled by your nausea medication, call the clinic.   BELOW ARE SYMPTOMS THAT SHOULD BE REPORTED IMMEDIATELY:  *FEVER GREATER THAN 100.5 F  *CHILLS WITH OR WITHOUT FEVER  NAUSEA AND VOMITING THAT IS NOT CONTROLLED WITH YOUR NAUSEA MEDICATION  *UNUSUAL SHORTNESS OF BREATH  *UNUSUAL BRUISING OR BLEEDING  TENDERNESS IN MOUTH AND THROAT WITH OR WITHOUT PRESENCE OF ULCERS  *URINARY PROBLEMS  *BOWEL PROBLEMS  UNUSUAL RASH Items with * indicate a potential emergency and should be followed up as soon as possible.  Feel free to call the clinic should you have any questions or concerns. The clinic phone number is [phone].  Please show the CHEMO ALERT CARD at check-in to the Emergency Department and triage nurse.   

## 2018-12-19 NOTE — Progress Notes (Signed)
No blood return from port. Several attempts flushing and repositioning with no blood return. Charge nurse called and CATHFLOW will be administered. Lab will draw peripherally. Shya Kovatch LPN

## 2018-12-23 ENCOUNTER — Encounter: Payer: Self-pay | Admitting: Adult Health

## 2018-12-24 ENCOUNTER — Inpatient Hospital Stay (HOSPITAL_BASED_OUTPATIENT_CLINIC_OR_DEPARTMENT_OTHER): Payer: BLUE CROSS/BLUE SHIELD | Admitting: Medical

## 2018-12-24 ENCOUNTER — Encounter: Payer: Self-pay | Admitting: Adult Health

## 2018-12-24 VITALS — BP 121/87 | HR 60 | Temp 97.9°F | Resp 17 | Ht 66.5 in | Wt 232.9 lb

## 2018-12-24 DIAGNOSIS — Z5111 Encounter for antineoplastic chemotherapy: Secondary | ICD-10-CM

## 2018-12-24 DIAGNOSIS — Z800 Family history of malignant neoplasm of digestive organs: Secondary | ICD-10-CM

## 2018-12-24 DIAGNOSIS — Z923 Personal history of irradiation: Secondary | ICD-10-CM | POA: Diagnosis not present

## 2018-12-24 DIAGNOSIS — Z9221 Personal history of antineoplastic chemotherapy: Secondary | ICD-10-CM | POA: Diagnosis not present

## 2018-12-24 DIAGNOSIS — Z79899 Other long term (current) drug therapy: Secondary | ICD-10-CM | POA: Diagnosis not present

## 2018-12-24 DIAGNOSIS — R5383 Other fatigue: Secondary | ICD-10-CM | POA: Diagnosis not present

## 2018-12-24 DIAGNOSIS — Z171 Estrogen receptor negative status [ER-]: Secondary | ICD-10-CM | POA: Diagnosis not present

## 2018-12-24 DIAGNOSIS — B029 Zoster without complications: Secondary | ICD-10-CM | POA: Diagnosis not present

## 2018-12-24 DIAGNOSIS — I10 Essential (primary) hypertension: Secondary | ICD-10-CM

## 2018-12-24 DIAGNOSIS — C50411 Malignant neoplasm of upper-outer quadrant of right female breast: Secondary | ICD-10-CM | POA: Diagnosis not present

## 2018-12-24 DIAGNOSIS — D696 Thrombocytopenia, unspecified: Secondary | ICD-10-CM | POA: Diagnosis not present

## 2018-12-24 DIAGNOSIS — K5900 Constipation, unspecified: Secondary | ICD-10-CM

## 2018-12-24 DIAGNOSIS — E785 Hyperlipidemia, unspecified: Secondary | ICD-10-CM | POA: Diagnosis not present

## 2018-12-24 DIAGNOSIS — R21 Rash and other nonspecific skin eruption: Secondary | ICD-10-CM | POA: Diagnosis not present

## 2018-12-24 MED ORDER — GABAPENTIN 300 MG PO CAPS: 300.0000 mg | ORAL_CAPSULE | Freq: Every day | ORAL | 0 refills | Status: DC

## 2018-12-24 MED ORDER — PREDNISONE 5 MG PO TABS: ORAL_TABLET | ORAL | 0 refills | Status: DC

## 2018-12-24 MED ORDER — VALACYCLOVIR HCL 1 G PO TABS: 1000.0000 mg | ORAL_TABLET | Freq: Three times a day (TID) | ORAL | 0 refills | Status: DC

## 2018-12-24 NOTE — Progress Notes (Signed)
Pt seen by PA Van only, no RN assessment at this time.  PA aware. 

## 2018-12-24 NOTE — Patient Instructions (Signed)
Shingles    Shingles is an infection. It gives you a painful skin rash and blisters that have fluid in them. Shingles is caused by the same germ (virus) that causes chickenpox.  Shingles only happens in people who:   Have had chickenpox.   Have been given a shot of medicine (vaccine) to protect against chickenpox. Shingles is rare in this group.  The first symptoms of shingles may be itching, tingling, or pain in an area on your skin. A rash will show on your skin a few days or weeks later. The rash is likely to be on one side of your body. The rash usually has a shape like a belt or a band. Over time, the rash turns into fluid-filled blisters. The blisters will break open, change into scabs, and dry up. Medicines may:   Help with pain and itching.   Help you get better sooner.   Help to prevent long-term problems.  Follow these instructions at home:  Medicines   Take over-the-counter and prescription medicines only as told by your doctor.   Put on an anti-itch cream or numbing cream where you have a rash, blisters, or scabs. Do this as told by your doctor.  Helping with itching and discomfort     Put cold, wet cloths (cold compresses) on the area of the rash or blisters as told by your doctor.   Cool baths can help you feel better. Try adding baking soda or dry oatmeal to the water to lessen itching. Do not bathe in hot water.  Blister and rash care   Keep your rash covered with a loose bandage (dressing).   Wear loose clothing that does not rub on your rash.   Keep your rash and blisters clean. To do this, wash the area with mild soap and cool water as told by your doctor.   Check your rash every day for signs of infection. Check for:  ? More redness, swelling, or pain.  ? Fluid or blood.  ? Warmth.  ? Pus or a bad smell.   Do not scratch your rash. Do not pick at your blisters. To help you to not scratch:  ? Keep your fingernails clean and cut short.  ? Wear gloves or mittens when you sleep, if  scratching is a problem.  General instructions   Rest as told by your doctor.   Keep all follow-up visits as told by your doctor. This is important.   Wash your hands often with soap and water. If soap and water are not available, use hand sanitizer. Doing this lowers your chance of getting a skin infection caused by germs (bacteria).   Your infection can cause chickenpox in people who have never had chickenpox or never got a shot of chickenpox vaccine. If you have blisters that did not change into scabs yet, try not to touch other people or be around other people, especially:  ? Babies.  ? Pregnant women.  ? Children who have areas of red, itchy, or rough skin (eczema).  ? Very old people who have transplants.  ? People who have a long-term (chronic) sickness, like cancer or AIDS.  Contact a doctor if:   Your pain does not get better with medicine.   Your pain does not get better after the rash heals.   You have any signs of infection in the rash area. These signs include:  ? More redness, swelling, or pain around the rash.  ? Fluid or blood coming from   the rash.  ? The rash area feeling warm to the touch.  ? Pus or a bad smell coming from the rash.  Get help right away if:   The rash is on your face or nose.   You have pain in your face or pain by your eye.   You lose feeling on one side of your face.   You have trouble seeing.   You have ear pain, or you have ringing in your ear.   You have a loss of taste.   Your condition gets worse.  Summary   Shingles gives you a painful skin rash and blisters that have fluid in them.   Shingles is an infection. It is caused by the same germ (virus) that causes chickenpox.   Keep your rash covered with a loose bandage (dressing). Wear loose clothing that does not rub on your rash.   If you have blisters that did not change into scabs yet, try not to touch other people or be around people.  This information is not intended to replace advice given to you by  your health care provider. Make sure you discuss any questions you have with your health care provider.  Document Released: 05/16/2008 Document Revised: 08/02/2017 Document Reviewed: 08/02/2017  Elsevier Interactive Patient Education  2019 [employer]

## 2018-12-25 ENCOUNTER — Encounter: Payer: Self-pay | Admitting: Hematology and Oncology

## 2018-12-25 ENCOUNTER — Telehealth: Payer: Self-pay

## 2018-12-25 NOTE — Telephone Encounter (Signed)
Spoke with pt to update her that Dr.Gudena would like for her to hold treatment this week due to shingles. Will keep her appt for next week. Confirmed time/date. Added MD appt for next week prior to her infusion. Pt verbalized understanding and has no further needs at this time.

## 2018-12-26 ENCOUNTER — Inpatient Hospital Stay: Payer: BLUE CROSS/BLUE SHIELD

## 2018-12-26 ENCOUNTER — Inpatient Hospital Stay: Payer: BLUE CROSS/BLUE SHIELD | Admitting: Adult Health

## 2018-12-27 DIAGNOSIS — B029 Zoster without complications: Secondary | ICD-10-CM | POA: Insufficient documentation

## 2018-12-27 NOTE — Progress Notes (Signed)
[facility] Progress Note   Jennifer Carroll [id number] 04/23/1966 53 y.o.  Jennifer Carroll is managed by Dr. 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 Nicholas Lose.  The patient is scheduled to follow-up with Dr. [name] on 01/02/2019.  Herpetic zoster of the left forehead: The patient was treated with Valtrex 1000 mg p.o. 3 times daily x7 days.  She was also given a prescription for gabapentin 300 mg p.o. nightly x21 days and a 6-day prednisone taper.  These were given prophylactically for potential postherpetic neuralgia.  She was referred to her optometrist Dr. Einar Gip and will see him today.  Please see After Visit Summary for patient specific instructions.  Future Appointments  Date Time Provider [place]  01/02/2019  7:45 AM [facility] LAB 5 [facility] None  01/02/2019  8:00 AM [facility] [place] None  01/02/2019  8:15 AM Nicholas Lose, MD [facility] None  01/02/2019  8:30 AM [facility] INFUSION [facility] None  01/09/2019  8:30 AM [facility] ONLY [facility] None  01/09/2019  8:45 AM [facility] [place] FLUSH [facility] None  01/09/2019  9:15 AM Nicholas Lose, MD [facility] None  01/09/2019 10:00 AM [facility] INFUSION [facility] None  01/16/2019  7:45 AM [facility] LAB 3 [facility] None  01/16/2019  8:00 AM [facility] [place] FLUSH [facility] None  01/16/2019  8:30 AM [facility] INFUSION  [facility] None  01/23/2019  7:45 AM [facility] LAB 4 [facility] None  01/23/2019  8:00 AM [facility] [place] FLUSH [facility] None  01/23/2019  8:30 AM Cause[place], Charlestine Massed, NP [facility] None  01/23/2019  9:30 AM [facility] INFUSION [facility] None    No orders of the defined types were placed in this encounter.      Subjective:   Patient ID:  Jennifer Carroll is a 53 y.o. (DOB Feb 26, 1966) female.  Chief Complaint: No chief complaint on file.   HPI Jennifer Carroll is a 53 year old female with a diagnosis of an ER negative malignant neoplasm of the right breast: She is status post cycle 7, day 1 of paclitaxel which was dosed on 12/19/2026 under the direction of Dr. Nicholas Lose.  She has a history of a macular rash but presents to day with a several day history of a rash over her left forehead and scalp.  The rash extends to the bridge and possibly tip of her nose.  She reports that the rash is itching but does not burn.  She does not have any recent upper respiratory symptoms.  She denies visual changes.  Medications: I have reviewed the patient's current medications.  Allergies: No Known Allergies  Past Medical History:  Diagnosis Date  . AMA (advanced maternal age) multigravida 35+ 03/28/05   with no amniocentesis  . 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 Cancer [facility])    Right breast   . Family history of pancreatic cancer   . H/O infertility   . H/O menorrhagia   . History of chicken pox   . History of measles, mumps, or rubella   .  Hyperlipidemia   . Hypertension   . Low iron   . Menses, irregular   . 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   [name]     Past Surgical History:  Procedure Laterality Date  . BREAST LUMPECTOMY WITH RADIOACTIVE SEED AND SENTINEL LYMPH NODE BIOPSY Right 08/07/2018   Procedure: INJECT BLUE  DYE RIGHT BREAST,RIGHT BREAST LUMPECTOMY WITH RADIOACTIVE SEED AND RIGHT AXILLARY DEEP SENTINEL LYMPH NODE BIOPSY;  Surgeon: Fanny Skates, MD;  Location: [place];  Service: General;  Laterality: Right;  . EYE SURGERY     left eye at age 35  . head surgery     to cover hole in head at age of  49 yrs young  . PORTACATH PLACEMENT Left 08/07/2018   Procedure: INSERTION PORT-A-CATH;  Surgeon: Fanny Skates, MD;  Location: [place];  Service: General;  Laterality: Left;  . WISDOM TOOTH EXTRACTION  2001    Family History  Problem Relation Age of Onset  . Hypertension Mother   . Kidney disease Mother   . Heart disease Father   . Hypertension Brother   . Kidney failure Maternal Grandfather   . Pancreatic cancer Cousin        dx >50    Social History   Socioeconomic History  . Marital status: Married    Spouse name: Not on file  . Number of children: Not on file  . Years of education: Not on file  . Highest education level: Not on file  Occupational History  . Not on file  Social Needs  . Financial resource strain: Not on file  . Food insecurity:    Worry: Not on file    Inability: Not on file  . Transportation needs:    Medical: Not on file    Non-medical: Not on file  Tobacco Use  . Smoking status: Never Smoker  . Smokeless tobacco: Never Used  Substance and Sexual Activity  . Alcohol use: Not Currently  . Drug use: Never  . Sexual activity: Yes  Lifestyle  . Physical activity:    Days per week: Not on file    Minutes per session: Not on file  . Stress: Not on file  Relationships  . Social connections:    Talks on phone: Not on file    Gets together: Not on file    Attends religious service: Not on file    Active member of club or organization: Not on file    Attends meetings of clubs or organizations: Not on file    Relationship status: Not on file  . Intimate partner violence:    Fear of current or ex partner: Not on file     Emotionally abused: Not on file    Physically abused: Not on file    Forced sexual activity: Not on file  Other Topics Concern  . Not on file  Social History Narrative  . Not on file    Past Medical History, Surgical history, Social history, and Family history were reviewed and updated as appropriate.   Please see review of systems for further details on the patient's review from today.   Review of Systems:  Review of Systems  Constitutional: Negative for chills, diaphoresis and fever.  HENT: Negative for facial swelling and trouble swallowing.   Respiratory: Negative for cough, chest tightness and shortness of  breath.   Cardiovascular: Negative for chest pain.  Skin: Positive for rash (Pruritic rash of the left forehead, scalp, and bridge of the nose.).    Objective:   Physical Exam:  BP 121/87 (BP Location: Left Arm, Patient Position: Sitting)   Pulse 60   Temp 97.9 F (36.6 C) (Oral)   Resp 17   Ht 5' 6.5" (1.689 m)   Wt 232 lb 14.4 oz (105.6 kg)   SpO2 97%   BMI 37.03 kg/m  ECOG: 0  Physical Exam Constitutional:      General: She is not in acute distress.    Appearance: She is not diaphoretic.  HENT:     Head: Normocephalic and atraumatic.   Cardiovascular:     Rate and Rhythm: Normal rate and regular rhythm.     Heart sounds: Normal heart sounds. No murmur. No friction rub. No gallop.   Pulmonary:     Effort: Pulmonary effort is normal. No respiratory distress.     Breath sounds: Normal breath sounds. No wheezing or rales.  Skin:    General: Skin is warm and dry.     Findings: Erythema and rash present.  Neurological:     Mental Status: She is alert.     Lab Review:     Component Value Date/Time   NA 141 12/19/2018 0827   K 3.9 12/19/2018 0827   CL 107 12/19/2018 0827   CO2 25 12/19/2018 0827   GLUCOSE 104 (H) 12/19/2018 0827   BUN 11 12/19/2018 0827   CREATININE 0.84 12/19/2018 0827   CALCIUM 9.4 12/19/2018 0827   PROT 6.8 12/19/2018 0827    ALBUMIN 3.8 12/19/2018 0827   AST 22 12/19/2018 0827   ALT 32 12/19/2018 0827   ALKPHOS 69 12/19/2018 0827   BILITOT 0.5 12/19/2018 0827   GFRNONAA >60 12/19/2018 0827   GFRAA >60 12/19/2018 0827       Component Value Date/Time   WBC 2.9 (L) 12/19/2018 0827   RBC 3.56 (L) 12/19/2018 0827   HGB 11.6 (L) 12/19/2018 0827   HCT 35.6 (L) 12/19/2018 0827   PLT 193 12/19/2018 0827   MCV 100.0 12/19/2018 0827   MCH 32.6 12/19/2018 0827   MCHC 32.6 12/19/2018 0827   RDW 13.6 12/19/2018 0827   LYMPHSABS 0.6 (L) 12/19/2018 0827   MONOABS 0.3 12/19/2018 0827   EOSABS 0.0 12/19/2018 0827   BASOSABS 0.0 12/19/2018 0827   -------------------------------  Imaging from last 24 hours (if applicable):  Radiology interpretation: No results found.

## 2019-01-01 NOTE — Progress Notes (Signed)
Patient Care Team: [name], MD as PCP - General (Family Medicine) Fanny Skates, MD as Consulting Physician (General Surgery) Nicholas Lose, MD as Consulting Physician (Hematology and Oncology) Kyung Rudd, MD as Consulting Physician (Radiation Oncology)  DIAGNOSIS:    ICD-10-CM   1. Malignant neoplasm of upper-outer quadrant of right breast in female, estrogen receptor negative ([name]) C50.411    Z17.1     SUMMARY OF ONCOLOGIC HISTORY:   Malignant neoplasm of upper-outer quadrant of right breast in female, estrogen receptor negative (Jennifer Carroll)   07/13/2018 Initial Diagnosis    Screening detected right breast asymmetry UOQ 10 o'clock position 1.4 cm, axilla negative, biopsy revealed grade 2 IDC triple negative with a Ki-67 of 50% T1c N0 stage Ib AJCC 8    08/06/2018 Genetic Testing    NBN c.2117C>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  The Hereditary Gene Panel offered by [facility] includes sequencing and/or deletion duplication testing of the following 47 genes: APC, ATM, AXIN2, BARD1, BMPR1A, BRCA1, BRCA2, BRIP1, CDH1, CDK4, CDKN2A (p14ARF), CDKN2A (p16INK4a), CHEK2, CTNNA1, DICER1, EPCAM (Deletion/duplication testing only), GREM1 (promoter region deletion/duplication testing only), KIT, MEN1, MLH1, MSH2, MSH3, MSH6, MUTYH, NBN, NF1, NHTL1, PALB2, PDGFRA, PMS2, POLD1, POLE, PTEN, RAD50, RAD51C, RAD51D, SDHB, SDHC, SDHD, SMAD4, SMARCA4. STK11, TP53, TSC1, TSC2, and VHL.  The following genes were evaluated for sequence changes only: SDHA and HOXB13 c.251G>A variant only. The report date is August 06, 2018.    08/07/2018 Surgery    Right lumpectomy: IDC grade 2, 1.5 cm, with intermediate grade DCIS, margins negative, 0/3 lymph nodes negative, triple negative with Ki-67 of 50%, T1 CN 0 stage IB    08/07/2018 Cancer Staging    Staging form: Breast, AJCC 8th Edition - Pathologic stage from 08/07/2018: Stage IB (pT1c,  pN0, cM0, G2, ER-, PR-, HER2-) - Signed by Gardenia Phlegm, NP on 08/22/2018    09/05/2018 -  Chemotherapy    Adriamycin and Cytoxan x 4 to be followed by weekly Taxol    11/07/2018 -  Chemotherapy    The patient had PACLitaxel (TAXOL) 180 mg in sodium chloride 0.9 % 250 mL chemo infusion (</= 31m/m2), 80 mg/m2 = 180 mg, Intravenous,  Once, 7 of 12 cycles Administration: 180 mg (11/07/2018), 180 mg (11/14/2018), 180 mg (11/21/2018), 180 mg (11/28/2018), 180 mg (12/06/2018), 180 mg (12/13/2018), 180 mg (12/19/2018)  for chemotherapy treatment.      CHIEF COMPLIANT: Cycle 8 of Taxol  INTERVAL HISTORY: Jennifer Carroll a 53y.o. with above-mentioned history of right breast cancer treat with lumpectomy and is currently on adjuvant chemotherapy with weekly Taxol. She presents to the clinic today for Cycle 8 of Taxol. She had shingles on her face and head after her last treatment that came on quickly and has since resolved but caused her to miss last week's treatment. She denies any tingling or numbness in her hands and feet. She denies nausea, diarrhea, or constipation. Her appetite is normal. She reviewed her medication list with me. Her labs from today show WBC 3.7, Hg 12.9, neutrophils 2.1.  REVIEW OF SYSTEMS:   Constitutional: Denies fevers, chills or abnormal weight loss Eyes: Denies blurriness of vision Ears, nose, mouth, throat, and face: Denies mucositis or sore throat Respiratory: Denies cough, dyspnea or wheezes Cardiovascular: Denies palpitation, chest discomfort Gastrointestinal:  Denies nausea, heartburn or change in bowel habits Skin: (+) shingles, resolved Lymphatics: Denies new lymphadenopathy or easy bruising Neurological: Denies numbness, tingling or  new weaknesses Behavioral/Psych: Mood is stable, no new changes  Extremities: No lower extremity edema Breast: denies any pain or lumps or nodules in either breasts All other systems were reviewed with the patient and are  negative.  I have reviewed the past medical history, past surgical history, social history and family history with the patient and they are unchanged from previous note.  ALLERGIES:  has No Known Allergies.  MEDICATIONS:  Current Outpatient Medications  Medication Sig Dispense Refill  . gabapentin (NEURONTIN) 300 MG capsule Take 1 capsule (300 mg total) by mouth at bedtime. 21 capsule 0  . HYDROcodone-acetaminophen (NORCO) 5-325 MG tablet Take 1-2 tablets by mouth every 6 (six) hours as needed for moderate pain or severe pain. 20 tablet 0  . ibuprofen (ADVIL,MOTRIN) 200 MG tablet Take 200 mg by mouth every 6 (six) hours as needed. Ibuprofen 200 mg 2 tablets As needed.    Marland Kitchen ketoconazole (NIZORAL) 2 % cream Apply 1 application topically daily. 15 g 0  . LOSARTAN POTASSIUM PO Take 50 mg by mouth daily.    . metroNIDAZOLE (METROGEL) 1 % gel Apply topically daily. 45 g 0  . Multiple Vitamin (MULTI-VITAMIN DAILY PO) Take by mouth.    . norethindrone (CAMILA) 0.35 MG tablet Take 1 tablet (0.35 mg total) by mouth daily. 3 Package 3  . nystatin (MYCOSTATIN/NYSTOP) powder Apply topically 4 (four) times daily. On affected area. (Breast rash and toes ) 30 g 1  . predniSONE (DELTASONE) 5 MG tablet 6 tab x 1 day, 5 tab x 1 day, 4 tab x 1 day, 3 tab x 1 day, 2 tab x 1 day, 1 tab x 1 day, stop 21 tablet 0  . simvastatin (ZOCOR) 20 MG tablet Take 20 mg by mouth every evening.    . triamcinolone (KENALOG) 0.025 % ointment Apply 1 application topically 2 (two) times daily. 30 g 0  . valACYclovir (VALTREX) 1000 MG tablet Take 1 tablet (1,000 mg total) by mouth 3 (three) times daily. 21 tablet 0   No current facility-administered medications for this visit.    Facility-Administered Medications Ordered in Other Visits  Medication Dose Route Frequency Provider Last Rate Last Dose  . sodium chloride flush (NS) 0.9 % injection 10 mL  10 mL Intracatheter PRN Nicholas Lose, MD   10 mL at 01/02/19 0808    PHYSICAL  EXAMINATION: ECOG PERFORMANCE STATUS: 1 - Symptomatic but completely ambulatory  There were no vitals filed for this visit. There were no vitals filed for this visit.  GENERAL: alert, no distress and comfortable SKIN: skin color, texture, turgor are normal, no rashes or significant lesions EYES: normal, Conjunctiva are pink and non-injected, sclera clear OROPHARYNX: no exudate, no erythema and lips, buccal mucosa, and tongue normal  NECK: supple, thyroid normal size, non-tender, without nodularity LYMPH: no palpable lymphadenopathy in the cervical, axillary or inguinal LUNGS: clear to auscultation and percussion with normal breathing effort HEART: regular rate & rhythm and no murmurs and no lower extremity edema ABDOMEN: abdomen soft, non-tender and normal bowel sounds MUSCULOSKELETAL: no cyanosis of digits and no clubbing  NEURO: alert & oriented x 3 with fluent speech, no focal motor/sensory deficits EXTREMITIES: No lower extremity edema  LABORATORY DATA:  I have reviewed the data as listed CMP Latest Ref Rng & Units 12/19/2018 12/13/2018 12/06/2018  Glucose 70 - 99 mg/dL 104(H) 114(H) 102(H)  BUN 6 - 20 mg/dL 11 16 10   Creatinine 0.44 - 1.00 mg/dL 0.84 0.88 0.81  Sodium 135 -  145 mmol/L 141 141 140  Potassium 3.5 - 5.1 mmol/L 3.9 3.7 3.8  Chloride 98 - 111 mmol/L 107 108 108  CO2 22 - 32 mmol/L 25 24 26   Calcium 8.9 - 10.3 mg/dL 9.4 9.6 9.2  Total Protein 6.5 - 8.1 g/dL 6.8 7.0 6.7  Total Bilirubin 0.3 - 1.2 mg/dL 0.5 0.3 0.3  Alkaline Phos 38 - 126 U/L 69 74 71  AST 15 - 41 U/L 22 26 22   ALT 0 - 44 U/L 32 47(H) 33    Lab Results  Component Value Date   WBC 2.9 (L) 12/19/2018   HGB 11.6 (L) 12/19/2018   HCT 35.6 (L) 12/19/2018   MCV 100.0 12/19/2018   PLT 193 12/19/2018   NEUTROABS 1.9 12/19/2018    ASSESSMENT & PLAN:  Malignant neoplasm of upper-outer quadrant of right breast in female, estrogen receptor negative ([place]) 08/07/2018:Right lumpectomy: IDC grade 2, 1.5 cm,  with intermediate grade DCIS, margins negative, 0/3 lymph nodes negative, triple negative with Ki-67 of 50%, T1 CN 0 stage IB Patient works at [employer] and can work from home if necessary.  Treatment plan: 1.Adjuvant chemotherapy with dose dense Adriamycin and Cytoxan x4 followed by Taxol weekly x12 2.Adjuvant radiation therapy  UPBEAT clinical trial ([id number]): No adverse effects being on the clinical trial _________________________________________________________________________________________ Current treatment: Taxol weekly, today's cycle 8  Chemo toxicities: 1. Facial rash: improved with metronidazole cream 2. Thrombocytopenia: platelets have normalized 3. Constipation:  stool softeners and Miralax, resolved at this point 4. Fungal rash under breast: Ketoconazole cream prescribed, improved  No evidence of neuropathy Return to clinic weekly for labs and Taxol, every other week for labs and f/u       No orders of the defined types were placed in this encounter.  The patient has a good understanding of the overall plan. she agrees with it. she will call with any problems that may develop before the next visit here.  Nicholas Lose, MD 01/02/2019  Julious Oka Dorshimer am acting as scribe for Dr. Nicholas Lose.  I have reviewed the above documentation for accuracy and completeness, and I agree with the above.

## 2019-01-02 ENCOUNTER — Inpatient Hospital Stay: Payer: BLUE CROSS/BLUE SHIELD

## 2019-01-02 ENCOUNTER — Inpatient Hospital Stay (HOSPITAL_BASED_OUTPATIENT_CLINIC_OR_DEPARTMENT_OTHER): Payer: BLUE CROSS/BLUE SHIELD | Admitting: Hematology and Oncology

## 2019-01-02 ENCOUNTER — Encounter: Payer: Self-pay | Admitting: *Deleted

## 2019-01-02 ENCOUNTER — Encounter: Payer: Self-pay | Admitting: Hematology and Oncology

## 2019-01-02 DIAGNOSIS — C50411 Malignant neoplasm of upper-outer quadrant of right female breast: Secondary | ICD-10-CM

## 2019-01-02 DIAGNOSIS — Z95828 Presence of other vascular implants and grafts: Secondary | ICD-10-CM

## 2019-01-02 DIAGNOSIS — Z9221 Personal history of antineoplastic chemotherapy: Secondary | ICD-10-CM

## 2019-01-02 DIAGNOSIS — R21 Rash and other nonspecific skin eruption: Secondary | ICD-10-CM

## 2019-01-02 DIAGNOSIS — Z171 Estrogen receptor negative status [ER-]: Principal | ICD-10-CM

## 2019-01-02 DIAGNOSIS — I10 Essential (primary) hypertension: Secondary | ICD-10-CM

## 2019-01-02 DIAGNOSIS — K5900 Constipation, unspecified: Secondary | ICD-10-CM | POA: Diagnosis not present

## 2019-01-02 DIAGNOSIS — Z5111 Encounter for antineoplastic chemotherapy: Secondary | ICD-10-CM | POA: Diagnosis not present

## 2019-01-02 DIAGNOSIS — Z923 Personal history of irradiation: Secondary | ICD-10-CM

## 2019-01-02 DIAGNOSIS — Z79899 Other long term (current) drug therapy: Secondary | ICD-10-CM | POA: Diagnosis not present

## 2019-01-02 DIAGNOSIS — R5383 Other fatigue: Secondary | ICD-10-CM | POA: Diagnosis not present

## 2019-01-02 DIAGNOSIS — D696 Thrombocytopenia, unspecified: Secondary | ICD-10-CM | POA: Diagnosis not present

## 2019-01-02 DIAGNOSIS — E785 Hyperlipidemia, unspecified: Secondary | ICD-10-CM | POA: Diagnosis not present

## 2019-01-02 DIAGNOSIS — Z800 Family history of malignant neoplasm of digestive organs: Secondary | ICD-10-CM

## 2019-01-02 DIAGNOSIS — B029 Zoster without complications: Secondary | ICD-10-CM | POA: Diagnosis not present

## 2019-01-02 LAB — CBC WITH DIFFERENTIAL (CANCER CENTER ONLY)
Abs Immature Granulocytes: 0.01 10*3/uL (ref 0.00–0.07)
Basophils Absolute: 0 10*3/uL (ref 0.0–0.1)
Basophils Relative: 1 %
EOS ABS: 0.1 10*3/uL (ref 0.0–0.5)
Eosinophils Relative: 3 %
HCT: 38.8 % (ref 36.0–46.0)
Hemoglobin: 12.9 g/dL (ref 12.0–15.0)
Immature Granulocytes: 0 %
Lymphocytes Relative: 24 %
Lymphs Abs: 0.9 10*3/uL (ref 0.7–4.0)
MCH: 33.3 pg (ref 26.0–34.0)
MCHC: 33.2 g/dL (ref 30.0–36.0)
MCV: 100.3 fL — ABNORMAL HIGH (ref 80.0–100.0)
Monocytes Absolute: 0.6 10*3/uL (ref 0.1–1.0)
Monocytes Relative: 16 %
Neutro Abs: 2.1 10*3/uL (ref 1.7–7.7)
Neutrophils Relative %: 56 %
Platelet Count: 189 10*3/uL (ref 150–400)
RBC: 3.87 MIL/uL (ref 3.87–5.11)
RDW: 13.4 % (ref 11.5–15.5)
WBC Count: 3.7 10*3/uL — ABNORMAL LOW (ref 4.0–10.5)
nRBC: 0 % (ref 0.0–0.2)

## 2019-01-02 LAB — CMP (CANCER CENTER ONLY)
ALK PHOS: 73 U/L (ref 38–126)
ALT: 23 U/L (ref 0–44)
AST: 19 U/L (ref 15–41)
Albumin: 3.7 g/dL (ref 3.5–5.0)
Anion gap: 9 (ref 5–15)
BUN: 14 mg/dL (ref 6–20)
CALCIUM: 9.4 mg/dL (ref 8.9–10.3)
CO2: 25 mmol/L (ref 22–32)
Chloride: 107 mmol/L (ref 98–111)
Creatinine: 0.77 mg/dL (ref 0.44–1.00)
GFR, Estimated: >60 mL/min (ref >60)
Glucose, Bld: 92 mg/dL (ref 70–99)
Potassium: 4 mmol/L (ref 3.5–5.1)
Sodium: 141 mmol/L (ref 135–145)
Total Bilirubin: 0.6 mg/dL (ref 0.3–1.2)
Total Protein: 6.8 g/dL (ref 6.5–8.1)

## 2019-01-02 MED ORDER — DEXAMETHASONE SODIUM PHOSPHATE 10 MG/ML IJ SOLN
INTRAMUSCULAR | Status: AC
  Filled 2019-01-02: qty 1

## 2019-01-02 MED ORDER — DIPHENHYDRAMINE HCL 50 MG/ML IJ SOLN
25.0000 mg | Freq: Once | INTRAMUSCULAR | Status: AC
  Administered 2019-01-02: 25 mg via INTRAVENOUS

## 2019-01-02 MED ORDER — FAMOTIDINE IN NACL 20-0.9 MG/50ML-% IV SOLN
INTRAVENOUS | Status: AC
  Filled 2019-01-02: qty 50

## 2019-01-02 MED ORDER — SODIUM CHLORIDE 0.9 % IV SOLN
80.0000 mg/m2 | Freq: Once | INTRAVENOUS | Status: AC
  Administered 2019-01-02: 180 mg via INTRAVENOUS
  Filled 2019-01-02: qty 30

## 2019-01-02 MED ORDER — SODIUM CHLORIDE 0.9% FLUSH
10.0000 mL | INTRAVENOUS | Status: DC | PRN
  Administered 2019-01-02: 10 mL
  Filled 2019-01-02: qty 10

## 2019-01-02 MED ORDER — FAMOTIDINE IN NACL 20-0.9 MG/50ML-% IV SOLN
20.0000 mg | Freq: Once | INTRAVENOUS | Status: AC
  Administered 2019-01-02: 20 mg via INTRAVENOUS

## 2019-01-02 MED ORDER — DIPHENHYDRAMINE HCL 50 MG/ML IJ SOLN
INTRAMUSCULAR | Status: AC
  Filled 2019-01-02: qty 1

## 2019-01-02 MED ORDER — HEPARIN SOD (PORK) LOCK FLUSH 100 UNIT/ML IV SOLN
500.0000 [IU] | Freq: Once | INTRAVENOUS | Status: AC | PRN
  Administered 2019-01-02: 500 [IU]
  Filled 2019-01-02: qty 5

## 2019-01-02 MED ORDER — SODIUM CHLORIDE 0.9 % IV SOLN
Freq: Once | INTRAVENOUS | Status: AC
  Administered 2019-01-02: 09:00:00 via INTRAVENOUS
  Filled 2019-01-02: qty 250

## 2019-01-02 MED ORDER — DEXAMETHASONE SODIUM PHOSPHATE 10 MG/ML IJ SOLN
10.0000 mg | Freq: Once | INTRAMUSCULAR | Status: AC
  Administered 2019-01-02: 10 mg via INTRAVENOUS

## 2019-01-02 NOTE — Assessment & Plan Note (Signed)
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  Treatment plan: 1.Adjuvant chemotherapy with dose dense Adriamycin and Cytoxan x4 followed by Taxol weekly x12 2.Adjuvant radiation therapy  UPBEAT clinical trial ([id number]): No adverse effects being on the clinical trial _________________________________________________________________________________________ Current treatment: Taxol weekly, today's cycle 8  Chemo toxicities: 1. Facial rash: improved with metronidazole cream 2. Thrombocytopenia: platelets have normalized 3. Constipation:  stool softeners and Miralax, resolved at this point 4. Fungal rash under breast: Ketoconazole cream prescribed, improved   Return to clinic weekly for labs and Taxol, every other week for labs and f/u

## 2019-01-02 NOTE — Patient Instructions (Signed)
[facility] Discharge Instructions for Patients Receiving Chemotherapy  Today you received the following chemotherapy agents Paclitaxel (TAXOL).  To help prevent nausea and vomiting after your treatment, we encourage you to take your nausea medication as prescribed.  If you develop nausea and vomiting that is not controlled by your nausea medication, call the clinic.   BELOW ARE SYMPTOMS THAT SHOULD BE REPORTED IMMEDIATELY:  *FEVER GREATER THAN 100.5 F  *CHILLS WITH OR WITHOUT FEVER  NAUSEA AND VOMITING THAT IS NOT CONTROLLED WITH YOUR NAUSEA MEDICATION  *UNUSUAL SHORTNESS OF BREATH  *UNUSUAL BRUISING OR BLEEDING  TENDERNESS IN MOUTH AND THROAT WITH OR WITHOUT PRESENCE OF ULCERS  *URINARY PROBLEMS  *BOWEL PROBLEMS  UNUSUAL RASH Items with * indicate a potential emergency and should be followed up as soon as possible.  Feel free to call the clinic should you have any questions or concerns. The clinic phone number is [phone].  Please show the CHEMO ALERT CARD at check-in to the Emergency Department and triage nurse.   

## 2019-01-08 NOTE — Progress Notes (Signed)
Patient Care Team: [name], MD as PCP - General (Family Medicine) Fanny Skates, MD as Consulting Physician (General Surgery) Nicholas Lose, MD as Consulting Physician (Hematology and Oncology) Kyung Rudd, MD as Consulting Physician (Radiation Oncology)  DIAGNOSIS:    ICD-10-CM   1. Malignant neoplasm of upper-outer quadrant of right breast in female, estrogen receptor negative ([place]) C50.411    Z17.1     SUMMARY OF ONCOLOGIC HISTORY:   Malignant neoplasm of upper-outer quadrant of right breast in female, estrogen receptor negative ([place])   07/13/2018 Initial Diagnosis    Screening detected right breast asymmetry UOQ 10 o'clock position 1.4 cm, axilla negative, biopsy revealed grade 2 IDC triple negative with a Ki-67 of 50% T1c N0 stage Ib AJCC 8    08/06/2018 Genetic Testing    NBN c.2117C>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  The Hereditary Gene Panel offered by [facility] includes sequencing and/or deletion duplication testing of the following 47 genes: APC, ATM, AXIN2, BARD1, BMPR1A, BRCA1, BRCA2, BRIP1, CDH1, CDK4, CDKN2A (p14ARF), CDKN2A (p16INK4a), CHEK2, CTNNA1, DICER1, EPCAM (Deletion/duplication testing only), GREM1 (promoter region deletion/duplication testing only), KIT, MEN1, MLH1, MSH2, MSH3, MSH6, MUTYH, NBN, NF1, NHTL1, PALB2, PDGFRA, PMS2, POLD1, POLE, PTEN, RAD50, RAD51C, RAD51D, SDHB, SDHC, SDHD, SMAD4, SMARCA4. STK11, TP53, TSC1, TSC2, and VHL.  The following genes were evaluated for sequence changes only: SDHA and HOXB13 c.251G>A variant only. The report date is August 06, 2018.    08/07/2018 Surgery    Right lumpectomy: IDC grade 2, 1.5 cm, with intermediate grade DCIS, margins negative, 0/3 lymph nodes negative, triple negative with Ki-67 of 50%, T1 CN 0 stage IB    08/07/2018 Cancer Staging    Staging form: Breast, AJCC 8th Edition - Pathologic stage from 08/07/2018: Stage IB (pT1c,  pN0, cM0, G2, ER-, PR-, HER2-) - Signed by Gardenia Phlegm, NP on 08/22/2018    09/05/2018 -  Chemotherapy    Adriamycin and Cytoxan x 4 to be followed by weekly Taxol    11/07/2018 -  Chemotherapy    The patient had PACLitaxel (TAXOL) 180 mg in sodium chloride 0.9 % 250 mL chemo infusion (</= 46m/m2), 80 mg/m2 = 180 mg, Intravenous,  Once, 8 of 12 cycles Administration: 180 mg (11/07/2018), 180 mg (11/14/2018), 180 mg (11/21/2018), 180 mg (11/28/2018), 180 mg (12/06/2018), 180 mg (12/13/2018), 180 mg (12/19/2018), 180 mg (01/02/2019)  for chemotherapy treatment.      CHIEF COMPLIANT: Cycle 9 Taxol  INTERVAL HISTORY: Jennifer Carroll a 53y.o. with above-mentioned history of right breast cancer treat with lumpectomy and is currently on adjuvant chemotherapy with weekly Taxol. She presents to the clinic today for Cycle 9 of Taxol. She is tolerating treatment well but notes darkening of her fingernails. She denies any numbness of tingling in her hands or feet. She denies mouth sores. Her facial rash and constipation have resolved. Her labs from today show WBC 3.0, ANC 1.9, Hg 12.2.   REVIEW OF SYSTEMS:   Constitutional: Denies fevers, chills or abnormal weight loss (+) darkening of fingernails  Eyes: Denies blurriness of vision Ears, nose, mouth, throat, and face: Denies mucositis or sore throat Respiratory: Denies cough, dyspnea or wheezes Cardiovascular: Denies palpitation, chest discomfort Gastrointestinal:  Denies nausea, heartburn or change in bowel habits Skin: Denies abnormal skin rashes Lymphatics: Denies new lymphadenopathy or easy bruising Neurological: Denies numbness, tingling or new weaknesses Behavioral/Psych: Mood is stable, no new changes  Extremities: No lower extremity  edema Breast: denies any pain or lumps or nodules in either breasts All other systems were reviewed with the patient and are negative.  I have reviewed the past medical history, past surgical history,  social history and family history with the patient and they are unchanged from previous note.  ALLERGIES:  has No Known Allergies.  MEDICATIONS:  Current Outpatient Medications  Medication Sig Dispense Refill  . gabapentin (NEURONTIN) 300 MG capsule Take 1 capsule (300 mg total) by mouth at bedtime. 21 capsule 0  . ibuprofen (ADVIL,MOTRIN) 200 MG tablet Take 200 mg by mouth every 6 (six) hours as needed. Ibuprofen 200 mg 2 tablets As needed.    Marland Kitchen ketoconazole (NIZORAL) 2 % cream Apply 1 application topically daily. 15 g 0  . LOSARTAN POTASSIUM PO Take 50 mg by mouth daily.    . metroNIDAZOLE (METROGEL) 1 % gel Apply topically daily. 45 g 0  . Multiple Vitamin (MULTI-VITAMIN DAILY PO) Take by mouth.    . norethindrone (CAMILA) 0.35 MG tablet Take 1 tablet (0.35 mg total) by mouth daily. 3 Package 3  . nystatin (MYCOSTATIN/NYSTOP) powder Apply topically 4 (four) times daily. On affected area. (Breast rash and toes ) 30 g 1  . simvastatin (ZOCOR) 20 MG tablet Take 20 mg by mouth every evening.    . triamcinolone (KENALOG) 0.025 % ointment Apply 1 application topically 2 (two) times daily. 30 g 0   No current facility-administered medications for this visit.    Facility-Administered Medications Ordered in Other Visits  Medication Dose Route Frequency Provider Last Rate Last Dose  . sodium chloride flush (NS) 0.9 % injection 10 mL  10 mL Intracatheter PRN Nicholas Lose, MD   10 mL at 01/09/19 0856    PHYSICAL EXAMINATION: ECOG PERFORMANCE STATUS: 1 - Symptomatic but completely ambulatory  Vitals:   01/09/19 0915  BP: 112/82  Pulse: 83  Resp: 18  Temp: 98.1 F (36.7 C)  SpO2: 99%   Filed Weights   01/09/19 0915  Weight: 230 lb 14.4 oz (104.7 kg)    GENERAL: alert, no distress and comfortable SKIN: skin color, texture, turgor are normal, no rashes or significant lesions EYES: normal, Conjunctiva are pink and non-injected, sclera clear OROPHARYNX: no exudate, no erythema and  lips, buccal mucosa, and tongue normal  NECK: supple, thyroid normal size, non-tender, without nodularity LYMPH: no palpable lymphadenopathy in the cervical, axillary or inguinal LUNGS: clear to auscultation and percussion with normal breathing effort HEART: regular rate & rhythm and no murmurs and no lower extremity edema ABDOMEN: abdomen soft, non-tender and normal bowel sounds MUSCULOSKELETAL: no cyanosis of digits and no clubbing  NEURO: alert & oriented x 3 with fluent speech, no focal motor/sensory deficits EXTREMITIES: No lower extremity edema  LABORATORY DATA:  I have reviewed the data as listed CMP Latest Ref Rng & Units 01/02/2019 12/19/2018 12/13/2018  Glucose 70 - 99 mg/dL 92 104(H) 114(H)  BUN 6 - 20 mg/dL 14 11 16   Creatinine 0.44 - 1.00 mg/dL 0.77 0.84 0.88  Sodium 135 - 145 mmol/L 141 141 141  Potassium 3.5 - 5.1 mmol/L 4.0 3.9 3.7  Chloride 98 - 111 mmol/L 107 107 108  CO2 22 - 32 mmol/L 25 25 24   Calcium 8.9 - 10.3 mg/dL 9.4 9.4 9.6  Total Protein 6.5 - 8.1 g/dL 6.8 6.8 7.0  Total Bilirubin 0.3 - 1.2 mg/dL 0.6 0.5 0.3  Alkaline Phos 38 - 126 U/L 73 69 74  AST 15 - 41 U/L 19  22 26  ALT 0 - 44 U/L 23 32 47(H)    Lab Results  Component Value Date   WBC 3.0 (L) 01/09/2019   HGB 12.2 01/09/2019   HCT 36.1 01/09/2019   MCV 99.7 01/09/2019   PLT 174 01/09/2019   NEUTROABS 1.9 01/09/2019    ASSESSMENT & PLAN:  Malignant neoplasm of upper-outer quadrant of right breast in female, estrogen receptor negative ([place]) 08/07/2018:Right lumpectomy: IDC grade 2, 1.5 cm, with intermediate grade DCIS, margins negative, 0/3 lymph nodes negative, triple negative with Ki-67 of 50%, T1 CN 0 stage IB Patient works at [employer] and can work from home if necessary.  Treatment plan: 1.Adjuvant chemotherapy with dose dense Adriamycin and Cytoxan x4 followed by Taxol weekly x12 2.Adjuvant radiation therapy  UPBEAT clinical trial ([id number]): No adverse effects being on the  clinical trial _________________________________________________________________________________________ Current treatment: Taxol weekly, today's cycle 9  Chemo toxicities: 1.  Leukopenia: WBC 3.0 ANC 1.9 today.  We will continue to watch and monitor.  No evidence of neuropathy Return to clinic weekly for labs and Taxol, every other week for labs and f/u  She would like to see radiation oncology on the last day of her chemotherapy so that they can plan her radiation treatment subsequently.   No orders of the defined types were placed in this encounter.  The patient has a good understanding of the overall plan. she agrees with it. she will call with any problems that may develop before the next visit here.  Nicholas Lose, MD 01/09/2019  Jennifer Carroll am acting as scribe for Dr. Nicholas Lose.  I have reviewed the above documentation for accuracy and completeness, and I agree with the above.

## 2019-01-09 ENCOUNTER — Inpatient Hospital Stay: Payer: BLUE CROSS/BLUE SHIELD

## 2019-01-09 ENCOUNTER — Inpatient Hospital Stay (HOSPITAL_BASED_OUTPATIENT_CLINIC_OR_DEPARTMENT_OTHER): Payer: BLUE CROSS/BLUE SHIELD | Admitting: Hematology and Oncology

## 2019-01-09 DIAGNOSIS — E785 Hyperlipidemia, unspecified: Secondary | ICD-10-CM

## 2019-01-09 DIAGNOSIS — D696 Thrombocytopenia, unspecified: Secondary | ICD-10-CM | POA: Diagnosis not present

## 2019-01-09 DIAGNOSIS — Z923 Personal history of irradiation: Secondary | ICD-10-CM | POA: Diagnosis not present

## 2019-01-09 DIAGNOSIS — R5383 Other fatigue: Secondary | ICD-10-CM

## 2019-01-09 DIAGNOSIS — Z171 Estrogen receptor negative status [ER-]: Secondary | ICD-10-CM

## 2019-01-09 DIAGNOSIS — Z800 Family history of malignant neoplasm of digestive organs: Secondary | ICD-10-CM | POA: Diagnosis not present

## 2019-01-09 DIAGNOSIS — R21 Rash and other nonspecific skin eruption: Secondary | ICD-10-CM

## 2019-01-09 DIAGNOSIS — I10 Essential (primary) hypertension: Secondary | ICD-10-CM

## 2019-01-09 DIAGNOSIS — Z9221 Personal history of antineoplastic chemotherapy: Secondary | ICD-10-CM

## 2019-01-09 DIAGNOSIS — Z95828 Presence of other vascular implants and grafts: Secondary | ICD-10-CM

## 2019-01-09 DIAGNOSIS — Z79899 Other long term (current) drug therapy: Secondary | ICD-10-CM | POA: Diagnosis not present

## 2019-01-09 DIAGNOSIS — B029 Zoster without complications: Secondary | ICD-10-CM

## 2019-01-09 DIAGNOSIS — Z5111 Encounter for antineoplastic chemotherapy: Secondary | ICD-10-CM | POA: Diagnosis not present

## 2019-01-09 DIAGNOSIS — C50411 Malignant neoplasm of upper-outer quadrant of right female breast: Secondary | ICD-10-CM

## 2019-01-09 DIAGNOSIS — K5900 Constipation, unspecified: Secondary | ICD-10-CM | POA: Diagnosis not present

## 2019-01-09 LAB — CMP (CANCER CENTER ONLY)
ALK PHOS: 71 U/L (ref 38–126)
ALT: 31 U/L (ref 0–44)
ANION GAP: 7 (ref 5–15)
AST: 23 U/L (ref 15–41)
Albumin: 3.6 g/dL (ref 3.5–5.0)
BUN: 10 mg/dL (ref 6–20)
CO2: 26 mmol/L (ref 22–32)
Calcium: 9.3 mg/dL (ref 8.9–10.3)
Chloride: 108 mmol/L (ref 98–111)
Creatinine: 0.79 mg/dL (ref 0.44–1.00)
GFR, Est AFR Am: >60 mL/min (ref >60)
GFR, Estimated: >60 mL/min (ref >60)
Glucose, Bld: 96 mg/dL (ref 70–99)
Potassium: 4.1 mmol/L (ref 3.5–5.1)
Sodium: 141 mmol/L (ref 135–145)
Total Bilirubin: 0.4 mg/dL (ref 0.3–1.2)
Total Protein: 6.6 g/dL (ref 6.5–8.1)

## 2019-01-09 LAB — CBC WITH DIFFERENTIAL (CANCER CENTER ONLY)
Abs Immature Granulocytes: 0.03 10*3/uL (ref 0.00–0.07)
Basophils Absolute: 0 10*3/uL (ref 0.0–0.1)
Basophils Relative: 1 %
EOS ABS: 0.1 10*3/uL (ref 0.0–0.5)
Eosinophils Relative: 4 %
HCT: 36.1 % (ref 36.0–46.0)
Hemoglobin: 12.2 g/dL (ref 12.0–15.0)
Immature Granulocytes: 1 %
Lymphocytes Relative: 19 %
Lymphs Abs: 0.6 10*3/uL — ABNORMAL LOW (ref 0.7–4.0)
MCH: 33.7 pg (ref 26.0–34.0)
MCHC: 33.8 g/dL (ref 30.0–36.0)
MCV: 99.7 fL (ref 80.0–100.0)
Monocytes Absolute: 0.4 10*3/uL (ref 0.1–1.0)
Monocytes Relative: 13 %
Neutro Abs: 1.9 10*3/uL (ref 1.7–7.7)
Neutrophils Relative %: 62 %
Platelet Count: 174 10*3/uL (ref 150–400)
RBC: 3.62 MIL/uL — ABNORMAL LOW (ref 3.87–5.11)
RDW: 13.2 % (ref 11.5–15.5)
WBC: 3 10*3/uL — AB (ref 4.0–10.5)
nRBC: 0 % (ref 0.0–0.2)

## 2019-01-09 MED ORDER — DEXAMETHASONE SODIUM PHOSPHATE 10 MG/ML IJ SOLN
INTRAMUSCULAR | Status: AC
  Filled 2019-01-09: qty 1

## 2019-01-09 MED ORDER — SODIUM CHLORIDE 0.9 % IV SOLN
Freq: Once | INTRAVENOUS | Status: AC
  Administered 2019-01-09: 10:00:00 via INTRAVENOUS
  Filled 2019-01-09: qty 250

## 2019-01-09 MED ORDER — HEPARIN SOD (PORK) LOCK FLUSH 100 UNIT/ML IV SOLN
500.0000 [IU] | Freq: Once | INTRAVENOUS | Status: AC | PRN
  Administered 2019-01-09: 500 [IU]
  Filled 2019-01-09: qty 5

## 2019-01-09 MED ORDER — FAMOTIDINE IN NACL 20-0.9 MG/50ML-% IV SOLN
INTRAVENOUS | Status: AC
  Filled 2019-01-09: qty 50

## 2019-01-09 MED ORDER — SODIUM CHLORIDE 0.9 % IV SOLN
80.0000 mg/m2 | Freq: Once | INTRAVENOUS | Status: AC
  Administered 2019-01-09: 180 mg via INTRAVENOUS
  Filled 2019-01-09: qty 30

## 2019-01-09 MED ORDER — SODIUM CHLORIDE 0.9% FLUSH
10.0000 mL | INTRAVENOUS | Status: DC | PRN
  Administered 2019-01-09: 10 mL
  Filled 2019-01-09: qty 10

## 2019-01-09 MED ORDER — FAMOTIDINE IN NACL 20-0.9 MG/50ML-% IV SOLN
20.0000 mg | Freq: Once | INTRAVENOUS | Status: AC
  Administered 2019-01-09: 20 mg via INTRAVENOUS

## 2019-01-09 MED ORDER — DEXAMETHASONE SODIUM PHOSPHATE 10 MG/ML IJ SOLN
10.0000 mg | Freq: Once | INTRAMUSCULAR | Status: AC
  Administered 2019-01-09: 10 mg via INTRAVENOUS

## 2019-01-09 MED ORDER — DIPHENHYDRAMINE HCL 50 MG/ML IJ SOLN
25.0000 mg | Freq: Once | INTRAMUSCULAR | Status: AC
  Administered 2019-01-09: 25 mg via INTRAVENOUS

## 2019-01-09 MED ORDER — DIPHENHYDRAMINE HCL 50 MG/ML IJ SOLN
INTRAMUSCULAR | Status: AC
  Filled 2019-01-09: qty 1

## 2019-01-09 NOTE — Assessment & Plan Note (Signed)
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  Treatment plan: 1.Adjuvant chemotherapy with dose dense Adriamycin and Cytoxan x4 followed by Taxol weekly x12 2.Adjuvant radiation therapy  UPBEAT clinical trial ([id number]): No adverse effects being on the clinical trial _________________________________________________________________________________________ Current treatment: Taxol weekly, today's cycle 9  Chemo toxicities: 1. Facial rash:improved with metronidazole cream 2. Thrombocytopenia:platelets have normalized 3. Constipation:  stool softeners and Miralax, resolved at this point 4. Fungal rash under breast: Ketoconazole cream prescribed, improved  No evidence of neuropathy Return to clinic weekly for labs and Taxol, every other week for labs and f/u

## 2019-01-09 NOTE — Patient Instructions (Signed)
[facility] Discharge Instructions for Patients Receiving Chemotherapy  Today you received the following chemotherapy agents Paclitaxel (TAXOL).  To help prevent nausea and vomiting after your treatment, we encourage you to take your nausea medication as prescribed.  If you develop nausea and vomiting that is not controlled by your nausea medication, call the clinic.   BELOW ARE SYMPTOMS THAT SHOULD BE REPORTED IMMEDIATELY:  *FEVER GREATER THAN 100.5 F  *CHILLS WITH OR WITHOUT FEVER  NAUSEA AND VOMITING THAT IS NOT CONTROLLED WITH YOUR NAUSEA MEDICATION  *UNUSUAL SHORTNESS OF BREATH  *UNUSUAL BRUISING OR BLEEDING  TENDERNESS IN MOUTH AND THROAT WITH OR WITHOUT PRESENCE OF ULCERS  *URINARY PROBLEMS  *BOWEL PROBLEMS  UNUSUAL RASH Items with * indicate a potential emergency and should be followed up as soon as possible.  Feel free to call the clinic should you have any questions or concerns. The clinic phone number is [phone].  Please show the CHEMO ALERT CARD at check-in to the Emergency Department and triage nurse.   

## 2019-01-10 ENCOUNTER — Telehealth: Payer: Self-pay | Admitting: Hematology and Oncology

## 2019-01-10 NOTE — Telephone Encounter (Signed)
No los °

## 2019-01-16 ENCOUNTER — Inpatient Hospital Stay: Payer: BLUE CROSS/BLUE SHIELD

## 2019-01-16 ENCOUNTER — Inpatient Hospital Stay: Payer: BLUE CROSS/BLUE SHIELD | Attending: Hematology and Oncology

## 2019-01-16 VITALS — BP 129/85 | HR 84 | Temp 97.8°F | Resp 18

## 2019-01-16 DIAGNOSIS — C50411 Malignant neoplasm of upper-outer quadrant of right female breast: Secondary | ICD-10-CM | POA: Insufficient documentation

## 2019-01-16 DIAGNOSIS — Z5111 Encounter for antineoplastic chemotherapy: Secondary | ICD-10-CM | POA: Insufficient documentation

## 2019-01-16 DIAGNOSIS — Z800 Family history of malignant neoplasm of digestive organs: Secondary | ICD-10-CM | POA: Diagnosis not present

## 2019-01-16 DIAGNOSIS — R5383 Other fatigue: Secondary | ICD-10-CM | POA: Insufficient documentation

## 2019-01-16 DIAGNOSIS — E785 Hyperlipidemia, unspecified: Secondary | ICD-10-CM | POA: Insufficient documentation

## 2019-01-16 DIAGNOSIS — Z95828 Presence of other vascular implants and grafts: Secondary | ICD-10-CM

## 2019-01-16 DIAGNOSIS — Z9221 Personal history of antineoplastic chemotherapy: Secondary | ICD-10-CM | POA: Insufficient documentation

## 2019-01-16 DIAGNOSIS — I10 Essential (primary) hypertension: Secondary | ICD-10-CM | POA: Diagnosis not present

## 2019-01-16 DIAGNOSIS — Z171 Estrogen receptor negative status [ER-]: Principal | ICD-10-CM

## 2019-01-16 DIAGNOSIS — R21 Rash and other nonspecific skin eruption: Secondary | ICD-10-CM | POA: Diagnosis not present

## 2019-01-16 DIAGNOSIS — G629 Polyneuropathy, unspecified: Secondary | ICD-10-CM | POA: Diagnosis not present

## 2019-01-16 LAB — CMP (CANCER CENTER ONLY)
ALK PHOS: 79 U/L (ref 38–126)
ALT: 29 U/L (ref 0–44)
AST: 20 U/L (ref 15–41)
Albumin: 3.6 g/dL (ref 3.5–5.0)
Anion gap: 8 (ref 5–15)
BUN: 10 mg/dL (ref 6–20)
CO2: 23 mmol/L (ref 22–32)
Calcium: 9.4 mg/dL (ref 8.9–10.3)
Chloride: 110 mmol/L (ref 98–111)
Creatinine: 0.79 mg/dL (ref 0.44–1.00)
GFR, Est AFR Am: >60 mL/min (ref >60)
GFR, Estimated: >60 mL/min (ref >60)
Glucose, Bld: 102 mg/dL — ABNORMAL HIGH (ref 70–99)
Potassium: 3.9 mmol/L (ref 3.5–5.1)
Sodium: 141 mmol/L (ref 135–145)
Total Bilirubin: 0.4 mg/dL (ref 0.3–1.2)
Total Protein: 6.7 g/dL (ref 6.5–8.1)

## 2019-01-16 LAB — CBC WITH DIFFERENTIAL (CANCER CENTER ONLY)
ABS IMMATURE GRANULOCYTES: 0.01 10*3/uL (ref 0.00–0.07)
Basophils Absolute: 0 10*3/uL (ref 0.0–0.1)
Basophils Relative: 0 %
Eosinophils Absolute: 0.1 10*3/uL (ref 0.0–0.5)
Eosinophils Relative: 4 %
HCT: 36.9 % (ref 36.0–46.0)
Hemoglobin: 12 g/dL (ref 12.0–15.0)
IMMATURE GRANULOCYTES: 0 %
Lymphocytes Relative: 19 %
Lymphs Abs: 0.6 10*3/uL — ABNORMAL LOW (ref 0.7–4.0)
MCH: 33 pg (ref 26.0–34.0)
MCHC: 32.5 g/dL (ref 30.0–36.0)
MCV: 101.4 fL — ABNORMAL HIGH (ref 80.0–100.0)
Monocytes Absolute: 0.4 10*3/uL (ref 0.1–1.0)
Monocytes Relative: 11 %
Neutro Abs: 2.1 10*3/uL (ref 1.7–7.7)
Neutrophils Relative %: 66 %
Platelet Count: 169 10*3/uL (ref 150–400)
RBC: 3.64 MIL/uL — ABNORMAL LOW (ref 3.87–5.11)
RDW: 13 % (ref 11.5–15.5)
WBC Count: 3.1 10*3/uL — ABNORMAL LOW (ref 4.0–10.5)
nRBC: 0 % (ref 0.0–0.2)

## 2019-01-16 MED ORDER — DIPHENHYDRAMINE HCL 50 MG/ML IJ SOLN
25.0000 mg | Freq: Once | INTRAMUSCULAR | Status: AC
  Administered 2019-01-16: 25 mg via INTRAVENOUS

## 2019-01-16 MED ORDER — DEXAMETHASONE SODIUM PHOSPHATE 10 MG/ML IJ SOLN
10.0000 mg | Freq: Once | INTRAMUSCULAR | Status: AC
  Administered 2019-01-16: 10 mg via INTRAVENOUS

## 2019-01-16 MED ORDER — DEXAMETHASONE SODIUM PHOSPHATE 10 MG/ML IJ SOLN
INTRAMUSCULAR | Status: AC
  Filled 2019-01-16: qty 1

## 2019-01-16 MED ORDER — DIPHENHYDRAMINE HCL 50 MG/ML IJ SOLN
INTRAMUSCULAR | Status: AC
  Filled 2019-01-16: qty 1

## 2019-01-16 MED ORDER — FAMOTIDINE IN NACL 20-0.9 MG/50ML-% IV SOLN
20.0000 mg | Freq: Once | INTRAVENOUS | Status: AC
  Administered 2019-01-16: 20 mg via INTRAVENOUS

## 2019-01-16 MED ORDER — SODIUM CHLORIDE 0.9 % IV SOLN
Freq: Once | INTRAVENOUS | Status: AC
  Administered 2019-01-16: 09:00:00 via INTRAVENOUS
  Filled 2019-01-16: qty 250

## 2019-01-16 MED ORDER — HEPARIN SOD (PORK) LOCK FLUSH 100 UNIT/ML IV SOLN
500.0000 [IU] | Freq: Once | INTRAVENOUS | Status: AC | PRN
  Administered 2019-01-16: 500 [IU]
  Filled 2019-01-16: qty 5

## 2019-01-16 MED ORDER — FAMOTIDINE IN NACL 20-0.9 MG/50ML-% IV SOLN
INTRAVENOUS | Status: AC
  Filled 2019-01-16: qty 50

## 2019-01-16 MED ORDER — SODIUM CHLORIDE 0.9 % IV SOLN
80.0000 mg/m2 | Freq: Once | INTRAVENOUS | Status: AC
  Administered 2019-01-16: 180 mg via INTRAVENOUS
  Filled 2019-01-16: qty 30

## 2019-01-16 MED ORDER — SODIUM CHLORIDE 0.9% FLUSH
10.0000 mL | INTRAVENOUS | Status: DC | PRN
  Administered 2019-01-16: 10 mL
  Filled 2019-01-16: qty 10

## 2019-01-16 NOTE — Patient Instructions (Signed)
[facility] Discharge Instructions for Patients Receiving Chemotherapy  Today you received the following chemotherapy agents:  Taxol.  To help prevent nausea and vomiting after your treatment, we encourage you to take your nausea medication as directed.   If you develop nausea and vomiting that is not controlled by your nausea medication, call the clinic.   BELOW ARE SYMPTOMS THAT SHOULD BE REPORTED IMMEDIATELY:  *FEVER GREATER THAN 100.5 F  *CHILLS WITH OR WITHOUT FEVER  NAUSEA AND VOMITING THAT IS NOT CONTROLLED WITH YOUR NAUSEA MEDICATION  *UNUSUAL SHORTNESS OF BREATH  *UNUSUAL BRUISING OR BLEEDING  TENDERNESS IN MOUTH AND THROAT WITH OR WITHOUT PRESENCE OF ULCERS  *URINARY PROBLEMS  *BOWEL PROBLEMS  UNUSUAL RASH Items with * indicate a potential emergency and should be followed up as soon as possible.  Feel free to call the clinic should you have any questions or concerns. The clinic phone number is [phone].  Please show the CHEMO ALERT CARD at check-in to the Emergency Department and triage nurse.   

## 2019-01-23 ENCOUNTER — Inpatient Hospital Stay: Payer: BLUE CROSS/BLUE SHIELD

## 2019-01-23 ENCOUNTER — Inpatient Hospital Stay (HOSPITAL_BASED_OUTPATIENT_CLINIC_OR_DEPARTMENT_OTHER): Payer: BLUE CROSS/BLUE SHIELD | Admitting: Adult Health

## 2019-01-23 ENCOUNTER — Telehealth: Payer: Self-pay | Admitting: Adult Health

## 2019-01-23 ENCOUNTER — Encounter: Payer: Self-pay | Admitting: Adult Health

## 2019-01-23 VITALS — BP 123/86 | HR 79 | Temp 98.0°F | Resp 18 | Ht 66.5 in | Wt 231.9 lb

## 2019-01-23 DIAGNOSIS — Z171 Estrogen receptor negative status [ER-]: Secondary | ICD-10-CM

## 2019-01-23 DIAGNOSIS — I10 Essential (primary) hypertension: Secondary | ICD-10-CM

## 2019-01-23 DIAGNOSIS — Z800 Family history of malignant neoplasm of digestive organs: Secondary | ICD-10-CM

## 2019-01-23 DIAGNOSIS — C50411 Malignant neoplasm of upper-outer quadrant of right female breast: Secondary | ICD-10-CM | POA: Diagnosis not present

## 2019-01-23 DIAGNOSIS — E785 Hyperlipidemia, unspecified: Secondary | ICD-10-CM

## 2019-01-23 DIAGNOSIS — R21 Rash and other nonspecific skin eruption: Secondary | ICD-10-CM | POA: Diagnosis not present

## 2019-01-23 DIAGNOSIS — Z9221 Personal history of antineoplastic chemotherapy: Secondary | ICD-10-CM | POA: Diagnosis not present

## 2019-01-23 DIAGNOSIS — Z5111 Encounter for antineoplastic chemotherapy: Secondary | ICD-10-CM

## 2019-01-23 DIAGNOSIS — Z95828 Presence of other vascular implants and grafts: Secondary | ICD-10-CM

## 2019-01-23 DIAGNOSIS — G629 Polyneuropathy, unspecified: Secondary | ICD-10-CM | POA: Diagnosis not present

## 2019-01-23 DIAGNOSIS — R5383 Other fatigue: Secondary | ICD-10-CM

## 2019-01-23 LAB — CBC WITH DIFFERENTIAL (CANCER CENTER ONLY)
Abs Immature Granulocytes: 0.02 10*3/uL (ref 0.00–0.07)
Basophils Absolute: 0 10*3/uL (ref 0.0–0.1)
Basophils Relative: 1 %
Eosinophils Absolute: 0.1 10*3/uL (ref 0.0–0.5)
Eosinophils Relative: 5 %
HCT: 36.6 % (ref 36.0–46.0)
Hemoglobin: 11.8 g/dL — ABNORMAL LOW (ref 12.0–15.0)
Immature Granulocytes: 1 %
LYMPHS ABS: 0.5 10*3/uL — AB (ref 0.7–4.0)
Lymphocytes Relative: 19 %
MCH: 32.3 pg (ref 26.0–34.0)
MCHC: 32.2 g/dL (ref 30.0–36.0)
MCV: 100.3 fL — ABNORMAL HIGH (ref 80.0–100.0)
Monocytes Absolute: 0.4 10*3/uL (ref 0.1–1.0)
Monocytes Relative: 14 %
Neutro Abs: 1.6 10*3/uL — ABNORMAL LOW (ref 1.7–7.7)
Neutrophils Relative %: 60 %
Platelet Count: 205 10*3/uL (ref 150–400)
RBC: 3.65 MIL/uL — ABNORMAL LOW (ref 3.87–5.11)
RDW: 12.8 % (ref 11.5–15.5)
WBC Count: 2.6 10*3/uL — ABNORMAL LOW (ref 4.0–10.5)
nRBC: 0 % (ref 0.0–0.2)

## 2019-01-23 LAB — CMP (CANCER CENTER ONLY)
ALT: 21 U/L (ref 0–44)
AST: 20 U/L (ref 15–41)
Albumin: 3.5 g/dL (ref 3.5–5.0)
Alkaline Phosphatase: 79 U/L (ref 38–126)
Anion gap: 8 (ref 5–15)
BUN: 13 mg/dL (ref 6–20)
CO2: 25 mmol/L (ref 22–32)
Calcium: 9.5 mg/dL (ref 8.9–10.3)
Chloride: 108 mmol/L (ref 98–111)
Creatinine: 0.83 mg/dL (ref 0.44–1.00)
GFR, Est AFR Am: >60 mL/min (ref >60)
GFR, Estimated: >60 mL/min (ref >60)
Glucose, Bld: 90 mg/dL (ref 70–99)
Potassium: 4 mmol/L (ref 3.5–5.1)
Sodium: 141 mmol/L (ref 135–145)
Total Bilirubin: 0.4 mg/dL (ref 0.3–1.2)
Total Protein: 6.7 g/dL (ref 6.5–8.1)

## 2019-01-23 MED ORDER — SODIUM CHLORIDE 0.9% FLUSH
10.0000 mL | INTRAVENOUS | Status: DC | PRN
  Administered 2019-01-23: 10 mL
  Filled 2019-01-23: qty 10

## 2019-01-23 MED ORDER — DIPHENHYDRAMINE HCL 50 MG/ML IJ SOLN
INTRAMUSCULAR | Status: AC
  Filled 2019-01-23: qty 1

## 2019-01-23 MED ORDER — DEXAMETHASONE SODIUM PHOSPHATE 10 MG/ML IJ SOLN
10.0000 mg | Freq: Once | INTRAMUSCULAR | Status: AC
  Administered 2019-01-23: 10 mg via INTRAVENOUS

## 2019-01-23 MED ORDER — DIPHENHYDRAMINE HCL 50 MG/ML IJ SOLN
25.0000 mg | Freq: Once | INTRAMUSCULAR | Status: AC
  Administered 2019-01-23: 25 mg via INTRAVENOUS

## 2019-01-23 MED ORDER — SODIUM CHLORIDE 0.9 % IV SOLN
Freq: Once | INTRAVENOUS | Status: AC
  Administered 2019-01-23: 09:00:00 via INTRAVENOUS
  Filled 2019-01-23: qty 250

## 2019-01-23 MED ORDER — SODIUM CHLORIDE 0.9 % IV SOLN
80.0000 mg/m2 | Freq: Once | INTRAVENOUS | Status: AC
  Administered 2019-01-23: 180 mg via INTRAVENOUS
  Filled 2019-01-23: qty 30

## 2019-01-23 MED ORDER — HEPARIN SOD (PORK) LOCK FLUSH 100 UNIT/ML IV SOLN
500.0000 [IU] | Freq: Once | INTRAVENOUS | Status: AC | PRN
  Administered 2019-01-23: 500 [IU]
  Filled 2019-01-23: qty 5

## 2019-01-23 MED ORDER — DEXAMETHASONE SODIUM PHOSPHATE 10 MG/ML IJ SOLN
INTRAMUSCULAR | Status: AC
  Filled 2019-01-23: qty 1

## 2019-01-23 MED ORDER — FAMOTIDINE IN NACL 20-0.9 MG/50ML-% IV SOLN
20.0000 mg | Freq: Once | INTRAVENOUS | Status: AC
  Administered 2019-01-23: 20 mg via INTRAVENOUS

## 2019-01-23 MED ORDER — FAMOTIDINE IN NACL 20-0.9 MG/50ML-% IV SOLN
INTRAVENOUS | Status: AC
  Filled 2019-01-23: qty 50

## 2019-01-23 NOTE — Assessment & Plan Note (Addendum)
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  Treatment plan: 1.Adjuvant chemotherapy with dose dense Adriamycin and Cytoxan x4 followed by Taxol weekly x12 2.Adjuvant radiation therapy  UPBEAT clinical trial ([id number]): No adverse effects being on the clinical trial _________________________________________________________________________________________ Current treatment: Taxol weekly, today's cycle 9  Chemo toxicities: 1. Facial rash:improved with metronidazole cream 2. Thrombocytopenia:platelets have normalized 3. Constipation:  stool softeners and Miralax, resolved at this point 4. Fungal rash under breast: Ketoconazole cream prescribed, resolved 5. 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  She knows to call us if it worsens between now and next week.  I referred Samina to radiation oncology, she will see Dr. [name] in 03/2019 for follow up and she will see me in 06/2019 for her survivorship care plan visit.  I also sent a message to Dr. Dalbert Batman clearing her for port removal after she receives her last weekly Taxol on 01/30/2019.

## 2019-01-23 NOTE — Telephone Encounter (Signed)
Gave avs and calendar ° °

## 2019-01-23 NOTE — Progress Notes (Signed)
[facility] Follow up:    [name], MD [address] [place] [address]   DIAGNOSIS: Cancer Staging Malignant neoplasm of upper-outer quadrant of right breast in female, estrogen receptor negative ([place]) Staging form: Breast, AJCC 8th Edition - Clinical: Stage IB (cT1c, cN0, cM0, G2, ER-, PR-, HER2-) - Unsigned - Pathologic stage from 08/07/2018: Stage IB (pT1c, pN0, cM0, G2, ER-, PR-, HER2-) - Signed by Gardenia Phlegm, NP on 08/22/2018   SUMMARY OF ONCOLOGIC HISTORY:   Malignant neoplasm of upper-outer quadrant of right breast in female, estrogen receptor negative ([place])   07/13/2018 Initial Diagnosis    Screening detected right breast asymmetry UOQ 10 o'clock position 1.4 cm, axilla negative, biopsy revealed grade 2 IDC triple negative with a Ki-67 of 50% T1c N0 stage Ib AJCC 8    08/06/2018 Genetic Testing    NBN c.2117C>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  The Hereditary Gene Panel offered by [facility] includes sequencing and/or deletion duplication testing of the following 47 genes: APC, ATM, AXIN2, BARD1, BMPR1A, BRCA1, BRCA2, BRIP1, CDH1, CDK4, CDKN2A (p14ARF), CDKN2A (p16INK4a), CHEK2, CTNNA1, DICER1, EPCAM (Deletion/duplication testing only), GREM1 (promoter region deletion/duplication testing only), KIT, MEN1, MLH1, MSH2, MSH3, MSH6, MUTYH, NBN, NF1, NHTL1, PALB2, PDGFRA, PMS2, POLD1, POLE, PTEN, RAD50, RAD51C, RAD51D, SDHB, SDHC, SDHD, SMAD4, SMARCA4. STK11, TP53, TSC1, TSC2, and VHL.  The following genes were evaluated for sequence changes only: SDHA and HOXB13 c.251G>A variant only. The report date is August 06, 2018.    08/07/2018 Surgery    Right lumpectomy: IDC grade 2, 1.5 cm, with intermediate grade DCIS, margins negative, 0/3 lymph nodes negative, triple negative with Ki-67 of 50%, T1 CN 0 stage IB    08/07/2018 Cancer Staging    Staging form:  Breast, AJCC 8th Edition - Pathologic stage from 08/07/2018: Stage IB (pT1c, pN0, cM0, G2, ER-, PR-, HER2-) - Signed by Gardenia Phlegm, NP on 08/22/2018    09/05/2018 -  Chemotherapy    Adriamycin and Cytoxan x 4 to be followed by weekly Taxol    11/07/2018 -  Chemotherapy    The patient had PACLitaxel (TAXOL) 180 mg in sodium chloride 0.9 % 250 mL chemo infusion (</= 71m/m2), 80 mg/m2 = 180 mg, Intravenous,  Once, 11 of 12 cycles Administration: 180 mg (11/07/2018), 180 mg (11/14/2018), 180 mg (11/21/2018), 180 mg (11/28/2018), 180 mg (12/06/2018), 180 mg (12/13/2018), 180 mg (12/19/2018), 180 mg (01/02/2019), 180 mg (01/09/2019), 180 mg (01/16/2019)  for chemotherapy treatment.      CURRENT THERAPY: Taxol  INTERVAL HISTORY: Jennifer Carroll 53y.o. female returns for evaluation prior to receiving week 11 of Taxol.  She is doing well today.  She has mild fatigue.  She continues to work at [employer]  She has mild intermittent neuropathy in her thumbs bilaterally.  She says she is able to button, zip and write as needed.     Patient Active Problem List   Diagnosis Date Noted  . Herpes zoster 12/27/2018  . Port-A-Cath in place 09/12/2018  . 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 Genetic testing 08/06/2018  . Family history of pancreatic cancer   . Malignant neoplasm of upper-outer quadrant of right breast in female, estrogen receptor negative ([place] 07/18/2018  . Irregular menses 05/08/2012    has No Known Allergies.  MEDICAL HISTORY: Past Medical History:  Diagnosis Date  . AMA (advanced maternal age) multigravida  35+ 03/28/05   with no amniocentesis  . 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 Cancer [facility])    Right breast   . Family history of pancreatic cancer   . H/O infertility   . H/O menorrhagia   . History of chicken pox   . History of measles, mumps, or rubella   . Hyperlipidemia   . Hypertension   . Low iron   . Menses, irregular   . Monoallelic  mutation of NBN gene in female 08/14/2018   Patient had genetic testing reported out on 08/06/2018 that revealed a heterozygous pathogenic variant in NBN c.2117C>G (p.Ser706*).   [name]     SURGICAL HISTORY: Past Surgical History:  Procedure Laterality Date  . BREAST LUMPECTOMY WITH RADIOACTIVE SEED AND SENTINEL LYMPH NODE BIOPSY Right 08/07/2018   Procedure: INJECT BLUE DYE RIGHT BREAST,RIGHT BREAST LUMPECTOMY WITH RADIOACTIVE SEED AND RIGHT AXILLARY DEEP SENTINEL LYMPH NODE BIOPSY;  Surgeon: Fanny Skates, MD;  Location: [facility];  Service: General;  Laterality: Right;  . EYE SURGERY     left eye at age 7  . head surgery     to cover hole in head at age of  45 yrs young  . PORTACATH PLACEMENT Left 08/07/2018   Procedure: INSERTION PORT-A-CATH;  Surgeon: Fanny Skates, MD;  Location: [place];  Service: General;  Laterality: Left;  . WISDOM TOOTH EXTRACTION  2001    SOCIAL HISTORY: Social History   Socioeconomic History  . Marital status: Married    Spouse name: Not on file  . Number of children: Not on file  . Years of education: Not on file  . Highest education level: Not on file  Occupational History  . Not on file  Social Needs  . Financial resource strain: Not on file  . Food insecurity:    Worry: Not on file    Inability: Not on file  . Transportation needs:    Medical: Not on file    Non-medical: Not on file  Tobacco Use  . Smoking status: Never Smoker  . Smokeless tobacco: Never Used  Substance and Sexual Activity  . Alcohol use: Not Currently  . Drug use: Never  . Sexual activity: Yes  Lifestyle  . Physical activity:    Days per week: Not on file    Minutes per session: Not on file  . Stress: Not on file  Relationships  . Social connections:    Talks on phone: Not on file    Gets together: Not on file    Attends religious service: Not on file    Active member of club or organization: Not on file    Attends  meetings of clubs or organizations: Not on file    Relationship status: Not on file  . Intimate partner violence:    Fear of current or ex partner: Not on file    Emotionally abused: Not on file    Physically abused: Not on file    Forced sexual activity: Not on file  Other Topics Concern  . Not on file  Social History Narrative  . Not on file    FAMILY HISTORY: Family History  Problem Relation Age of Onset  . Hypertension Mother   . Kidney disease Mother   . Heart disease Father   . Hypertension Brother   . Kidney failure Maternal Grandfather   . Pancreatic cancer Cousin        dx >50    Review of  Systems  Constitutional: Negative for appetite change, chills, fatigue, fever and unexpected weight change.  HENT:   Negative for hearing loss, lump/mass, nosebleeds, sore throat, tinnitus and voice change.   Eyes: Negative for eye problems and icterus.  Respiratory: Negative for chest tightness, cough and shortness of breath.   Cardiovascular: Negative for chest pain, leg swelling and palpitations.  Gastrointestinal: Negative for abdominal distention, abdominal pain, constipation, diarrhea, nausea and vomiting.  Endocrine: Negative for hot flashes.  Genitourinary: Negative for difficulty urinating.   Musculoskeletal: Negative for arthralgias.  Skin: Negative for itching and rash.  Neurological: Negative for dizziness, extremity weakness, headaches and numbness.  Hematological: Negative for adenopathy. Does not bruise/bleed easily.  Psychiatric/Behavioral: Negative for depression. The patient is not nervous/anxious.       PHYSICAL EXAMINATION  ECOG PERFORMANCE STATUS: 1 - Symptomatic but completely ambulatory  Vitals:   01/23/19 0848  BP: 123/86  Pulse: 79  Resp: 18  Temp: 98 F (36.7 C)  SpO2: 98%    Physical Exam Constitutional:      General: She is not in acute distress.    Appearance: Normal appearance.  HENT:     Head: Normocephalic and atraumatic.      Mouth/Throat:     Mouth: Mucous membranes are moist.     Pharynx: Oropharynx is clear. No oropharyngeal exudate or posterior oropharyngeal erythema.  Eyes:     General: No scleral icterus.    Pupils: Pupils are equal, round, and reactive to light.  Neck:     Musculoskeletal: Neck supple.  Cardiovascular:     Rate and Rhythm: Normal rate and regular rhythm.     Pulses: Normal pulses.     Heart sounds: Normal heart sounds.  Pulmonary:     Effort: Pulmonary effort is normal.     Breath sounds: Normal breath sounds.     Comments: Right breast s/p lumpectomy, no sign of local recurrence, left breast benign Abdominal:     General: Abdomen is flat. Bowel sounds are normal. There is no distension.     Palpations: Abdomen is soft.     Tenderness: There is no abdominal tenderness.  Musculoskeletal:        General: No swelling.  Lymphadenopathy:     Cervical: No cervical adenopathy.  Skin:    General: Skin is warm and dry.     Capillary Refill: Capillary refill takes less than 2 seconds.     Findings: No rash.  Neurological:     General: No focal deficit present.     Mental Status: She is alert.  Psychiatric:        Mood and Affect: Mood normal.        Behavior: Behavior normal.     LABORATORY DATA:  CBC    Component Value Date/Time   WBC 2.6 (L) 01/23/2019 0800   RBC 3.65 (L) 01/23/2019 0800   HGB 11.8 (L) 01/23/2019 0800   HCT 36.6 01/23/2019 0800   PLT 205 01/23/2019 0800   MCV 100.3 (H) 01/23/2019 0800   MCH 32.3 01/23/2019 0800   MCHC 32.2 01/23/2019 0800   RDW 12.8 01/23/2019 0800   LYMPHSABS 0.5 (L) 01/23/2019 0800   MONOABS 0.4 01/23/2019 0800   EOSABS 0.1 01/23/2019 0800   BASOSABS 0.0 01/23/2019 0800    CMP     Component Value Date/Time   NA 141 01/23/2019 0800   K 4.0 01/23/2019 0800   CL 108 01/23/2019 0800   CO2 25 01/23/2019 0800   GLUCOSE 90  01/23/2019 0800   BUN 13 01/23/2019 0800   CREATININE 0.83 01/23/2019 0800   CALCIUM 9.5 01/23/2019 0800    PROT 6.7 01/23/2019 0800   ALBUMIN 3.5 01/23/2019 0800   AST 20 01/23/2019 0800   ALT 21 01/23/2019 0800   ALKPHOS 79 01/23/2019 0800   BILITOT 0.4 01/23/2019 0800   GFRNONAA >60 01/23/2019 0800   GFRAA >60 01/23/2019 0800      ASSESSMENT and PLAN:   Malignant neoplasm of upper-outer quadrant of right breast in female, estrogen receptor negative ([place]) 08/07/2018:Right lumpectomy: IDC grade 2, 1.5 cm, with intermediate grade DCIS, margins negative, 0/3 lymph nodes negative, triple negative with Ki-67 of 50%, T1 CN 0 stage IB Patient works at [employer] and can work from home if necessary.  Treatment plan: 1.Adjuvant chemotherapy with dose dense Adriamycin and Cytoxan x4 followed by Taxol weekly x12 2.Adjuvant radiation therapy  UPBEAT clinical trial ([id number]): No adverse effects being on the clinical trial _________________________________________________________________________________________ Current treatment: Taxol weekly, today's cycle 9  Chemo toxicities: 1. Facial rash:improved with metronidazole cream 2. Thrombocytopenia:platelets have normalized 3. Constipation:  stool softeners and Miralax, resolved at this point 4. Fungal rash under breast: Ketoconazole cream prescribed, resolved 5. 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  She knows to call us if it worsens between now and next week.  I referred Jeannine to radiation oncology, she will see Dr. [name] in 03/2019 for follow up and she will see me in 06/2019 for her survivorship care plan visit.  I also sent a message to Dr. Dalbert Batman clearing her for port removal after she receives her last weekly Taxol on 01/30/2019.       Orders Placed This Encounter  Procedures  . Ambulatory referral to Radiation Oncology    Referral Priority:   Urgent    Referral Type:   Consultation    Referral Reason:   Specialty Services Required    Referred to Provider:   Kyung Rudd, MD    Requested Specialty:   Radiation Oncology     Number of Visits Requested:   1    All questions were answered. The patient knows to call the clinic with any problems, questions or concerns. We can certainly see the patient much sooner if necessary.  A total of (30) minutes of face-to-face time was spent with this patient with greater than 50% of that time in counseling and care-coordination.  This note was electronically signed. Scot Dock, NP 01/23/2019

## 2019-01-23 NOTE — Patient Instructions (Signed)
[facility] Discharge Instructions for Patients Receiving Chemotherapy  Today you received the following chemotherapy agents:  Taxol.  To help prevent nausea and vomiting after your treatment, we encourage you to take your nausea medication as directed.   If you develop nausea and vomiting that is not controlled by your nausea medication, call the clinic.   BELOW ARE SYMPTOMS THAT SHOULD BE REPORTED IMMEDIATELY:  *FEVER GREATER THAN 100.5 F  *CHILLS WITH OR WITHOUT FEVER  NAUSEA AND VOMITING THAT IS NOT CONTROLLED WITH YOUR NAUSEA MEDICATION  *UNUSUAL SHORTNESS OF BREATH  *UNUSUAL BRUISING OR BLEEDING  TENDERNESS IN MOUTH AND THROAT WITH OR WITHOUT PRESENCE OF ULCERS  *URINARY PROBLEMS  *BOWEL PROBLEMS  UNUSUAL RASH Items with * indicate a potential emergency and should be followed up as soon as possible.  Feel free to call the clinic should you have any questions or concerns. The clinic phone number is [phone].  Please show the CHEMO ALERT CARD at check-in to the Emergency Department and triage nurse.   

## 2019-01-28 ENCOUNTER — Other Ambulatory Visit: Payer: Self-pay | Admitting: Hematology and Oncology

## 2019-01-30 ENCOUNTER — Inpatient Hospital Stay: Payer: BLUE CROSS/BLUE SHIELD

## 2019-01-30 ENCOUNTER — Ambulatory Visit
Admission: RE | Admit: 2019-01-30 | Discharge: 2019-01-30 | Disposition: A | Discharge status: Home / Self Care | Payer: BLUE CROSS/BLUE SHIELD | Source: Ambulatory Visit | Attending: Radiation Oncology | Admitting: Radiation Oncology

## 2019-01-30 ENCOUNTER — Other Ambulatory Visit: Payer: Self-pay

## 2019-01-30 ENCOUNTER — Encounter: Payer: Self-pay | Admitting: Radiation Oncology

## 2019-01-30 VITALS — BP 139/74 | HR 71 | Temp 97.6°F | Resp 18 | Wt 232.4 lb

## 2019-01-30 DIAGNOSIS — E785 Hyperlipidemia, unspecified: Secondary | ICD-10-CM | POA: Insufficient documentation

## 2019-01-30 DIAGNOSIS — Z171 Estrogen receptor negative status [ER-]: Secondary | ICD-10-CM | POA: Diagnosis not present

## 2019-01-30 DIAGNOSIS — C50411 Malignant neoplasm of upper-outer quadrant of right female breast: Secondary | ICD-10-CM | POA: Diagnosis not present

## 2019-01-30 DIAGNOSIS — Z800 Family history of malignant neoplasm of digestive organs: Secondary | ICD-10-CM | POA: Diagnosis not present

## 2019-01-30 DIAGNOSIS — Z79899 Other long term (current) drug therapy: Secondary | ICD-10-CM | POA: Diagnosis not present

## 2019-01-30 DIAGNOSIS — G629 Polyneuropathy, unspecified: Secondary | ICD-10-CM | POA: Diagnosis not present

## 2019-01-30 DIAGNOSIS — Z5111 Encounter for antineoplastic chemotherapy: Secondary | ICD-10-CM | POA: Diagnosis not present

## 2019-01-30 DIAGNOSIS — Z9221 Personal history of antineoplastic chemotherapy: Secondary | ICD-10-CM | POA: Diagnosis not present

## 2019-01-30 DIAGNOSIS — R21 Rash and other nonspecific skin eruption: Secondary | ICD-10-CM | POA: Diagnosis not present

## 2019-01-30 DIAGNOSIS — I10 Essential (primary) hypertension: Secondary | ICD-10-CM | POA: Diagnosis not present

## 2019-01-30 DIAGNOSIS — Z95828 Presence of other vascular implants and grafts: Secondary | ICD-10-CM

## 2019-01-30 DIAGNOSIS — R5383 Other fatigue: Secondary | ICD-10-CM | POA: Diagnosis not present

## 2019-01-30 DIAGNOSIS — Z9889 Other specified postprocedural states: Secondary | ICD-10-CM | POA: Diagnosis not present

## 2019-01-30 LAB — CMP (CANCER CENTER ONLY)
ALT: 22 U/L (ref 0–44)
AST: 21 U/L (ref 15–41)
Albumin: 3.5 g/dL (ref 3.5–5.0)
Alkaline Phosphatase: 89 U/L (ref 38–126)
Anion gap: 9 (ref 5–15)
BUN: 13 mg/dL (ref 6–20)
CO2: 25 mmol/L (ref 22–32)
Calcium: 9.5 mg/dL (ref 8.9–10.3)
Chloride: 107 mmol/L (ref 98–111)
Creatinine: 0.84 mg/dL (ref 0.44–1.00)
GFR, Est AFR Am: >60 mL/min (ref >60)
GFR, Estimated: >60 mL/min (ref >60)
Glucose, Bld: 101 mg/dL — ABNORMAL HIGH (ref 70–99)
Potassium: 4 mmol/L (ref 3.5–5.1)
Sodium: 141 mmol/L (ref 135–145)
Total Bilirubin: 0.4 mg/dL (ref 0.3–1.2)
Total Protein: 6.9 g/dL (ref 6.5–8.1)

## 2019-01-30 LAB — CBC WITH DIFFERENTIAL (CANCER CENTER ONLY)
ABS IMMATURE GRANULOCYTES: 0.01 10*3/uL (ref 0.00–0.07)
BASOS ABS: 0 10*3/uL (ref 0.0–0.1)
Basophils Relative: 1 %
Eosinophils Absolute: 0.1 10*3/uL (ref 0.0–0.5)
Eosinophils Relative: 4 %
HCT: 34.7 % — ABNORMAL LOW (ref 36.0–46.0)
Hemoglobin: 11.6 g/dL — ABNORMAL LOW (ref 12.0–15.0)
Immature Granulocytes: 0 %
Lymphocytes Relative: 17 %
Lymphs Abs: 0.5 10*3/uL — ABNORMAL LOW (ref 0.7–4.0)
MCH: 33.1 pg (ref 26.0–34.0)
MCHC: 33.4 g/dL (ref 30.0–36.0)
MCV: 99.1 fL (ref 80.0–100.0)
Monocytes Absolute: 0.5 10*3/uL (ref 0.1–1.0)
Monocytes Relative: 15 %
NEUTROS ABS: 1.9 10*3/uL (ref 1.7–7.7)
Neutrophils Relative %: 63 %
Platelet Count: 213 10*3/uL (ref 150–400)
RBC: 3.5 MIL/uL — ABNORMAL LOW (ref 3.87–5.11)
RDW: 13.2 % (ref 11.5–15.5)
WBC Count: 3.1 10*3/uL — ABNORMAL LOW (ref 4.0–10.5)
nRBC: 0 % (ref 0.0–0.2)

## 2019-01-30 MED ORDER — SODIUM CHLORIDE 0.9 % IV SOLN
Freq: Once | INTRAVENOUS | Status: AC
  Administered 2019-01-30: 16:00:00 via INTRAVENOUS
  Filled 2019-01-30: qty 250

## 2019-01-30 MED ORDER — FAMOTIDINE IN NACL 20-0.9 MG/50ML-% IV SOLN
INTRAVENOUS | Status: AC
  Filled 2019-01-30: qty 50

## 2019-01-30 MED ORDER — SODIUM CHLORIDE 0.9% FLUSH
10.0000 mL | INTRAVENOUS | Status: DC | PRN
  Administered 2019-01-30: 10 mL
  Filled 2019-01-30: qty 10

## 2019-01-30 MED ORDER — SODIUM CHLORIDE 0.9 % IV SOLN
80.0000 mg/m2 | Freq: Once | INTRAVENOUS | Status: AC
  Administered 2019-01-30: 180 mg via INTRAVENOUS
  Filled 2019-01-30: qty 30

## 2019-01-30 MED ORDER — FAMOTIDINE IN NACL 20-0.9 MG/50ML-% IV SOLN
20.0000 mg | Freq: Once | INTRAVENOUS | Status: AC
  Administered 2019-01-30: 20 mg via INTRAVENOUS

## 2019-01-30 MED ORDER — HEPARIN SOD (PORK) LOCK FLUSH 100 UNIT/ML IV SOLN
500.0000 [IU] | Freq: Once | INTRAVENOUS | Status: AC | PRN
  Administered 2019-01-30: 500 [IU]
  Filled 2019-01-30: qty 5

## 2019-01-30 MED ORDER — DIPHENHYDRAMINE HCL 50 MG/ML IJ SOLN
25.0000 mg | Freq: Once | INTRAMUSCULAR | Status: AC
  Administered 2019-01-30: 25 mg via INTRAVENOUS

## 2019-01-30 MED ORDER — DEXAMETHASONE SODIUM PHOSPHATE 10 MG/ML IJ SOLN
INTRAMUSCULAR | Status: AC
  Filled 2019-01-30: qty 1

## 2019-01-30 MED ORDER — DIPHENHYDRAMINE HCL 50 MG/ML IJ SOLN
INTRAMUSCULAR | Status: AC
  Filled 2019-01-30: qty 1

## 2019-01-30 MED ORDER — DEXAMETHASONE SODIUM PHOSPHATE 10 MG/ML IJ SOLN
10.0000 mg | Freq: Once | INTRAMUSCULAR | Status: AC
  Administered 2019-01-30: 10 mg via INTRAVENOUS

## 2019-01-30 NOTE — Patient Instructions (Signed)
[facility] Discharge Instructions for Patients Receiving Chemotherapy  Today you received the following chemotherapy agents :  Taxol.  To help prevent nausea and vomiting after your treatment, we encourage you to take your nausea medication as prescribed.   If you develop nausea and vomiting that is not controlled by your nausea medication, call the clinic.   BELOW ARE SYMPTOMS THAT SHOULD BE REPORTED IMMEDIATELY:  *FEVER GREATER THAN 100.5 F  *CHILLS WITH OR WITHOUT FEVER  NAUSEA AND VOMITING THAT IS NOT CONTROLLED WITH YOUR NAUSEA MEDICATION  *UNUSUAL SHORTNESS OF BREATH  *UNUSUAL BRUISING OR BLEEDING  TENDERNESS IN MOUTH AND THROAT WITH OR WITHOUT PRESENCE OF ULCERS  *URINARY PROBLEMS  *BOWEL PROBLEMS  UNUSUAL RASH Items with * indicate a potential emergency and should be followed up as soon as possible.  Feel free to call the clinic should you have any questions or concerns. The clinic phone number is [phone].  Please show the CHEMO ALERT CARD at check-in to the Emergency Department and triage nurse.   

## 2019-01-31 NOTE — Progress Notes (Signed)
Radiation Oncology         [phone] ________________________________  Name: Jennifer Carroll        MRN: [id number]  Date of Service: 01/30/2019 DOB: 1966-01-04  XB:JYNW, Jennifer May, MD  Wilber Bihari Cornett*     REFERRING PHYSICIAN: Wilber Bihari Cornett*   DIAGNOSIS: The encounter diagnosis was Malignant neoplasm of upper-outer quadrant of right breast in female, estrogen receptor negative ([place]).   HISTORY OF PRESENT ILLNESS: Jennifer Carroll is a 53 y.o. female originnaly seen in the multidisciplinary breast clinic for a new diagnosis of right breast cancer. The patient was noted to have a screening detected abnormality in the right breast and on ultrasound in the upper outer quadrant, at 10:00, there was a 1.4 x 1 x 1 cm mass. Her axilla was negative for adenopathy. She underwent a biopsy of the lesion on 07/13/18 revealing a grade 2, invasive ductal carcinoma, triple negative. She underwent lumpectomy and sentinel node biopsy of the right breast on 08/07/2018 and final pathology revealed a grade 2 invasive ductal carcinoma measuring 15 mm, with DCIS. Her margins were negative and 3 sampled nodes were negative. She went on to begin systemic therapy and started chemiotherapy on 09/05/18 and completes her 12 th cycle of taxol today. She comes today for follow up.    PREVIOUS RADIATION THERAPY: No   PAST MEDICAL HISTORY:  Past Medical History:  Diagnosis Date  . AMA (advanced maternal age) multigravida 35+ 03/28/05   with no amniocentesis  . 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 Cancer [facility])    Right breast   . Family history of pancreatic cancer   . H/O infertility   . H/O menorrhagia   . History of chicken pox   . History of measles, mumps, or rubella   . Hyperlipidemia   . Hypertension   . Low iron   . Menses, irregular   . 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   [name]        PAST SURGICAL HISTORY: Past Surgical History:  Procedure Laterality Date  . BREAST LUMPECTOMY WITH RADIOACTIVE SEED AND SENTINEL LYMPH NODE BIOPSY Right 08/07/2018   Procedure: INJECT BLUE DYE RIGHT BREAST,RIGHT BREAST LUMPECTOMY WITH RADIOACTIVE SEED AND RIGHT AXILLARY DEEP SENTINEL LYMPH NODE BIOPSY;  Surgeon: Fanny Skates, MD;  Location: [place];  Service: General;  Laterality: Right;  . EYE SURGERY     left eye at age 64  . head surgery     to cover hole in head at age of  30 yrs young  . PORTACATH PLACEMENT Left 08/07/2018   Procedure: INSERTION PORT-A-CATH;  Surgeon: Fanny Skates, MD;  Location: [facility];  Service: General;  Laterality: Left;  . WISDOM TOOTH EXTRACTION  2001     FAMILY HISTORY:  Family History  Problem Relation Age of Onset  . Hypertension Mother   . Kidney disease Mother   . Heart disease Father   . Hypertension Brother   . Kidney failure Maternal Grandfather   . Pancreatic cancer Cousin        dx >50     SOCIAL HISTORY:  reports that she has never smoked. She has never used smokeless tobacco. She reports previous alcohol use. She reports that she does not use drugs. The patient is married and lives in [place].   ALLERGIES: Patient  has no known allergies.   MEDICATIONS:  Current Outpatient Medications  Medication Sig Dispense Refill  . gabapentin (NEURONTIN) 300 MG capsule Take 1 capsule (300 mg total) by mouth at bedtime. 21 capsule 0  . ibuprofen (ADVIL,MOTRIN) 200 MG tablet Take 200 mg by mouth every 6 (six) hours as needed. Ibuprofen 200 mg 2 tablets As needed.    Marland Kitchen ketoconazole (NIZORAL) 2 % cream Apply 1 application topically daily. 15 g 0  . LOSARTAN POTASSIUM PO Take 50 mg by mouth daily.    . metroNIDAZOLE (METROGEL) 1 % gel Apply topically daily. 45 g 0  . Multiple Vitamin (MULTI-VITAMIN DAILY PO) Take by mouth.    . nystatin  (MYCOSTATIN/NYSTOP) powder Apply topically 4 (four) times daily. On affected area. (Breast rash and toes ) 30 g 1  . simvastatin (ZOCOR) 20 MG tablet Take 20 mg by mouth every evening.    . triamcinolone (KENALOG) 0.025 % ointment Apply 1 application topically 2 (two) times daily. 30 g 0  . norethindrone (CAMILA) 0.35 MG tablet Take 1 tablet (0.35 mg total) by mouth daily. 3 Package 3   No current facility-administered medications for this encounter.      REVIEW OF SYSTEMS: On review of systems, the patient reports that she is doing well overall. She has only mild neuropathy in her fingertips. She denies any chest pain, shortness of breath, cough, fevers, chills, night sweats, unintended weight changes. She denies any bowel or bladder disturbances, and denies abdominal pain, nausea or vomiting. She denies any new musculoskeletal or joint aches or pains. A complete review of systems is obtained and is otherwise negative.     PHYSICAL EXAM:  Wt Readings from Last 3 Encounters:  01/30/19 232 lb 6.4 oz (105.4 kg)  01/23/19 231 lb 14.4 oz (105.2 kg)  01/09/19 230 lb 14.4 oz (104.7 kg)   Temp Readings from Last 3 Encounters:  01/30/19 97.6 F (36.4 C) (Oral)  01/23/19 98 F (36.7 C) (Oral)  01/16/19 97.8 F (36.6 C) (Oral)   BP Readings from Last 3 Encounters:  01/30/19 139/74  01/23/19 123/86  01/16/19 129/85   Pulse Readings from Last 3 Encounters:  01/30/19 71  01/23/19 79  01/16/19 84     In general this is a well appearing caucasian female in no acute distress. She is alert and oriented x4 and appropriate throughout the examination. HEENT reveals that the patient is normocephalic, atraumatic. EOMs are intact.  Skin is intact without any evidence of gross lesions. Cardiopulmonary assessment is negative for acute distress and she exhibits normal effort. Breast exam is deferred.  ECOG = 0  0 - Asymptomatic (Fully active, able to carry on all predisease activities without  restriction)  1 - Symptomatic but completely ambulatory (Restricted in physically strenuous activity but ambulatory and able to carry out work of a light or sedentary nature. For example, light housework, office work)  2 - Symptomatic, <50% in bed during the day (Ambulatory and capable of all self care but unable to carry out any work activities. Up and about more than 50% of waking hours)  3 - Symptomatic, >50% in bed, but not bedbound (Capable of only limited self-care, confined to bed or chair 50% or more of waking hours)  4 - Bedbound (Completely disabled. Cannot carry on any self-care. Totally confined to bed or chair)  5 - Death   Eustace Pen MM, Creech RH, Tormey DC, et al. [phone]). "Toxicity and response criteria of the [facility] Group".  [place] Oncol. 5 (6): 649-55    LABORATORY DATA:  Lab Results  Component Value Date   WBC 3.1 (L) 01/30/2019   HGB 11.6 (L) 01/30/2019   HCT 34.7 (L) 01/30/2019   MCV 99.1 01/30/2019   PLT 213 01/30/2019   Lab Results  Component Value Date   NA 141 01/30/2019   K 4.0 01/30/2019   CL 107 01/30/2019   CO2 25 01/30/2019   Lab Results  Component Value Date   ALT 22 01/30/2019   AST 21 01/30/2019   ALKPHOS 89 01/30/2019   BILITOT 0.4 01/30/2019      RADIOGRAPHY: No results found.     IMPRESSION/PLAN: 1. 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 We met today and reviewed the nature of triple negative invasive breast disease. She is in the midst of her last cycle of taxol that will be given today. She has been doing well since her surgery, and in a few weeks would be ready to proceed with adjuvant radiotherapy.  We discussed the risks, benefits, short, and long term effects of radiotherapy, and the patient is interested in proceeding. We discussed the delivery and logistics of radiotherapy and Dr. [name] would recommend a course of 6 1/2 weeks of radiotherapy. She will  proceed with simulation and be contacted to coordinate this in about 3-4 weeks. Written consent is obtained and placed in the chart, a copy was provided to the patient.    In a visit lasting 25 minutes, greater than 50% of the time was spent face to face discussing her case, and coordinating the patient's care.     Carola Rhine, PAC

## 2019-02-07 DIAGNOSIS — B351 Tinea unguium: Secondary | ICD-10-CM | POA: Diagnosis not present

## 2019-02-07 DIAGNOSIS — M79675 Pain in left toe(s): Secondary | ICD-10-CM | POA: Diagnosis not present

## 2019-02-07 DIAGNOSIS — M79674 Pain in right toe(s): Secondary | ICD-10-CM | POA: Diagnosis not present

## 2019-02-11 ENCOUNTER — Encounter: Payer: Self-pay | Admitting: Hematology and Oncology

## 2019-02-25 ENCOUNTER — Telehealth: Payer: Self-pay | Admitting: Radiation Oncology

## 2019-02-25 DIAGNOSIS — Z853 Personal history of malignant neoplasm of breast: Secondary | ICD-10-CM | POA: Diagnosis not present

## 2019-02-25 DIAGNOSIS — Z452 Encounter for adjustment and management of vascular access device: Secondary | ICD-10-CM | POA: Diagnosis not present

## 2019-02-25 NOTE — Telephone Encounter (Signed)
Opened in error

## 2019-02-26 ENCOUNTER — Other Ambulatory Visit: Payer: Self-pay

## 2019-02-26 ENCOUNTER — Ambulatory Visit
Admission: RE | Admit: 2019-02-26 | Discharge: 2019-02-26 | Disposition: A | Discharge status: Home / Self Care | Payer: BLUE CROSS/BLUE SHIELD | Source: Ambulatory Visit | Attending: Radiation Oncology | Admitting: Radiation Oncology

## 2019-02-26 DIAGNOSIS — Z79899 Other long term (current) drug therapy: Secondary | ICD-10-CM | POA: Insufficient documentation

## 2019-02-26 DIAGNOSIS — Z808 Family history of malignant neoplasm of other organs or systems: Secondary | ICD-10-CM | POA: Insufficient documentation

## 2019-02-26 DIAGNOSIS — C50411 Malignant neoplasm of upper-outer quadrant of right female breast: Secondary | ICD-10-CM | POA: Insufficient documentation

## 2019-02-26 DIAGNOSIS — Z171 Estrogen receptor negative status [ER-]: Secondary | ICD-10-CM | POA: Diagnosis not present

## 2019-02-26 DIAGNOSIS — I10 Essential (primary) hypertension: Secondary | ICD-10-CM | POA: Diagnosis not present

## 2019-02-26 DIAGNOSIS — E785 Hyperlipidemia, unspecified: Secondary | ICD-10-CM | POA: Diagnosis not present

## 2019-02-26 DIAGNOSIS — Z510 Encounter for antineoplastic radiation therapy: Secondary | ICD-10-CM | POA: Insufficient documentation

## 2019-03-04 DIAGNOSIS — Z808 Family history of malignant neoplasm of other organs or systems: Secondary | ICD-10-CM | POA: Diagnosis not present

## 2019-03-04 DIAGNOSIS — I10 Essential (primary) hypertension: Secondary | ICD-10-CM | POA: Diagnosis not present

## 2019-03-04 DIAGNOSIS — Z171 Estrogen receptor negative status [ER-]: Secondary | ICD-10-CM | POA: Diagnosis not present

## 2019-03-04 DIAGNOSIS — Z510 Encounter for antineoplastic radiation therapy: Secondary | ICD-10-CM | POA: Diagnosis not present

## 2019-03-04 DIAGNOSIS — E785 Hyperlipidemia, unspecified: Secondary | ICD-10-CM | POA: Diagnosis not present

## 2019-03-04 DIAGNOSIS — Z79899 Other long term (current) drug therapy: Secondary | ICD-10-CM | POA: Diagnosis not present

## 2019-03-04 DIAGNOSIS — C50411 Malignant neoplasm of upper-outer quadrant of right female breast: Secondary | ICD-10-CM | POA: Diagnosis not present

## 2019-03-05 ENCOUNTER — Ambulatory Visit
Admission: RE | Admit: 2019-03-05 | Discharge: 2019-03-05 | Disposition: A | Discharge status: Home / Self Care | Payer: BLUE CROSS/BLUE SHIELD | Source: Ambulatory Visit | Attending: Radiation Oncology | Admitting: Radiation Oncology

## 2019-03-05 ENCOUNTER — Other Ambulatory Visit: Payer: Self-pay

## 2019-03-05 DIAGNOSIS — Z79899 Other long term (current) drug therapy: Secondary | ICD-10-CM | POA: Diagnosis not present

## 2019-03-05 DIAGNOSIS — Z808 Family history of malignant neoplasm of other organs or systems: Secondary | ICD-10-CM | POA: Diagnosis not present

## 2019-03-05 DIAGNOSIS — Z171 Estrogen receptor negative status [ER-]: Principal | ICD-10-CM

## 2019-03-05 DIAGNOSIS — Z510 Encounter for antineoplastic radiation therapy: Secondary | ICD-10-CM | POA: Diagnosis not present

## 2019-03-05 DIAGNOSIS — E785 Hyperlipidemia, unspecified: Secondary | ICD-10-CM | POA: Diagnosis not present

## 2019-03-05 DIAGNOSIS — I10 Essential (primary) hypertension: Secondary | ICD-10-CM | POA: Diagnosis not present

## 2019-03-05 DIAGNOSIS — C50411 Malignant neoplasm of upper-outer quadrant of right female breast: Secondary | ICD-10-CM

## 2019-03-05 MED ORDER — RADIAPLEXRX EX GEL
Freq: Once | CUTANEOUS | Status: AC
  Administered 2019-03-05: 13:00:00 via TOPICAL

## 2019-03-05 MED ORDER — ALRA NON-METALLIC DEODORANT (RAD-ONC)
1.0000 "application " | Freq: Once | TOPICAL | Status: AC
  Administered 2019-03-05: 1 via TOPICAL

## 2019-03-05 NOTE — Progress Notes (Signed)
Pt here for patient teaching.  Pt given Radiation and You booklet, skin care instructions, Alra deodorant and Radiaplex gel.  Reviewed areas of pertinence such as fatigue, hair loss, skin changes, breast tenderness and breast swelling . Pt able to give teach back of to pat skin and use unscented/gentle soap,apply Radiaplex bid, avoid applying anything to skin within 4 hours of treatment, avoid wearing an under wire bra and to use an electric razor if they must shave. Pt verbalizes understanding of information given and will contact nursing with any questions or concerns.     Iden Stripling M. Zaylie Gisler RN, BSN      

## 2019-03-06 ENCOUNTER — Other Ambulatory Visit: Payer: Self-pay

## 2019-03-06 ENCOUNTER — Ambulatory Visit
Admission: RE | Admit: 2019-03-06 | Discharge: 2019-03-06 | Disposition: A | Discharge status: Home / Self Care | Payer: BLUE CROSS/BLUE SHIELD | Source: Ambulatory Visit | Attending: Radiation Oncology | Admitting: Radiation Oncology

## 2019-03-06 DIAGNOSIS — E785 Hyperlipidemia, unspecified: Secondary | ICD-10-CM | POA: Diagnosis not present

## 2019-03-06 DIAGNOSIS — C50411 Malignant neoplasm of upper-outer quadrant of right female breast: Secondary | ICD-10-CM | POA: Diagnosis not present

## 2019-03-06 DIAGNOSIS — Z510 Encounter for antineoplastic radiation therapy: Secondary | ICD-10-CM | POA: Diagnosis not present

## 2019-03-06 DIAGNOSIS — Z79899 Other long term (current) drug therapy: Secondary | ICD-10-CM | POA: Diagnosis not present

## 2019-03-06 DIAGNOSIS — Z171 Estrogen receptor negative status [ER-]: Secondary | ICD-10-CM | POA: Diagnosis not present

## 2019-03-06 DIAGNOSIS — Z808 Family history of malignant neoplasm of other organs or systems: Secondary | ICD-10-CM | POA: Diagnosis not present

## 2019-03-06 DIAGNOSIS — I10 Essential (primary) hypertension: Secondary | ICD-10-CM | POA: Diagnosis not present

## 2019-03-07 ENCOUNTER — Other Ambulatory Visit: Payer: Self-pay

## 2019-03-07 ENCOUNTER — Ambulatory Visit
Admission: RE | Admit: 2019-03-07 | Discharge: 2019-03-07 | Disposition: A | Discharge status: Home / Self Care | Payer: BLUE CROSS/BLUE SHIELD | Source: Ambulatory Visit | Attending: Radiation Oncology | Admitting: Radiation Oncology

## 2019-03-07 DIAGNOSIS — Z79899 Other long term (current) drug therapy: Secondary | ICD-10-CM | POA: Diagnosis not present

## 2019-03-07 DIAGNOSIS — Z808 Family history of malignant neoplasm of other organs or systems: Secondary | ICD-10-CM | POA: Diagnosis not present

## 2019-03-07 DIAGNOSIS — C50411 Malignant neoplasm of upper-outer quadrant of right female breast: Secondary | ICD-10-CM | POA: Diagnosis not present

## 2019-03-07 DIAGNOSIS — I10 Essential (primary) hypertension: Secondary | ICD-10-CM | POA: Diagnosis not present

## 2019-03-07 DIAGNOSIS — Z510 Encounter for antineoplastic radiation therapy: Secondary | ICD-10-CM | POA: Diagnosis not present

## 2019-03-07 DIAGNOSIS — Z171 Estrogen receptor negative status [ER-]: Secondary | ICD-10-CM | POA: Diagnosis not present

## 2019-03-07 DIAGNOSIS — E785 Hyperlipidemia, unspecified: Secondary | ICD-10-CM | POA: Diagnosis not present

## 2019-03-08 ENCOUNTER — Ambulatory Visit
Admission: RE | Admit: 2019-03-08 | Discharge: 2019-03-08 | Disposition: A | Discharge status: Home / Self Care | Payer: BLUE CROSS/BLUE SHIELD | Source: Ambulatory Visit | Attending: Radiation Oncology | Admitting: Radiation Oncology

## 2019-03-08 ENCOUNTER — Other Ambulatory Visit: Payer: Self-pay

## 2019-03-08 DIAGNOSIS — Z808 Family history of malignant neoplasm of other organs or systems: Secondary | ICD-10-CM | POA: Diagnosis not present

## 2019-03-08 DIAGNOSIS — Z171 Estrogen receptor negative status [ER-]: Secondary | ICD-10-CM | POA: Diagnosis not present

## 2019-03-08 DIAGNOSIS — Z79899 Other long term (current) drug therapy: Secondary | ICD-10-CM | POA: Diagnosis not present

## 2019-03-08 DIAGNOSIS — I10 Essential (primary) hypertension: Secondary | ICD-10-CM | POA: Diagnosis not present

## 2019-03-08 DIAGNOSIS — Z510 Encounter for antineoplastic radiation therapy: Secondary | ICD-10-CM | POA: Diagnosis not present

## 2019-03-08 DIAGNOSIS — E785 Hyperlipidemia, unspecified: Secondary | ICD-10-CM | POA: Diagnosis not present

## 2019-03-08 DIAGNOSIS — C50411 Malignant neoplasm of upper-outer quadrant of right female breast: Secondary | ICD-10-CM | POA: Diagnosis not present

## 2019-03-11 ENCOUNTER — Other Ambulatory Visit: Payer: Self-pay

## 2019-03-11 ENCOUNTER — Ambulatory Visit
Admission: RE | Admit: 2019-03-11 | Discharge: 2019-03-11 | Disposition: A | Discharge status: Home / Self Care | Payer: BLUE CROSS/BLUE SHIELD | Source: Ambulatory Visit | Attending: Radiation Oncology | Admitting: Radiation Oncology

## 2019-03-11 DIAGNOSIS — Z510 Encounter for antineoplastic radiation therapy: Secondary | ICD-10-CM | POA: Diagnosis not present

## 2019-03-11 DIAGNOSIS — Z171 Estrogen receptor negative status [ER-]: Secondary | ICD-10-CM | POA: Diagnosis not present

## 2019-03-11 DIAGNOSIS — Z79899 Other long term (current) drug therapy: Secondary | ICD-10-CM | POA: Diagnosis not present

## 2019-03-11 DIAGNOSIS — I10 Essential (primary) hypertension: Secondary | ICD-10-CM | POA: Diagnosis not present

## 2019-03-11 DIAGNOSIS — C50411 Malignant neoplasm of upper-outer quadrant of right female breast: Secondary | ICD-10-CM | POA: Diagnosis not present

## 2019-03-11 DIAGNOSIS — E785 Hyperlipidemia, unspecified: Secondary | ICD-10-CM | POA: Diagnosis not present

## 2019-03-11 DIAGNOSIS — Z808 Family history of malignant neoplasm of other organs or systems: Secondary | ICD-10-CM | POA: Diagnosis not present

## 2019-03-12 ENCOUNTER — Ambulatory Visit
Admission: RE | Admit: 2019-03-12 | Discharge: 2019-03-12 | Disposition: A | Discharge status: Home / Self Care | Payer: BLUE CROSS/BLUE SHIELD | Source: Ambulatory Visit | Attending: Radiation Oncology | Admitting: Radiation Oncology

## 2019-03-12 ENCOUNTER — Other Ambulatory Visit: Payer: Self-pay

## 2019-03-12 DIAGNOSIS — E785 Hyperlipidemia, unspecified: Secondary | ICD-10-CM | POA: Diagnosis not present

## 2019-03-12 DIAGNOSIS — C50411 Malignant neoplasm of upper-outer quadrant of right female breast: Secondary | ICD-10-CM | POA: Diagnosis not present

## 2019-03-12 DIAGNOSIS — Z808 Family history of malignant neoplasm of other organs or systems: Secondary | ICD-10-CM | POA: Diagnosis not present

## 2019-03-12 DIAGNOSIS — Z510 Encounter for antineoplastic radiation therapy: Secondary | ICD-10-CM | POA: Diagnosis not present

## 2019-03-12 DIAGNOSIS — I10 Essential (primary) hypertension: Secondary | ICD-10-CM | POA: Diagnosis not present

## 2019-03-12 DIAGNOSIS — Z171 Estrogen receptor negative status [ER-]: Secondary | ICD-10-CM | POA: Diagnosis not present

## 2019-03-12 DIAGNOSIS — Z79899 Other long term (current) drug therapy: Secondary | ICD-10-CM | POA: Diagnosis not present

## 2019-03-13 ENCOUNTER — Other Ambulatory Visit: Payer: Self-pay

## 2019-03-13 ENCOUNTER — Ambulatory Visit
Admission: RE | Admit: 2019-03-13 | Discharge: 2019-03-13 | Disposition: A | Discharge status: Home / Self Care | Payer: BLUE CROSS/BLUE SHIELD | Source: Ambulatory Visit | Attending: Radiation Oncology | Admitting: Radiation Oncology

## 2019-03-13 DIAGNOSIS — E785 Hyperlipidemia, unspecified: Secondary | ICD-10-CM

## 2019-03-13 DIAGNOSIS — Z79899 Other long term (current) drug therapy: Secondary | ICD-10-CM | POA: Insufficient documentation

## 2019-03-13 DIAGNOSIS — I10 Essential (primary) hypertension: Secondary | ICD-10-CM | POA: Insufficient documentation

## 2019-03-13 DIAGNOSIS — Z510 Encounter for antineoplastic radiation therapy: Secondary | ICD-10-CM | POA: Insufficient documentation

## 2019-03-13 DIAGNOSIS — Z171 Estrogen receptor negative status [ER-]: Secondary | ICD-10-CM | POA: Insufficient documentation

## 2019-03-13 DIAGNOSIS — Z808 Family history of malignant neoplasm of other organs or systems: Secondary | ICD-10-CM

## 2019-03-13 DIAGNOSIS — C50411 Malignant neoplasm of upper-outer quadrant of right female breast: Secondary | ICD-10-CM

## 2019-03-13 DIAGNOSIS — Z9221 Personal history of antineoplastic chemotherapy: Secondary | ICD-10-CM | POA: Diagnosis not present

## 2019-03-13 DIAGNOSIS — Z923 Personal history of irradiation: Secondary | ICD-10-CM | POA: Diagnosis not present

## 2019-03-14 ENCOUNTER — Other Ambulatory Visit: Payer: Self-pay

## 2019-03-14 ENCOUNTER — Ambulatory Visit
Admission: RE | Admit: 2019-03-14 | Discharge: 2019-03-14 | Disposition: A | Discharge status: Home / Self Care | Payer: BLUE CROSS/BLUE SHIELD | Source: Ambulatory Visit | Attending: Radiation Oncology | Admitting: Radiation Oncology

## 2019-03-14 DIAGNOSIS — Z79899 Other long term (current) drug therapy: Secondary | ICD-10-CM | POA: Diagnosis not present

## 2019-03-14 DIAGNOSIS — Z9221 Personal history of antineoplastic chemotherapy: Secondary | ICD-10-CM | POA: Diagnosis not present

## 2019-03-14 DIAGNOSIS — C50411 Malignant neoplasm of upper-outer quadrant of right female breast: Secondary | ICD-10-CM | POA: Diagnosis not present

## 2019-03-14 DIAGNOSIS — Z923 Personal history of irradiation: Secondary | ICD-10-CM | POA: Diagnosis not present

## 2019-03-14 DIAGNOSIS — Z171 Estrogen receptor negative status [ER-]: Secondary | ICD-10-CM | POA: Diagnosis not present

## 2019-03-15 ENCOUNTER — Other Ambulatory Visit: Payer: Self-pay

## 2019-03-15 ENCOUNTER — Ambulatory Visit
Admission: RE | Admit: 2019-03-15 | Discharge: 2019-03-15 | Disposition: A | Discharge status: Home / Self Care | Payer: BLUE CROSS/BLUE SHIELD | Source: Ambulatory Visit | Attending: Radiation Oncology | Admitting: Radiation Oncology

## 2019-03-15 DIAGNOSIS — Z79899 Other long term (current) drug therapy: Secondary | ICD-10-CM | POA: Diagnosis not present

## 2019-03-15 DIAGNOSIS — Z9221 Personal history of antineoplastic chemotherapy: Secondary | ICD-10-CM | POA: Diagnosis not present

## 2019-03-15 DIAGNOSIS — C50411 Malignant neoplasm of upper-outer quadrant of right female breast: Secondary | ICD-10-CM | POA: Diagnosis not present

## 2019-03-15 DIAGNOSIS — Z923 Personal history of irradiation: Secondary | ICD-10-CM | POA: Diagnosis not present

## 2019-03-15 DIAGNOSIS — Z171 Estrogen receptor negative status [ER-]: Secondary | ICD-10-CM | POA: Diagnosis not present

## 2019-03-18 ENCOUNTER — Ambulatory Visit
Admission: RE | Admit: 2019-03-18 | Discharge: 2019-03-18 | Disposition: A | Discharge status: Home / Self Care | Payer: BLUE CROSS/BLUE SHIELD | Source: Ambulatory Visit | Attending: Radiation Oncology | Admitting: Radiation Oncology

## 2019-03-18 ENCOUNTER — Other Ambulatory Visit: Payer: Self-pay

## 2019-03-18 DIAGNOSIS — Z79899 Other long term (current) drug therapy: Secondary | ICD-10-CM | POA: Diagnosis not present

## 2019-03-18 DIAGNOSIS — Z923 Personal history of irradiation: Secondary | ICD-10-CM | POA: Diagnosis not present

## 2019-03-18 DIAGNOSIS — C50411 Malignant neoplasm of upper-outer quadrant of right female breast: Secondary | ICD-10-CM | POA: Diagnosis not present

## 2019-03-18 DIAGNOSIS — Z171 Estrogen receptor negative status [ER-]: Secondary | ICD-10-CM | POA: Diagnosis not present

## 2019-03-18 DIAGNOSIS — Z9221 Personal history of antineoplastic chemotherapy: Secondary | ICD-10-CM | POA: Diagnosis not present

## 2019-03-19 ENCOUNTER — Other Ambulatory Visit: Payer: Self-pay

## 2019-03-19 ENCOUNTER — Ambulatory Visit
Admission: RE | Admit: 2019-03-19 | Discharge: 2019-03-19 | Disposition: A | Discharge status: Home / Self Care | Payer: BLUE CROSS/BLUE SHIELD | Source: Ambulatory Visit | Attending: Radiation Oncology | Admitting: Radiation Oncology

## 2019-03-19 DIAGNOSIS — Z9221 Personal history of antineoplastic chemotherapy: Secondary | ICD-10-CM | POA: Diagnosis not present

## 2019-03-19 DIAGNOSIS — Z79899 Other long term (current) drug therapy: Secondary | ICD-10-CM | POA: Diagnosis not present

## 2019-03-19 DIAGNOSIS — C50411 Malignant neoplasm of upper-outer quadrant of right female breast: Secondary | ICD-10-CM | POA: Diagnosis not present

## 2019-03-19 DIAGNOSIS — Z923 Personal history of irradiation: Secondary | ICD-10-CM | POA: Diagnosis not present

## 2019-03-19 DIAGNOSIS — Z171 Estrogen receptor negative status [ER-]: Secondary | ICD-10-CM | POA: Diagnosis not present

## 2019-03-20 ENCOUNTER — Other Ambulatory Visit: Payer: Self-pay

## 2019-03-20 ENCOUNTER — Ambulatory Visit
Admission: RE | Admit: 2019-03-20 | Discharge: 2019-03-20 | Disposition: A | Discharge status: Home / Self Care | Payer: BLUE CROSS/BLUE SHIELD | Source: Ambulatory Visit | Attending: Radiation Oncology | Admitting: Radiation Oncology

## 2019-03-20 DIAGNOSIS — Z79899 Other long term (current) drug therapy: Secondary | ICD-10-CM | POA: Diagnosis not present

## 2019-03-20 DIAGNOSIS — Z9221 Personal history of antineoplastic chemotherapy: Secondary | ICD-10-CM | POA: Diagnosis not present

## 2019-03-20 DIAGNOSIS — Z171 Estrogen receptor negative status [ER-]: Secondary | ICD-10-CM | POA: Diagnosis not present

## 2019-03-20 DIAGNOSIS — Z923 Personal history of irradiation: Secondary | ICD-10-CM | POA: Diagnosis not present

## 2019-03-20 DIAGNOSIS — C50411 Malignant neoplasm of upper-outer quadrant of right female breast: Secondary | ICD-10-CM | POA: Diagnosis not present

## 2019-03-21 ENCOUNTER — Other Ambulatory Visit: Payer: Self-pay

## 2019-03-21 ENCOUNTER — Ambulatory Visit
Admission: RE | Admit: 2019-03-21 | Discharge: 2019-03-21 | Disposition: A | Discharge status: Home / Self Care | Payer: BLUE CROSS/BLUE SHIELD | Source: Ambulatory Visit | Attending: Radiation Oncology | Admitting: Radiation Oncology

## 2019-03-21 DIAGNOSIS — Z9221 Personal history of antineoplastic chemotherapy: Secondary | ICD-10-CM | POA: Diagnosis not present

## 2019-03-21 DIAGNOSIS — C50411 Malignant neoplasm of upper-outer quadrant of right female breast: Secondary | ICD-10-CM | POA: Diagnosis not present

## 2019-03-21 DIAGNOSIS — Z171 Estrogen receptor negative status [ER-]: Secondary | ICD-10-CM | POA: Diagnosis not present

## 2019-03-21 DIAGNOSIS — Z79899 Other long term (current) drug therapy: Secondary | ICD-10-CM | POA: Diagnosis not present

## 2019-03-21 DIAGNOSIS — Z923 Personal history of irradiation: Secondary | ICD-10-CM | POA: Diagnosis not present

## 2019-03-22 ENCOUNTER — Other Ambulatory Visit: Payer: Self-pay

## 2019-03-22 ENCOUNTER — Ambulatory Visit
Admission: RE | Admit: 2019-03-22 | Discharge: 2019-03-22 | Disposition: A | Discharge status: Home / Self Care | Payer: BLUE CROSS/BLUE SHIELD | Source: Ambulatory Visit | Attending: Radiation Oncology | Admitting: Radiation Oncology

## 2019-03-22 DIAGNOSIS — Z923 Personal history of irradiation: Secondary | ICD-10-CM | POA: Diagnosis not present

## 2019-03-22 DIAGNOSIS — C50411 Malignant neoplasm of upper-outer quadrant of right female breast: Secondary | ICD-10-CM | POA: Diagnosis not present

## 2019-03-22 DIAGNOSIS — Z79899 Other long term (current) drug therapy: Secondary | ICD-10-CM | POA: Diagnosis not present

## 2019-03-22 DIAGNOSIS — Z9221 Personal history of antineoplastic chemotherapy: Secondary | ICD-10-CM | POA: Diagnosis not present

## 2019-03-22 DIAGNOSIS — Z171 Estrogen receptor negative status [ER-]: Secondary | ICD-10-CM | POA: Diagnosis not present

## 2019-03-25 ENCOUNTER — Ambulatory Visit
Admission: RE | Admit: 2019-03-25 | Discharge: 2019-03-25 | Disposition: A | Discharge status: Home / Self Care | Payer: BLUE CROSS/BLUE SHIELD | Source: Ambulatory Visit | Attending: Radiation Oncology | Admitting: Radiation Oncology

## 2019-03-25 ENCOUNTER — Other Ambulatory Visit: Payer: Self-pay

## 2019-03-25 DIAGNOSIS — C50411 Malignant neoplasm of upper-outer quadrant of right female breast: Secondary | ICD-10-CM | POA: Diagnosis not present

## 2019-03-25 DIAGNOSIS — Z79899 Other long term (current) drug therapy: Secondary | ICD-10-CM | POA: Diagnosis not present

## 2019-03-25 DIAGNOSIS — Z923 Personal history of irradiation: Secondary | ICD-10-CM | POA: Diagnosis not present

## 2019-03-25 DIAGNOSIS — Z171 Estrogen receptor negative status [ER-]: Secondary | ICD-10-CM | POA: Diagnosis not present

## 2019-03-25 DIAGNOSIS — Z9221 Personal history of antineoplastic chemotherapy: Secondary | ICD-10-CM | POA: Diagnosis not present

## 2019-03-26 ENCOUNTER — Other Ambulatory Visit: Payer: Self-pay

## 2019-03-26 ENCOUNTER — Ambulatory Visit
Admission: RE | Admit: 2019-03-26 | Discharge: 2019-03-26 | Disposition: A | Discharge status: Home / Self Care | Payer: BLUE CROSS/BLUE SHIELD | Source: Ambulatory Visit | Attending: Radiation Oncology | Admitting: Radiation Oncology

## 2019-03-26 DIAGNOSIS — Z79899 Other long term (current) drug therapy: Secondary | ICD-10-CM | POA: Diagnosis not present

## 2019-03-26 DIAGNOSIS — Z9221 Personal history of antineoplastic chemotherapy: Secondary | ICD-10-CM | POA: Diagnosis not present

## 2019-03-26 DIAGNOSIS — C50411 Malignant neoplasm of upper-outer quadrant of right female breast: Secondary | ICD-10-CM | POA: Diagnosis not present

## 2019-03-26 DIAGNOSIS — Z171 Estrogen receptor negative status [ER-]: Secondary | ICD-10-CM | POA: Diagnosis not present

## 2019-03-26 DIAGNOSIS — Z923 Personal history of irradiation: Secondary | ICD-10-CM | POA: Diagnosis not present

## 2019-03-27 ENCOUNTER — Other Ambulatory Visit: Payer: Self-pay

## 2019-03-27 ENCOUNTER — Ambulatory Visit
Admission: RE | Admit: 2019-03-27 | Discharge: 2019-03-27 | Disposition: A | Discharge status: Home / Self Care | Payer: BLUE CROSS/BLUE SHIELD | Source: Ambulatory Visit | Attending: Radiation Oncology | Admitting: Radiation Oncology

## 2019-03-27 DIAGNOSIS — Z923 Personal history of irradiation: Secondary | ICD-10-CM | POA: Diagnosis not present

## 2019-03-27 DIAGNOSIS — C50411 Malignant neoplasm of upper-outer quadrant of right female breast: Secondary | ICD-10-CM | POA: Diagnosis not present

## 2019-03-27 DIAGNOSIS — Z9221 Personal history of antineoplastic chemotherapy: Secondary | ICD-10-CM | POA: Diagnosis not present

## 2019-03-27 DIAGNOSIS — Z79899 Other long term (current) drug therapy: Secondary | ICD-10-CM | POA: Diagnosis not present

## 2019-03-27 DIAGNOSIS — Z171 Estrogen receptor negative status [ER-]: Secondary | ICD-10-CM | POA: Diagnosis not present

## 2019-03-28 ENCOUNTER — Ambulatory Visit
Admission: RE | Admit: 2019-03-28 | Discharge: 2019-03-28 | Disposition: A | Discharge status: Home / Self Care | Payer: BLUE CROSS/BLUE SHIELD | Source: Ambulatory Visit | Attending: Radiation Oncology | Admitting: Radiation Oncology

## 2019-03-28 ENCOUNTER — Other Ambulatory Visit: Payer: Self-pay

## 2019-03-28 DIAGNOSIS — Z9221 Personal history of antineoplastic chemotherapy: Secondary | ICD-10-CM | POA: Diagnosis not present

## 2019-03-28 DIAGNOSIS — Z171 Estrogen receptor negative status [ER-]: Secondary | ICD-10-CM | POA: Diagnosis not present

## 2019-03-28 DIAGNOSIS — Z79899 Other long term (current) drug therapy: Secondary | ICD-10-CM | POA: Diagnosis not present

## 2019-03-28 DIAGNOSIS — Z923 Personal history of irradiation: Secondary | ICD-10-CM | POA: Diagnosis not present

## 2019-03-28 DIAGNOSIS — C50411 Malignant neoplasm of upper-outer quadrant of right female breast: Secondary | ICD-10-CM | POA: Diagnosis not present

## 2019-03-29 ENCOUNTER — Other Ambulatory Visit: Payer: Self-pay

## 2019-03-29 ENCOUNTER — Ambulatory Visit
Admission: RE | Admit: 2019-03-29 | Discharge: 2019-03-29 | Disposition: A | Discharge status: Home / Self Care | Payer: BLUE CROSS/BLUE SHIELD | Source: Ambulatory Visit | Attending: Radiation Oncology | Admitting: Radiation Oncology

## 2019-03-29 DIAGNOSIS — Z79899 Other long term (current) drug therapy: Secondary | ICD-10-CM | POA: Diagnosis not present

## 2019-03-29 DIAGNOSIS — C50411 Malignant neoplasm of upper-outer quadrant of right female breast: Secondary | ICD-10-CM | POA: Diagnosis not present

## 2019-03-29 DIAGNOSIS — Z171 Estrogen receptor negative status [ER-]: Secondary | ICD-10-CM | POA: Diagnosis not present

## 2019-03-29 DIAGNOSIS — Z9221 Personal history of antineoplastic chemotherapy: Secondary | ICD-10-CM | POA: Diagnosis not present

## 2019-03-29 DIAGNOSIS — Z923 Personal history of irradiation: Secondary | ICD-10-CM | POA: Diagnosis not present

## 2019-04-01 ENCOUNTER — Other Ambulatory Visit: Payer: Self-pay

## 2019-04-01 ENCOUNTER — Ambulatory Visit
Admission: RE | Admit: 2019-04-01 | Discharge: 2019-04-01 | Disposition: A | Discharge status: Home / Self Care | Payer: BLUE CROSS/BLUE SHIELD | Source: Ambulatory Visit | Attending: Radiation Oncology | Admitting: Radiation Oncology

## 2019-04-01 DIAGNOSIS — C50411 Malignant neoplasm of upper-outer quadrant of right female breast: Secondary | ICD-10-CM | POA: Diagnosis not present

## 2019-04-01 DIAGNOSIS — Z923 Personal history of irradiation: Secondary | ICD-10-CM | POA: Diagnosis not present

## 2019-04-01 DIAGNOSIS — Z171 Estrogen receptor negative status [ER-]: Secondary | ICD-10-CM | POA: Diagnosis not present

## 2019-04-01 DIAGNOSIS — Z9221 Personal history of antineoplastic chemotherapy: Secondary | ICD-10-CM | POA: Diagnosis not present

## 2019-04-01 DIAGNOSIS — Z79899 Other long term (current) drug therapy: Secondary | ICD-10-CM | POA: Diagnosis not present

## 2019-04-02 ENCOUNTER — Other Ambulatory Visit: Payer: Self-pay

## 2019-04-02 ENCOUNTER — Ambulatory Visit
Admission: RE | Admit: 2019-04-02 | Discharge: 2019-04-02 | Disposition: A | Discharge status: Home / Self Care | Payer: BLUE CROSS/BLUE SHIELD | Source: Ambulatory Visit | Attending: Radiation Oncology | Admitting: Radiation Oncology

## 2019-04-02 DIAGNOSIS — Z171 Estrogen receptor negative status [ER-]: Secondary | ICD-10-CM | POA: Diagnosis not present

## 2019-04-02 DIAGNOSIS — Z79899 Other long term (current) drug therapy: Secondary | ICD-10-CM | POA: Diagnosis not present

## 2019-04-02 DIAGNOSIS — C50411 Malignant neoplasm of upper-outer quadrant of right female breast: Secondary | ICD-10-CM | POA: Diagnosis not present

## 2019-04-02 DIAGNOSIS — Z9221 Personal history of antineoplastic chemotherapy: Secondary | ICD-10-CM | POA: Diagnosis not present

## 2019-04-02 DIAGNOSIS — Z923 Personal history of irradiation: Secondary | ICD-10-CM | POA: Diagnosis not present

## 2019-04-03 ENCOUNTER — Other Ambulatory Visit: Payer: Self-pay

## 2019-04-03 ENCOUNTER — Ambulatory Visit
Admission: RE | Admit: 2019-04-03 | Discharge: 2019-04-03 | Disposition: A | Discharge status: Home / Self Care | Payer: BLUE CROSS/BLUE SHIELD | Source: Ambulatory Visit | Attending: Radiation Oncology | Admitting: Radiation Oncology

## 2019-04-03 DIAGNOSIS — Z923 Personal history of irradiation: Secondary | ICD-10-CM | POA: Diagnosis not present

## 2019-04-03 DIAGNOSIS — Z171 Estrogen receptor negative status [ER-]: Secondary | ICD-10-CM | POA: Diagnosis not present

## 2019-04-03 DIAGNOSIS — Z79899 Other long term (current) drug therapy: Secondary | ICD-10-CM | POA: Diagnosis not present

## 2019-04-03 DIAGNOSIS — C50411 Malignant neoplasm of upper-outer quadrant of right female breast: Secondary | ICD-10-CM | POA: Diagnosis not present

## 2019-04-03 DIAGNOSIS — Z9221 Personal history of antineoplastic chemotherapy: Secondary | ICD-10-CM | POA: Diagnosis not present

## 2019-04-04 ENCOUNTER — Ambulatory Visit
Admission: RE | Admit: 2019-04-04 | Discharge: 2019-04-04 | Disposition: A | Discharge status: Home / Self Care | Payer: BLUE CROSS/BLUE SHIELD | Source: Ambulatory Visit | Attending: Radiation Oncology | Admitting: Radiation Oncology

## 2019-04-04 ENCOUNTER — Other Ambulatory Visit: Payer: Self-pay

## 2019-04-04 DIAGNOSIS — Z9221 Personal history of antineoplastic chemotherapy: Secondary | ICD-10-CM | POA: Diagnosis not present

## 2019-04-04 DIAGNOSIS — Z79899 Other long term (current) drug therapy: Secondary | ICD-10-CM | POA: Diagnosis not present

## 2019-04-04 DIAGNOSIS — Z923 Personal history of irradiation: Secondary | ICD-10-CM | POA: Diagnosis not present

## 2019-04-04 DIAGNOSIS — Z171 Estrogen receptor negative status [ER-]: Secondary | ICD-10-CM | POA: Diagnosis not present

## 2019-04-04 DIAGNOSIS — C50411 Malignant neoplasm of upper-outer quadrant of right female breast: Secondary | ICD-10-CM | POA: Diagnosis not present

## 2019-04-05 ENCOUNTER — Ambulatory Visit
Admission: RE | Admit: 2019-04-05 | Discharge: 2019-04-05 | Disposition: A | Discharge status: Home / Self Care | Payer: BLUE CROSS/BLUE SHIELD | Source: Ambulatory Visit | Attending: Radiation Oncology | Admitting: Radiation Oncology

## 2019-04-05 ENCOUNTER — Other Ambulatory Visit: Payer: Self-pay

## 2019-04-05 DIAGNOSIS — Z79899 Other long term (current) drug therapy: Secondary | ICD-10-CM | POA: Diagnosis not present

## 2019-04-05 DIAGNOSIS — Z171 Estrogen receptor negative status [ER-]: Secondary | ICD-10-CM | POA: Diagnosis not present

## 2019-04-05 DIAGNOSIS — C50411 Malignant neoplasm of upper-outer quadrant of right female breast: Secondary | ICD-10-CM | POA: Diagnosis not present

## 2019-04-05 DIAGNOSIS — Z9221 Personal history of antineoplastic chemotherapy: Secondary | ICD-10-CM | POA: Diagnosis not present

## 2019-04-05 DIAGNOSIS — Z923 Personal history of irradiation: Secondary | ICD-10-CM | POA: Diagnosis not present

## 2019-04-08 ENCOUNTER — Ambulatory Visit
Admission: RE | Admit: 2019-04-08 | Discharge: 2019-04-08 | Disposition: A | Discharge status: Home / Self Care | Payer: BLUE CROSS/BLUE SHIELD | Source: Ambulatory Visit | Attending: Radiation Oncology | Admitting: Radiation Oncology

## 2019-04-08 ENCOUNTER — Other Ambulatory Visit: Payer: Self-pay

## 2019-04-08 DIAGNOSIS — Z171 Estrogen receptor negative status [ER-]: Secondary | ICD-10-CM | POA: Diagnosis not present

## 2019-04-08 DIAGNOSIS — Z923 Personal history of irradiation: Secondary | ICD-10-CM | POA: Diagnosis not present

## 2019-04-08 DIAGNOSIS — C50411 Malignant neoplasm of upper-outer quadrant of right female breast: Secondary | ICD-10-CM | POA: Diagnosis not present

## 2019-04-08 DIAGNOSIS — Z79899 Other long term (current) drug therapy: Secondary | ICD-10-CM | POA: Diagnosis not present

## 2019-04-08 DIAGNOSIS — Z9221 Personal history of antineoplastic chemotherapy: Secondary | ICD-10-CM | POA: Diagnosis not present

## 2019-04-08 NOTE — Assessment & Plan Note (Signed)
/  27/2019:Right lumpectomy: IDC grade 2, 1.5 cm, with intermediate grade DCIS, margins negative, 0/3 lymph nodes negative, triple negative with Ki-67 of 50%, T1 CN 0 stage IB Patient works at [employer] and can work from home if necessary.  Treatment plan: 1.Adjuvant chemotherapy with dose dense Adriamycin and Cytoxan x4 followed by Taxol weekly x12 completed 01/30/2019 2.Adjuvant radiation therapy started 03/06/2019  UPBEAT clinical trial ([id number]): No adverse effects being on the clinical trial _________________________________________________________________________________________ Surveillance: Once radiation is completed her treatment. She can follow-up with survivorship in 3 months.

## 2019-04-09 ENCOUNTER — Other Ambulatory Visit: Payer: Self-pay

## 2019-04-09 ENCOUNTER — Ambulatory Visit
Admission: RE | Admit: 2019-04-09 | Discharge: 2019-04-09 | Disposition: A | Discharge status: Home / Self Care | Payer: BLUE CROSS/BLUE SHIELD | Source: Ambulatory Visit | Attending: Radiation Oncology | Admitting: Radiation Oncology

## 2019-04-09 DIAGNOSIS — Z9221 Personal history of antineoplastic chemotherapy: Secondary | ICD-10-CM | POA: Diagnosis not present

## 2019-04-09 DIAGNOSIS — Z79899 Other long term (current) drug therapy: Secondary | ICD-10-CM | POA: Diagnosis not present

## 2019-04-09 DIAGNOSIS — C50411 Malignant neoplasm of upper-outer quadrant of right female breast: Secondary | ICD-10-CM | POA: Diagnosis not present

## 2019-04-09 DIAGNOSIS — Z171 Estrogen receptor negative status [ER-]: Secondary | ICD-10-CM | POA: Diagnosis not present

## 2019-04-09 DIAGNOSIS — Z923 Personal history of irradiation: Secondary | ICD-10-CM | POA: Diagnosis not present

## 2019-04-10 ENCOUNTER — Ambulatory Visit
Admission: RE | Admit: 2019-04-10 | Discharge: 2019-04-10 | Disposition: A | Discharge status: Home / Self Care | Payer: BLUE CROSS/BLUE SHIELD | Source: Ambulatory Visit | Attending: Radiation Oncology | Admitting: Radiation Oncology

## 2019-04-10 ENCOUNTER — Other Ambulatory Visit: Payer: Self-pay

## 2019-04-10 DIAGNOSIS — Z171 Estrogen receptor negative status [ER-]: Secondary | ICD-10-CM | POA: Diagnosis not present

## 2019-04-10 DIAGNOSIS — Z9221 Personal history of antineoplastic chemotherapy: Secondary | ICD-10-CM | POA: Diagnosis not present

## 2019-04-10 DIAGNOSIS — Z923 Personal history of irradiation: Secondary | ICD-10-CM | POA: Diagnosis not present

## 2019-04-10 DIAGNOSIS — C50411 Malignant neoplasm of upper-outer quadrant of right female breast: Secondary | ICD-10-CM | POA: Diagnosis not present

## 2019-04-10 DIAGNOSIS — Z79899 Other long term (current) drug therapy: Secondary | ICD-10-CM | POA: Diagnosis not present

## 2019-04-10 NOTE — Progress Notes (Signed)
Patient Care Team: [name], MD as PCP - General (Family Medicine) Fanny Skates, MD as Consulting Physician (General Surgery) Nicholas Lose, MD as Consulting Physician (Hematology and Oncology) Kyung Rudd, MD as Consulting Physician (Radiation Oncology)  DIAGNOSIS:    ICD-10-CM   1. 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  The Hereditary Gene Panel offered by [facility] includes sequencing and/or deletion duplication testing of the following 47 genes: APC, ATM, AXIN2, BARD1, BMPR1A, BRCA1, BRCA2, BRIP1, CDH1, CDK4, CDKN2A (p14ARF), CDKN2A (p16INK4a), CHEK2, CTNNA1, DICER1, EPCAM (Deletion/duplication testing only), GREM1 (promoter region deletion/duplication testing only), KIT, MEN1, MLH1, MSH2, MSH3, MSH6, MUTYH, NBN, NF1, NHTL1, PALB2, PDGFRA, PMS2, POLD1, POLE, PTEN, RAD50, RAD51C, RAD51D, SDHB, SDHC, SDHD, SMAD4, SMARCA4. STK11, TP53, TSC1, TSC2, and VHL.  The following genes were evaluated for sequence changes only: SDHA and HOXB13 c.251G>A variant only. The report date is August 06, 2018.    08/07/2018 Surgery    Right lumpectomy: IDC grade 2, 1.5 cm, with intermediate grade DCIS, margins negative, 0/3 lymph nodes negative, triple negative with Ki-67 of 50%, T1 CN 0 stage IB    08/07/2018 Cancer Staging    Staging form: Breast, AJCC 8th Edition - Pathologic stage from 08/07/2018: Stage IB (pT1c,  pN0, cM0, G2, ER-, PR-, HER2-) - Signed by Gardenia Phlegm, NP on 08/22/2018    09/05/2018 - 01/30/2019 Chemotherapy    Adriamycin and Cytoxan x 4 to be followed by weekly Taxol    03/06/2019 - 04/10/2019 Radiation Therapy    Adj XRT     CHIEF COMPLIANT: Follow-up after radiation to discuss further treatment  INTERVAL HISTORY: Jennifer Carroll is a 53 y.o. with above-mentioned history of right breast cancer treated with lumpectomy, adjuvant chemotherapy, and adjuvant radiation.She presents to the clinic today after finishing radiation to discuss further treatment.  She tolerated radiation extremely well with exception of mild radiation dermatitis.  REVIEW OF SYSTEMS:   Constitutional: Denies fevers, chills or abnormal weight loss Eyes: Denies blurriness of vision Ears, nose, mouth, throat, and face: Denies mucositis or sore throat Respiratory: Denies cough, dyspnea or wheezes Cardiovascular: Denies palpitation, chest discomfort Gastrointestinal: Denies nausea, heartburn or change in bowel habits Skin: Denies abnormal skin rashes Lymphatics: Denies new lymphadenopathy or easy bruising Neurological: Denies numbness, tingling or new weaknesses Behavioral/Psych: Mood is stable, no new changes  Extremities: No lower extremity edema Breast: Radiation dermatitis All other systems were reviewed with the patient and are negative.  I have reviewed the past medical history, past surgical history, social history and family history with the patient and they are unchanged from previous note.  ALLERGIES:  has No Known Allergies.  MEDICATIONS:  Current Outpatient Medications  Medication Sig Dispense Refill  . gabapentin (NEURONTIN) 300 MG capsule Take 1 capsule (300 mg total) by mouth at bedtime. 21 capsule 0  . ibuprofen (ADVIL,MOTRIN) 200 MG tablet Take 200 mg by mouth every 6 (six) hours  as needed. Ibuprofen 200 mg 2 tablets As needed.    Marland Kitchen ketoconazole (NIZORAL) 2 % cream Apply 1  application topically daily. 15 g 0  . LOSARTAN POTASSIUM PO Take 50 mg by mouth daily.    . metroNIDAZOLE (METROGEL) 1 % gel Apply topically daily. 45 g 0  . Multiple Vitamin (MULTI-VITAMIN DAILY PO) Take by mouth.    . norethindrone (CAMILA) 0.35 MG tablet Take 1 tablet (0.35 mg total) by mouth daily. 3 Package 3  . nystatin (MYCOSTATIN/NYSTOP) powder Apply topically 4 (four) times daily. On affected area. (Breast rash and toes ) 30 g 1  . simvastatin (ZOCOR) 20 MG tablet Take 20 mg by mouth every evening.    . triamcinolone (KENALOG) 0.025 % ointment Apply 1 application topically 2 (two) times daily. 30 g 0   No current facility-administered medications for this visit.     PHYSICAL EXAMINATION: ECOG PERFORMANCE STATUS: 1 - Symptomatic but completely ambulatory  Vitals:   04/11/19 0814  BP: 118/88  Pulse: 88  Resp: 20  Temp: 97.7 F (36.5 C)  SpO2: 97%   Filed Weights   04/11/19 0814  Weight: 232 lb 11.2 oz (105.6 kg)    GENERAL: alert, no distress and comfortable SKIN: skin color, texture, turgor are normal, no rashes or significant lesions EYES: normal, Conjunctiva are pink and non-injected, sclera clear OROPHARYNX: no exudate, no erythema and lips, buccal mucosa, and tongue normal  NECK: supple, thyroid normal size, non-tender, without nodularity LYMPH: no palpable lymphadenopathy in the cervical, axillary or inguinal LUNGS: clear to auscultation and percussion with normal breathing effort HEART: regular rate & rhythm and no murmurs and no lower extremity edema ABDOMEN: abdomen soft, non-tender and normal bowel sounds MUSCULOSKELETAL: no cyanosis of digits and no clubbing  NEURO: alert & oriented x 3 with fluent speech, no focal motor/sensory deficits EXTREMITIES: No lower extremity edema  LABORATORY DATA:  I have reviewed the data as listed CMP Latest Ref Rng & Units 01/30/2019 01/23/2019 01/16/2019  Glucose 70 - 99 mg/dL 101(H) 90 102(H)  BUN 6 - 20 mg/dL _0 Creatinine 0.44 - 1.00 mg/dL 0.84 0.83 0.79  Sodium 135 - 145 mmol/L 141 141 141  Potassium 3.5 - 5.1 mmol/L 4.0 4.0 3.9  Chloride 98 - 111 mmol/L 107 108 110  CO2 22 - 32 mmol/L _1 Calcium 8.9 - 10.3 mg/dL 9.5 9.5 9.4  Total Protein 6.5 - 8.1 g/dL 6.9 6.7 6.7  Total Bilirubin 0.3 - 1.2 mg/dL 0.4 0.4 0.4  Alkaline Phos 38 - 126 U/L 89 79 79  AST 15 - 41 U/L _2 ALT 0 - 44 U/L _3 Lab Results  Component Value Date   WBC 3.1 (L) 01/30/2019   HGB 11.6 (L) 01/30/2019   HCT 34.7 (L) 01/30/2019   MCV 99.1 01/30/2019   PLT 213 01/30/2019   NEUTROABS 1.9 01/30/2019    ASSESSMENT & PLAN:  Malignant neoplasm of upper-outer quadrant of right breast in female, estrogen receptor negative ([facility]) /27/2019:Right lumpectomy: IDC grade 2, 1.5 cm, with intermediate grade DCIS, margins negative, 0/3 lymph nodes negative, triple negative with Ki-67 of 50%, T1 CN 0 stage IB Patient works at [employer] and can work from home if necessary.  Treatment plan: 1.Adjuvant chemotherapy with dose dense Adriamycin and Cytoxan x4 followed by Taxol weekly x12 completed 01/30/2019 2.Adjuvant radiation therapy started 03/06/2019  UPBEAT clinical trial ([id number]): No adverse effects  being on the clinical trial _________________________________________________________________________________________ Surveillance: Annual mammograms and breast exams No role of antiestrogen therapy because she is estrogen receptor negative.  She can follow-up with survivorship in 3 months. After that I will see her in 6 months and after that once a year for follow-ups.    No orders of the defined types were placed in this encounter.  The patient has a good understanding of the overall plan. she agrees with it. she will call with any problems that may develop before the next visit here.  Nicholas Lose, MD 04/11/2019  Julious Oka Dorshimer am acting as scribe for Dr. Nicholas Lose.  I have reviewed  the above documentation for accuracy and completeness, and I agree with the above.

## 2019-04-11 ENCOUNTER — Inpatient Hospital Stay: Payer: BLUE CROSS/BLUE SHIELD | Attending: Hematology and Oncology | Admitting: Hematology and Oncology

## 2019-04-11 ENCOUNTER — Ambulatory Visit
Admission: RE | Admit: 2019-04-11 | Discharge: 2019-04-11 | Disposition: A | Discharge status: Home / Self Care | Payer: BLUE CROSS/BLUE SHIELD | Source: Ambulatory Visit | Attending: Radiation Oncology | Admitting: Radiation Oncology

## 2019-04-11 ENCOUNTER — Other Ambulatory Visit: Payer: Self-pay

## 2019-04-11 DIAGNOSIS — Z923 Personal history of irradiation: Secondary | ICD-10-CM | POA: Diagnosis not present

## 2019-04-11 DIAGNOSIS — Z9221 Personal history of antineoplastic chemotherapy: Secondary | ICD-10-CM | POA: Insufficient documentation

## 2019-04-11 DIAGNOSIS — Z79899 Other long term (current) drug therapy: Secondary | ICD-10-CM | POA: Diagnosis not present

## 2019-04-11 DIAGNOSIS — C50411 Malignant neoplasm of upper-outer quadrant of right female breast: Secondary | ICD-10-CM | POA: Diagnosis not present

## 2019-04-11 DIAGNOSIS — Z171 Estrogen receptor negative status [ER-]: Secondary | ICD-10-CM | POA: Insufficient documentation

## 2019-04-11 NOTE — Progress Notes (Signed)
  Radiation Oncology         [phone] ________________________________  Name: Ariyona Eid MRN: [id number]  Date: 02/26/2019  DOB: 12-18-1965  Optical Surface Tracking Plan:  Since intensity modulated radiotherapy (IMRT) and 3D conformal radiation treatment methods are predicated on accurate and precise positioning for treatment, intrafraction motion monitoring is medically necessary to ensure accurate and safe treatment delivery.  The ability to quantify intrafraction motion without excessive ionizing radiation dose can only be performed with optical surface tracking. Accordingly, surface imaging offers the opportunity to obtain 3D measurements of patient position throughout IMRT and 3D treatments without excessive radiation exposure.  I am ordering optical surface tracking for this patient's upcoming course of radiotherapy. ________________________________  Kyung Rudd, MD 04/11/2019 12:09 PM    Reference:   Ursula Alert, J, et al. Surface imaging-based analysis of intrafraction motion for breast radiotherapy patients.Journal of [place], n. 6, nov. 2014. ISSN [id number].   Available at: <http://www.jacmp.org/index.php/jacmp/article/view/4957>.

## 2019-04-11 NOTE — Progress Notes (Signed)
  Radiation Oncology         [phone] ________________________________  Name: Jennifer Carroll MRN: [id number]  Date: 02/26/2019  DOB: Jan 03, 1966  DIAGNOSIS:     ICD-10-CM   1. Malignant neoplasm of upper-outer quadrant of right breast in female, estrogen receptor negative ([place]) C50.411    Z17.1      SIMULATION AND TREATMENT PLANNING NOTE  The patient presented for simulation prior to beginning her course of radiation treatment for her diagnosis of right-sided breast cancer. The patient was placed in a supine position on a breast board. A customized vac-lock bag was constructed and this complex treatment device will be used on a daily basis during her treatment. In this fashion, a CT scan was obtained through the chest area and an isocenter was placed near the chest wall within the breast.  The patient will be planned to receive a course of radiation initially to a dose of 50.4 Gy. This will consist of a whole breast radiotherapy technique. To accomplish this, 2 customized blocks have been designed which will correspond to medial and lateral whole breast tangent fields. This treatment will be accomplished at 1.8 Gy per fraction. A forward planning technique will also be evaluated to determine if this approach improves the plan. It is anticipated that the patient will then receive a 10 Gy boost to the seroma cavity which has been contoured. This will be accomplished at 2 Gy per fraction.   This initial treatment will consist of a 3-D conformal technique. The seroma has been contoured as the primary target structure. Additionally, dose volume histograms of both this target as well as the lungs and heart will also be evaluated. Such an approach is necessary to ensure that the target area is adequately covered while the nearby critical  normal structures are adequately spared.  Plan:  The final anticipated total dose therefore will correspond to 60.4 Gy.    _______________________________    Jodelle Gross, MD, PhD

## 2019-04-12 ENCOUNTER — Ambulatory Visit
Admission: RE | Admit: 2019-04-12 | Discharge: 2019-04-12 | Disposition: A | Discharge status: Home / Self Care | Payer: BLUE CROSS/BLUE SHIELD | Source: Ambulatory Visit | Attending: Radiation Oncology | Admitting: Radiation Oncology

## 2019-04-12 ENCOUNTER — Other Ambulatory Visit: Payer: Self-pay

## 2019-04-12 DIAGNOSIS — Z510 Encounter for antineoplastic radiation therapy: Secondary | ICD-10-CM | POA: Insufficient documentation

## 2019-04-12 DIAGNOSIS — Z171 Estrogen receptor negative status [ER-]: Secondary | ICD-10-CM | POA: Diagnosis not present

## 2019-04-12 DIAGNOSIS — E785 Hyperlipidemia, unspecified: Secondary | ICD-10-CM | POA: Diagnosis not present

## 2019-04-12 DIAGNOSIS — C50411 Malignant neoplasm of upper-outer quadrant of right female breast: Secondary | ICD-10-CM | POA: Diagnosis not present

## 2019-04-12 DIAGNOSIS — I10 Essential (primary) hypertension: Secondary | ICD-10-CM | POA: Diagnosis not present

## 2019-04-12 DIAGNOSIS — Z79899 Other long term (current) drug therapy: Secondary | ICD-10-CM | POA: Insufficient documentation

## 2019-04-12 DIAGNOSIS — Z808 Family history of malignant neoplasm of other organs or systems: Secondary | ICD-10-CM | POA: Diagnosis not present

## 2019-04-15 ENCOUNTER — Ambulatory Visit
Admission: RE | Admit: 2019-04-15 | Discharge: 2019-04-15 | Disposition: A | Discharge status: Home / Self Care | Payer: BLUE CROSS/BLUE SHIELD | Source: Ambulatory Visit | Attending: Radiation Oncology | Admitting: Radiation Oncology

## 2019-04-15 ENCOUNTER — Other Ambulatory Visit: Payer: Self-pay

## 2019-04-15 DIAGNOSIS — Z79899 Other long term (current) drug therapy: Secondary | ICD-10-CM | POA: Diagnosis not present

## 2019-04-15 DIAGNOSIS — E785 Hyperlipidemia, unspecified: Secondary | ICD-10-CM | POA: Diagnosis not present

## 2019-04-15 DIAGNOSIS — Z808 Family history of malignant neoplasm of other organs or systems: Secondary | ICD-10-CM | POA: Diagnosis not present

## 2019-04-15 DIAGNOSIS — Z510 Encounter for antineoplastic radiation therapy: Secondary | ICD-10-CM | POA: Diagnosis not present

## 2019-04-15 DIAGNOSIS — C50411 Malignant neoplasm of upper-outer quadrant of right female breast: Secondary | ICD-10-CM | POA: Diagnosis not present

## 2019-04-15 DIAGNOSIS — Z171 Estrogen receptor negative status [ER-]: Secondary | ICD-10-CM | POA: Diagnosis not present

## 2019-04-15 DIAGNOSIS — I10 Essential (primary) hypertension: Secondary | ICD-10-CM | POA: Diagnosis not present

## 2019-04-16 ENCOUNTER — Other Ambulatory Visit: Payer: Self-pay

## 2019-04-16 ENCOUNTER — Ambulatory Visit
Admission: RE | Admit: 2019-04-16 | Discharge: 2019-04-16 | Disposition: A | Discharge status: Home / Self Care | Payer: BLUE CROSS/BLUE SHIELD | Source: Ambulatory Visit | Attending: Radiation Oncology | Admitting: Radiation Oncology

## 2019-04-16 DIAGNOSIS — Z171 Estrogen receptor negative status [ER-]: Secondary | ICD-10-CM | POA: Diagnosis not present

## 2019-04-16 DIAGNOSIS — I10 Essential (primary) hypertension: Secondary | ICD-10-CM | POA: Diagnosis not present

## 2019-04-16 DIAGNOSIS — Z808 Family history of malignant neoplasm of other organs or systems: Secondary | ICD-10-CM | POA: Diagnosis not present

## 2019-04-16 DIAGNOSIS — Z510 Encounter for antineoplastic radiation therapy: Secondary | ICD-10-CM | POA: Diagnosis not present

## 2019-04-16 DIAGNOSIS — C50411 Malignant neoplasm of upper-outer quadrant of right female breast: Secondary | ICD-10-CM | POA: Diagnosis not present

## 2019-04-16 DIAGNOSIS — Z79899 Other long term (current) drug therapy: Secondary | ICD-10-CM | POA: Diagnosis not present

## 2019-04-16 DIAGNOSIS — E785 Hyperlipidemia, unspecified: Secondary | ICD-10-CM | POA: Diagnosis not present

## 2019-04-17 ENCOUNTER — Ambulatory Visit
Admission: RE | Admit: 2019-04-17 | Discharge: 2019-04-17 | Disposition: A | Discharge status: Home / Self Care | Payer: BLUE CROSS/BLUE SHIELD | Source: Ambulatory Visit | Attending: Radiation Oncology | Admitting: Radiation Oncology

## 2019-04-17 ENCOUNTER — Other Ambulatory Visit: Payer: Self-pay

## 2019-04-17 DIAGNOSIS — Z808 Family history of malignant neoplasm of other organs or systems: Secondary | ICD-10-CM | POA: Diagnosis not present

## 2019-04-17 DIAGNOSIS — I10 Essential (primary) hypertension: Secondary | ICD-10-CM | POA: Diagnosis not present

## 2019-04-17 DIAGNOSIS — C50411 Malignant neoplasm of upper-outer quadrant of right female breast: Secondary | ICD-10-CM | POA: Diagnosis not present

## 2019-04-17 DIAGNOSIS — Z171 Estrogen receptor negative status [ER-]: Secondary | ICD-10-CM | POA: Diagnosis not present

## 2019-04-17 DIAGNOSIS — E785 Hyperlipidemia, unspecified: Secondary | ICD-10-CM | POA: Diagnosis not present

## 2019-04-17 DIAGNOSIS — Z79899 Other long term (current) drug therapy: Secondary | ICD-10-CM | POA: Diagnosis not present

## 2019-04-17 DIAGNOSIS — Z510 Encounter for antineoplastic radiation therapy: Secondary | ICD-10-CM | POA: Diagnosis not present

## 2019-04-18 ENCOUNTER — Encounter: Payer: Self-pay | Admitting: Radiation Oncology

## 2019-04-18 ENCOUNTER — Other Ambulatory Visit: Payer: Self-pay

## 2019-04-18 ENCOUNTER — Ambulatory Visit
Admission: RE | Admit: 2019-04-18 | Discharge: 2019-04-18 | Disposition: A | Discharge status: Home / Self Care | Payer: BLUE CROSS/BLUE SHIELD | Source: Ambulatory Visit | Attending: Radiation Oncology | Admitting: Radiation Oncology

## 2019-04-18 DIAGNOSIS — Z808 Family history of malignant neoplasm of other organs or systems: Secondary | ICD-10-CM | POA: Diagnosis not present

## 2019-04-18 DIAGNOSIS — Z510 Encounter for antineoplastic radiation therapy: Secondary | ICD-10-CM | POA: Diagnosis not present

## 2019-04-18 DIAGNOSIS — Z79899 Other long term (current) drug therapy: Secondary | ICD-10-CM | POA: Diagnosis not present

## 2019-04-18 DIAGNOSIS — E785 Hyperlipidemia, unspecified: Secondary | ICD-10-CM | POA: Diagnosis not present

## 2019-04-18 DIAGNOSIS — C50411 Malignant neoplasm of upper-outer quadrant of right female breast: Secondary | ICD-10-CM | POA: Diagnosis not present

## 2019-04-18 DIAGNOSIS — I10 Essential (primary) hypertension: Secondary | ICD-10-CM | POA: Diagnosis not present

## 2019-04-18 DIAGNOSIS — Z171 Estrogen receptor negative status [ER-]: Secondary | ICD-10-CM | POA: Diagnosis not present

## 2019-05-21 NOTE — Progress Notes (Signed)
  Radiation Oncology         [phone] ________________________________  Name: Jennifer Carroll MRN: [id number]  Date: 04/18/2019  DOB: 08-02-1966  End of Treatment Note  Diagnosis:   53 y.o. 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 This was delivered using a 3-D conformal technique. The patient then received a boost to the seroma. This delivered an additional 10 Gy in 5 fractions using 15X, 10X photons with a Complex Isodose technique. The total dose was 60.4 Gy.  Narrative: The patient tolerated radiation treatment relatively well.   The patient had some expected skin irritation as she progressed during treatment. Moist desquamation was present in the axilla and inframammary region at the end of treatment. She is applying Neosporin to these areas along with along with a telfa non adherent dressing. She also noted soreness in her breast and increased fatigue.  Plan: The patient has completed radiation treatment and will use [place]boro soaks on affected areas. The patient will return to radiation oncology clinic for routine followup in one month. I advised the patient to call or return sooner if they have any questions or concerns related to their recovery or treatment. ________________________________  Jodelle Gross, MD, PhD  This document serves as a record of services personally performed by Kyung Rudd, MD. It was created on his behalf by Rae Lips, a trained medical scribe. The creation of this record is based on the scribe's personal observations and the provider's statements to them. This document has been checked and approved by the attending provider.

## 2019-05-22 ENCOUNTER — Telehealth: Payer: Self-pay | Admitting: Radiation Oncology

## 2019-05-22 NOTE — Telephone Encounter (Signed)
  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 This was delivered using a 3-D conformal technique. The patient then received a boost to the seroma. This delivered an additional 10 Gy in 5 fractions using 15X, 10X photons with a Complex Isodose technique. The total dose was 60.4 Gy.  Narrative:  The patient was contacted today for routine follow-up. During treatment she did very well with radiotherapy and did not have significant desquamation. She reports she is doing great and has no skin concerns.  Impression/Plan: 1. 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 The patient has been doing well since completion of radiotherapy. We discussed that we would be happy to continue to follow her as needed, but she will also continue to follow up with Dr. [name] in medical oncology. She was counseled on skin care as well as measures to avoid sun exposure to this area.  2. Survivorship. We discussed the importance of survivorship evaluation and was given the phone number for Ottis Stain [phone]) to be added to the list to receive the monthly resource calendar for the cancer center.     Carola Rhine, PAC

## 2019-06-03 ENCOUNTER — Encounter: Payer: Self-pay | Admitting: Hematology and Oncology

## 2019-06-10 ENCOUNTER — Encounter: Payer: Self-pay | Admitting: Adult Health

## 2019-06-11 DIAGNOSIS — Z0000 Encounter for general adult medical examination without abnormal findings: Secondary | ICD-10-CM | POA: Diagnosis not present

## 2019-06-11 DIAGNOSIS — E7800 Pure hypercholesterolemia, unspecified: Secondary | ICD-10-CM | POA: Diagnosis not present

## 2019-06-11 DIAGNOSIS — I10 Essential (primary) hypertension: Secondary | ICD-10-CM | POA: Diagnosis not present

## 2019-06-12 DIAGNOSIS — I10 Essential (primary) hypertension: Secondary | ICD-10-CM | POA: Diagnosis not present

## 2019-06-12 DIAGNOSIS — E7800 Pure hypercholesterolemia, unspecified: Secondary | ICD-10-CM | POA: Diagnosis not present

## 2019-06-28 ENCOUNTER — Telehealth: Payer: Self-pay | Admitting: Adult Health

## 2019-07-01 ENCOUNTER — Encounter: Payer: Self-pay | Admitting: Adult Health

## 2019-07-01 ENCOUNTER — Inpatient Hospital Stay: Payer: BC Managed Care – PPO | Attending: Hematology and Oncology | Admitting: Adult Health

## 2019-07-01 DIAGNOSIS — Z1589 Genetic susceptibility to other disease: Secondary | ICD-10-CM

## 2019-07-01 DIAGNOSIS — Z171 Estrogen receptor negative status [ER-]: Secondary | ICD-10-CM | POA: Diagnosis not present

## 2019-07-01 DIAGNOSIS — Z1509 Genetic susceptibility to other malignant neoplasm: Secondary | ICD-10-CM

## 2019-07-01 DIAGNOSIS — C50411 Malignant neoplasm of upper-outer quadrant of right female breast: Secondary | ICD-10-CM | POA: Diagnosis not present

## 2019-07-01 DIAGNOSIS — Z923 Personal history of irradiation: Secondary | ICD-10-CM

## 2019-07-01 DIAGNOSIS — Z1501 Genetic susceptibility to malignant neoplasm of breast: Secondary | ICD-10-CM

## 2019-07-01 DIAGNOSIS — Z9221 Personal history of antineoplastic chemotherapy: Secondary | ICD-10-CM

## 2019-07-01 DIAGNOSIS — Z1502 Genetic susceptibility to malignant neoplasm of ovary: Secondary | ICD-10-CM | POA: Diagnosis not present

## 2019-07-01 DIAGNOSIS — Z1231 Encounter for screening mammogram for malignant neoplasm of breast: Secondary | ICD-10-CM

## 2019-07-01 NOTE — Progress Notes (Signed)
SURVIVORSHIP VIRTUAL VISIT:  I connected with Jennifer Carroll on 07/01/19 at  2:00 PM EDT by video and verified that I am speaking with the correct person using two identifiers.   I discussed the limitations, risks, security and privacy concerns of performing an evaluation and management service virtually and the availability of in person appointments. I also discussed with the patient that there may be a patient responsible charge related to this service. The patient expressed understanding and agreed to proceed.   BRIEF ONCOLOGIC HISTORY:  Oncology History  Malignant neoplasm of upper-outer quadrant of right breast in female, estrogen receptor negative ([place])  07/13/2018 Initial Diagnosis   Screening detected right breast asymmetry UOQ 10 o'clock position 1.4 cm, axilla negative, biopsy revealed grade 2 IDC triple negative with a Ki-67 of 50% T1c N0 stage Ib AJCC 8   08/06/2018 Genetic Testing   NBN c.2117C>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  The Hereditary Gene Panel offered by [facility] includes sequencing and/or deletion duplication testing of the following 47 genes: APC, ATM, AXIN2, BARD1, BMPR1A, BRCA1, BRCA2, BRIP1, CDH1, CDK4, CDKN2A (p14ARF), CDKN2A (p16INK4a), CHEK2, CTNNA1, DICER1, EPCAM (Deletion/duplication testing only), GREM1 (promoter region deletion/duplication testing only), KIT, MEN1, MLH1, MSH2, MSH3, MSH6, MUTYH, NBN, NF1, NHTL1, PALB2, PDGFRA, PMS2, POLD1, POLE, PTEN, RAD50, RAD51C, RAD51D, SDHB, SDHC, SDHD, SMAD4, SMARCA4. STK11, TP53, TSC1, TSC2, and VHL.  The following genes were evaluated for sequence changes only: SDHA and HOXB13 c.251G>A variant only. The report date is August 06, 2018.   08/07/2018 Surgery   Right lumpectomy: IDC grade 2, 1.5 cm, with intermediate grade DCIS, margins negative, 0/3 lymph nodes negative, triple negative with Ki-67 of 50%, T1 CN 0 stage IB   08/07/2018 Cancer  Staging   Staging form: Breast, AJCC 8th Edition - Pathologic stage from 08/07/2018: Stage IB (pT1c, pN0, cM0, G2, ER-, PR-, HER2-) - Signed by Gardenia Phlegm, NP on 08/22/2018   09/05/2018 - 01/30/2019 Chemotherapy   Adriamycin and Cytoxan x 4 to be followed by weekly Taxol   03/06/2019 - 04/10/2019 Radiation Therapy   Adj XRT     INTERVAL HISTORY:  Jennifer Carroll to review her survivorship care plan detailing her treatment course for breast cancer, as well as monitoring long-term side effects of that treatment, education regarding health maintenance, screening, and overall wellness and health promotion.     Overall, Jennifer Carroll reports feeling quite well.  She notes her skin is still touchy from time to time, but overall largely improved.  She is exrecising regularly.  Jennifer Carroll has been working from home since March of this year and plans to do this until October of this year.    REVIEW OF SYSTEMS:  Review of Systems  Constitutional: Negative for appetite change, chills, fatigue, fever and unexpected weight change.  HENT:   Negative for hearing loss, lump/mass and trouble swallowing.   Eyes: Negative for eye problems and icterus.  Respiratory: Negative for chest tightness, cough and shortness of breath.   Cardiovascular: Negative for chest pain, leg swelling and palpitations.  Gastrointestinal: Negative for abdominal distention, abdominal pain, constipation, diarrhea, nausea and vomiting.  Endocrine: Negative for hot flashes.  Musculoskeletal: Negative for arthralgias.  Skin: Negative for itching and rash.  Neurological: Negative for dizziness, extremity weakness, headaches and numbness.  Hematological: Negative for adenopathy. Does not bruise/bleed easily.  Psychiatric/Behavioral: Negative for depression. The patient is not nervous/anxious.   Breast: Denies any new nodularity, masses, tenderness, nipple  changes, or nipple discharge.      ONCOLOGY TREATMENT TEAM:  1. Surgeon:   Dr. Dalbert Batman at [facility] 2. Medical Oncologist: Dr. [name]  3. Radiation Oncologist: Dr. [name]    PAST MEDICAL/SURGICAL HISTORY:  Past Medical History:  Diagnosis Date  . AMA (advanced maternal age) multigravida 35+ 03/28/05   with no amniocentesis  . 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 Cancer [facility])    Right breast   . Family history of pancreatic cancer   . H/O infertility   . H/O menorrhagia   . History of chicken pox   . History of measles, mumps, or rubella   . Hyperlipidemia   . Hypertension   . Low iron   . Menses, irregular   . 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   Jennifer Carroll    Past Surgical History:  Procedure Laterality Date  . BREAST LUMPECTOMY WITH RADIOACTIVE SEED AND SENTINEL LYMPH NODE BIOPSY Right 08/07/2018   Procedure: INJECT BLUE DYE RIGHT BREAST,RIGHT BREAST LUMPECTOMY WITH RADIOACTIVE SEED AND RIGHT AXILLARY DEEP SENTINEL LYMPH NODE BIOPSY;  Surgeon: Fanny Skates, MD;  Location: [facility];  Service: General;  Laterality: Right;  . EYE SURGERY     left eye at age 33  . head surgery     to cover hole in head at age of  87 yrs young  . PORTACATH PLACEMENT Left 08/07/2018   Procedure: INSERTION PORT-A-CATH;  Surgeon: Fanny Skates, MD;  Location: [facility];  Service: General;  Laterality: Left;  . WISDOM TOOTH EXTRACTION  2001     ALLERGIES:  No Known Allergies   CURRENT MEDICATIONS:  Outpatient Encounter Medications as of 07/01/2019  Medication Sig  . BIOTIN PO Take by mouth daily.  . Cyanocobalamin (B-12 PO) Take by mouth daily.  Marland Kitchen ibuprofen (ADVIL,MOTRIN) 200 MG tablet Take 200 mg by mouth every 6 (six) hours as needed. Ibuprofen 200 mg 2 tablets As needed.  Marland Kitchen LOSARTAN POTASSIUM PO Take 50 mg by mouth daily.  . Multiple Vitamin (MULTI-VITAMIN DAILY  PO) Take by mouth.  . nystatin (MYCOSTATIN/NYSTOP) powder Apply topically 4 (four) times daily. On affected area. (Breast rash and toes )  . Pyridoxine HCl (B-6 PO) Take by mouth daily.  . simvastatin (ZOCOR) 20 MG tablet Take 20 mg by mouth every evening.  . norethindrone (CAMILA) 0.35 MG tablet Take 1 tablet (0.35 mg total) by mouth daily.  . [DISCONTINUED] gabapentin (NEURONTIN) 300 MG capsule Take 1 capsule (300 mg total) by mouth at bedtime. (Patient not taking: Reported on 07/01/2019)  . [DISCONTINUED] ketoconazole (NIZORAL) 2 % cream Apply 1 application topically daily. (Patient not taking: Reported on 07/01/2019)  . [DISCONTINUED] metroNIDAZOLE (METROGEL) 1 % gel Apply topically daily. (Patient not taking: Reported on 07/01/2019)  . [DISCONTINUED] prochlorperazine (COMPAZINE) 10 MG tablet Take 1 tablet (10 mg total) by mouth every 6 (six) hours as needed (Nausea or vomiting).  . [DISCONTINUED] triamcinolone (KENALOG) 0.025 % ointment Apply 1 application topically 2 (two) times daily. (Patient not taking: Reported on 07/01/2019)   No facility-administered encounter medications on file as of 07/01/2019.      ONCOLOGIC FAMILY HISTORY:  Family History  Problem Relation Age of Onset  . Hypertension Mother   . Kidney disease Mother   . Heart disease Father   . Hypertension Brother   .  Kidney failure Maternal Grandfather   . Pancreatic cancer Cousin        dx >50     GENETIC COUNSELING/TESTING: See above  SOCIAL HISTORY:  Social History   Socioeconomic History  . Marital status: Married    Spouse name: Not on file  . Number of children: Not on file  . Years of education: Not on file  . Highest education level: Not on file  Occupational History  . Not on file  Social Needs  . Financial resource strain: Not on file  . Food insecurity    Worry: Not on file    Inability: Not on file  . Transportation needs    Medical: No    Non-medical: No  Tobacco Use  . Smoking status:  Never Smoker  . Smokeless tobacco: Never Used  Substance and Sexual Activity  . Alcohol use: Not Currently  . Drug use: Never  . Sexual activity: Yes  Lifestyle  . Physical activity    Days per week: Not on file    Minutes per session: Not on file  . Stress: Not on file  Relationships  . Social Herbalist on phone: Not on file    Gets together: Not on file    Attends religious service: Not on file    Active member of club or organization: Not on file    Attends meetings of clubs or organizations: Not on file    Relationship status: Not on file  . Intimate partner violence    Fear of current or ex partner: Not on file    Emotionally abused: Not on file    Physically abused: Not on file    Forced sexual activity: Not on file  Other Topics Concern  . Not on file  Social History Narrative  . Not on file     OBSERVATIONS/OBJECTIVE:  Patient appears well.  She is in no apparent distress.  Breathing non labored.  Mood and behavior are normal.    LABORATORY DATA:  None for this visit.  DIAGNOSTIC IMAGING:  None for this visit.      ASSESSMENT AND PLAN:  Ms.. Carroll is a pleasant 53 y.o. female with Stage IB right breast invasive ductal carcinoma, ER-/PR-/HER2-, diagnosed in 07/2018, treated with lumpectomy, adjuvant chemotherapy, and, adjuvant radiation therapy.  She presents to the Survivorship Clinic for our initial meeting and routine follow-up post-completion of treatment for breast cancer.    1. Stage IB right breast cancer:  Jennifer Carroll is continuing to recover from definitive treatment for breast cancer. She will follow-up with her medical oncologist, Dr. [name] in 11/2019 with history and physical exam per surveillance protocol.  Her mammogram is due 10/2019; orders placed today.  We reviewed that per NCCN guidelines her mammogram is due 6 months after completing radiation. Today, a comprehensive survivorship care plan and treatment summary was reviewed with  the patient today detailing her breast cancer diagnosis, treatment course, potential late/long-term effects of treatment, appropriate follow-up care with recommendations for the future, and patient education resources.  A copy of this summary, along with a letter will be sent to the patient's primary care provider via mail/fax/In Basket message after today's visit.    2. NBN mutation: Per her discussion with genetic counselors.  She was recommended to undergo annual MRI due to increased breast cancer risk.  I ordered this to be done 6 months after her mammogram, or in 04/2020.  3. Bone health:  Given Jennifer Carroll's history  of breast cancer, she is at slight risk for bone demineralization.    She was given education on specific activities to promote bone health.  4. Cancer screening:  Due to Jennifer Carroll's history and her age, she should receive screening for skin cancers, colon cancer, and gynecologic cancers.  The information and recommendations are listed on the patient's comprehensive care plan/treatment summary and were reviewed in detail with the patient.    5. Health maintenance and wellness promotion: Jennifer Carroll was encouraged to consume 5-7 servings of fruits and vegetables per day. We reviewed the "Nutrition Rainbow" handout, as well as the handout "Take Control of Your Health and Reduce Your Cancer Risk" from the [employer].  She was also encouraged to engage in moderate to vigorous exercise for 30 minutes per day most days of the week. We discussed the [employer] fitness program, which is designed for cancer survivors to help them become more physically fit after cancer treatments.  She was instructed to limit her alcohol consumption and continue to abstain from tobacco use.     6. Support services/counseling: It is not uncommon for this period of the patient's cancer care trajectory to be one of many emotions and stressors.  We discussed how this can be increasingly difficult  during the times of quarantine and social distancing due to the COVID-19 pandemic.   She was given information regarding our available services and encouraged to contact me with any questions or for help enrolling in any of our support group/programs.    Follow up instructions:    -Return to cancer center in 11/2019 for f/u with Dr. [name]  -Mammogram due in 10/2019 -Breast MRI in 04/2020 -Follow up with surgery 07/2019 -She is welcome to return back to the Survivorship Clinic at any time; no additional follow-up needed at this time.  -Consider referral back to survivorship as a long-term survivor for continued surveillance  The patient was provided an opportunity to ask questions and all were answered. The patient agreed with the plan and demonstrated an understanding of the instructions.   The patient was advised to call back or seek an in-person evaluation if the symptoms worsen or if the condition fails to improve as anticipated.   I provided 25 minutes of face-to-face video visit time during this encounter, and > 50% was spent counseling as documented under my assessment & plan.  Scot Dock, NP

## 2019-07-16 DIAGNOSIS — Z124 Encounter for screening for malignant neoplasm of cervix: Secondary | ICD-10-CM | POA: Diagnosis not present

## 2019-07-16 DIAGNOSIS — Z3040 Encounter for surveillance of contraceptives, unspecified: Secondary | ICD-10-CM | POA: Diagnosis not present

## 2019-07-16 DIAGNOSIS — N912 Amenorrhea, unspecified: Secondary | ICD-10-CM | POA: Diagnosis not present

## 2019-07-16 DIAGNOSIS — Z01419 Encounter for gynecological examination (general) (routine) without abnormal findings: Secondary | ICD-10-CM | POA: Diagnosis not present

## 2019-07-31 ENCOUNTER — Encounter: Payer: Self-pay | Admitting: Adult Health

## 2019-07-31 NOTE — Telephone Encounter (Signed)
Reached patient confirming order placed for diagnostic bilateral mammogram at [facility] on [address]  Expected to be completed by October 14, 2019.  [facility] Ph: [phone] to schedule.  Currently no further questions or needs.

## 2019-08-01 DIAGNOSIS — Z1211 Encounter for screening for malignant neoplasm of colon: Secondary | ICD-10-CM | POA: Diagnosis not present

## 2019-08-08 DIAGNOSIS — C50411 Malignant neoplasm of upper-outer quadrant of right female breast: Secondary | ICD-10-CM | POA: Diagnosis not present

## 2019-08-08 DIAGNOSIS — E669 Obesity, unspecified: Secondary | ICD-10-CM | POA: Diagnosis not present

## 2019-08-08 DIAGNOSIS — I10 Essential (primary) hypertension: Secondary | ICD-10-CM | POA: Diagnosis not present

## 2019-08-08 DIAGNOSIS — E785 Hyperlipidemia, unspecified: Secondary | ICD-10-CM | POA: Diagnosis not present

## 2019-10-21 ENCOUNTER — Other Ambulatory Visit: Payer: Self-pay

## 2019-10-21 ENCOUNTER — Ambulatory Visit
Admission: RE | Admit: 2019-10-21 | Discharge: 2019-10-21 | Disposition: A | Discharge status: Home / Self Care | Payer: BC Managed Care – PPO | Source: Ambulatory Visit | Attending: Adult Health | Admitting: Adult Health

## 2019-10-21 DIAGNOSIS — C50411 Malignant neoplasm of upper-outer quadrant of right female breast: Secondary | ICD-10-CM

## 2019-10-21 DIAGNOSIS — R928 Other abnormal and inconclusive findings on diagnostic imaging of breast: Secondary | ICD-10-CM | POA: Diagnosis not present

## 2019-10-21 HISTORY — DX: Personal history of irradiation: Z92.3

## 2019-10-21 HISTORY — DX: Malignant neoplasm of unspecified site of unspecified female breast: C50.919

## 2019-10-21 HISTORY — DX: Personal history of antineoplastic chemotherapy: Z92.21

## 2019-10-22 ENCOUNTER — Encounter: Payer: Self-pay | Admitting: Adult Health

## 2019-10-22 ENCOUNTER — Telehealth: Payer: Self-pay | Admitting: Hematology and Oncology

## 2019-10-22 NOTE — Telephone Encounter (Signed)
Scheduled appt per 11/10 sch message - pt is aware of appt date and time   

## 2019-10-30 IMAGING — MG DIGITAL DIAGNOSTIC UNILATERAL RIGHT MAMMOGRAM WITH TOMO AND CAD
4 series · 4 of 12 positions shown · non-contrast
Comparison: Mammography 07/02/2018, 06/12/2017 and earlier from
[REDACTED] OBGYN.

CLINICAL DATA: Recall from screening mammography with
tomosynthesis, possible developing asymmetry in the UPPER OUTER
RIGHT breast at far POSTERIOR depth.

EXAM:
DIGITAL DIAGNOSTIC RIGHT MAMMOGRAM WITH TOMO
ULTRASOUND RIGHT BREAST

[R CC synth-2D]
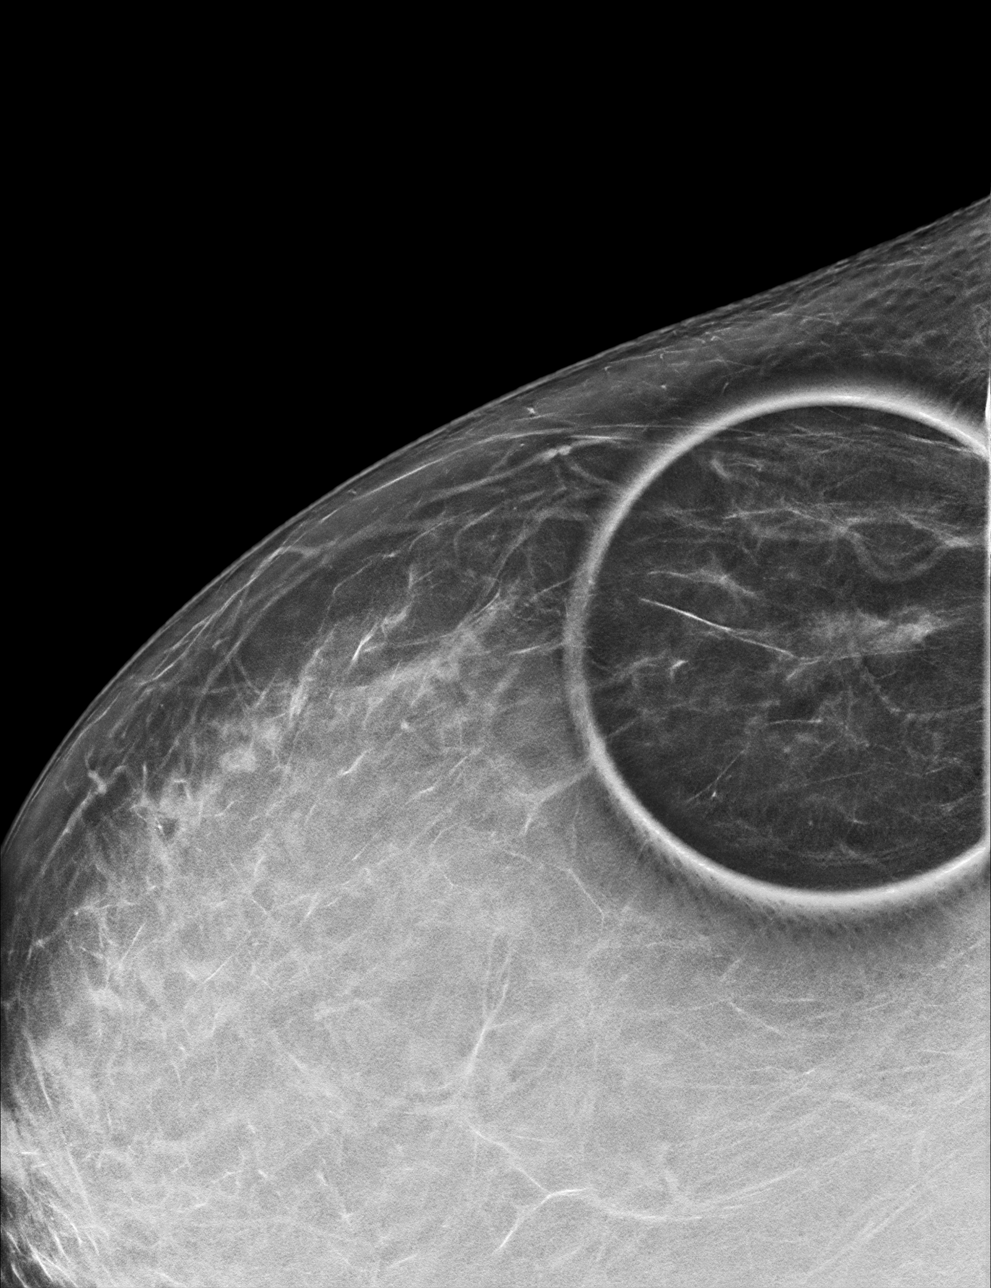

[R MLO synth-2D]
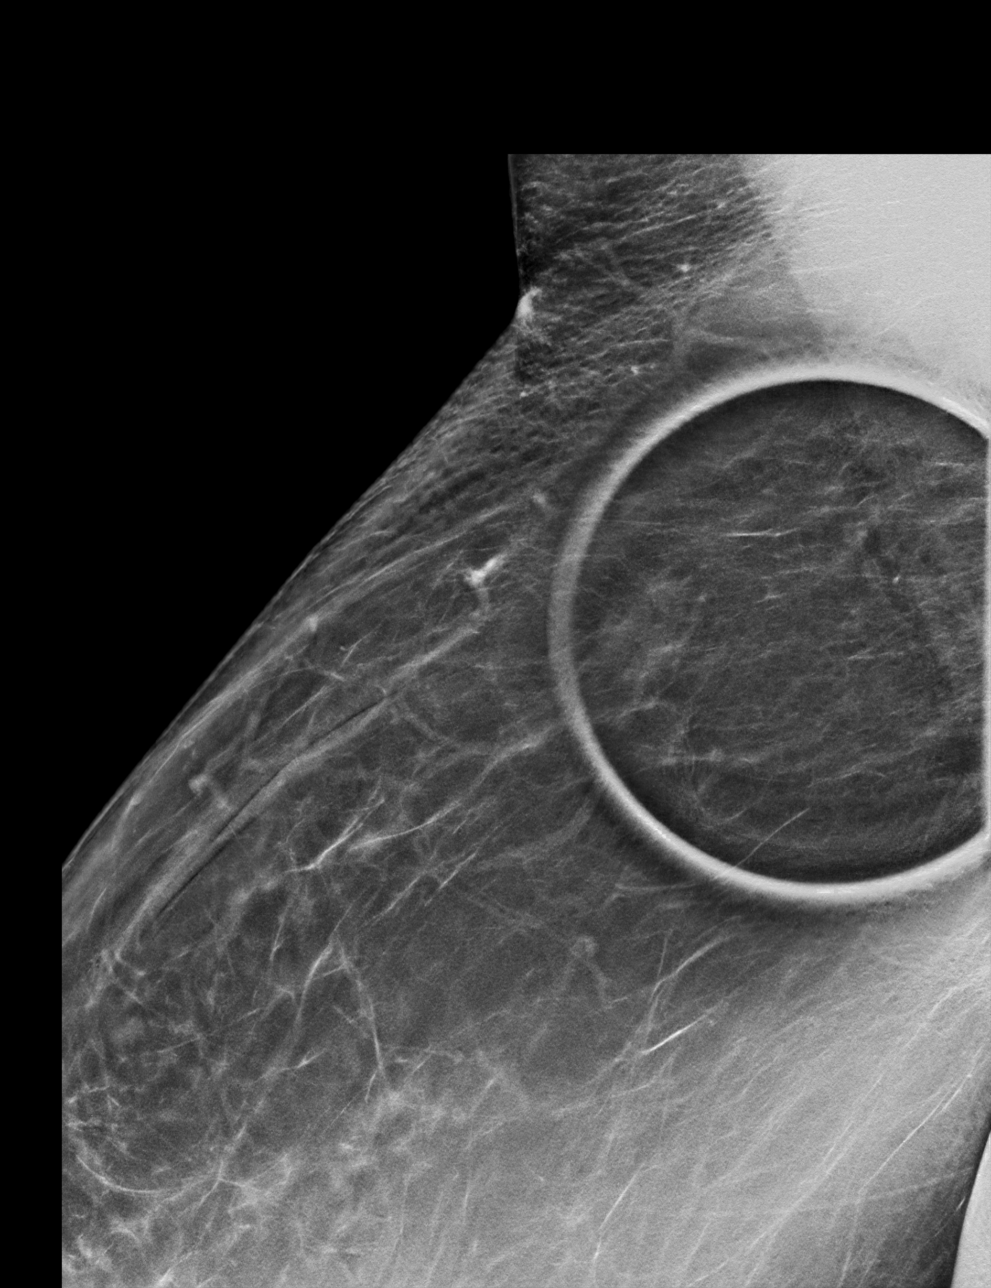

[R CC tomo · tomo slice 38/75.0]
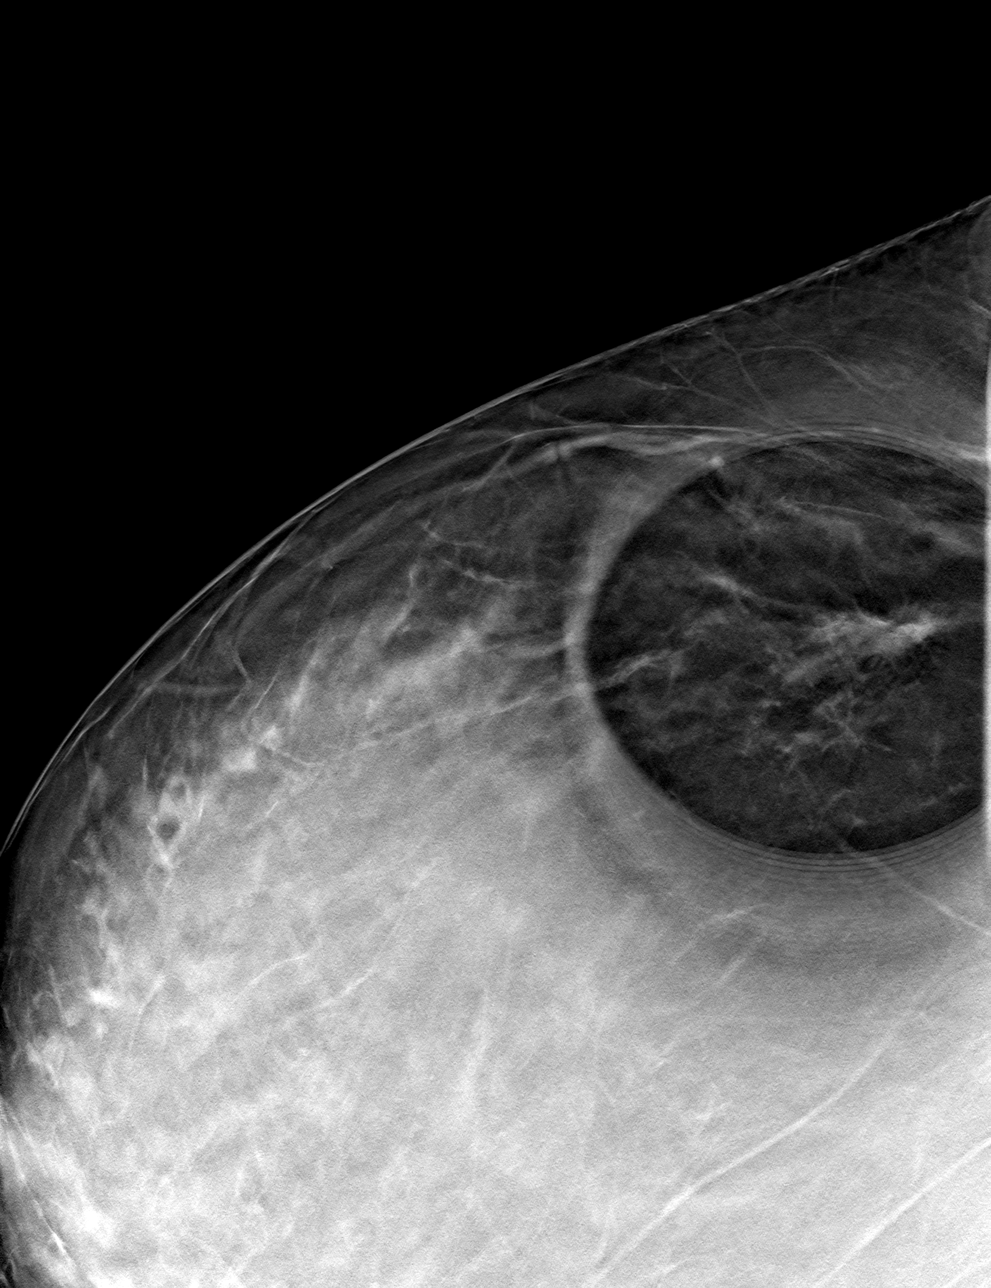

[R MLO tomo · tomo slice 48/95.0]
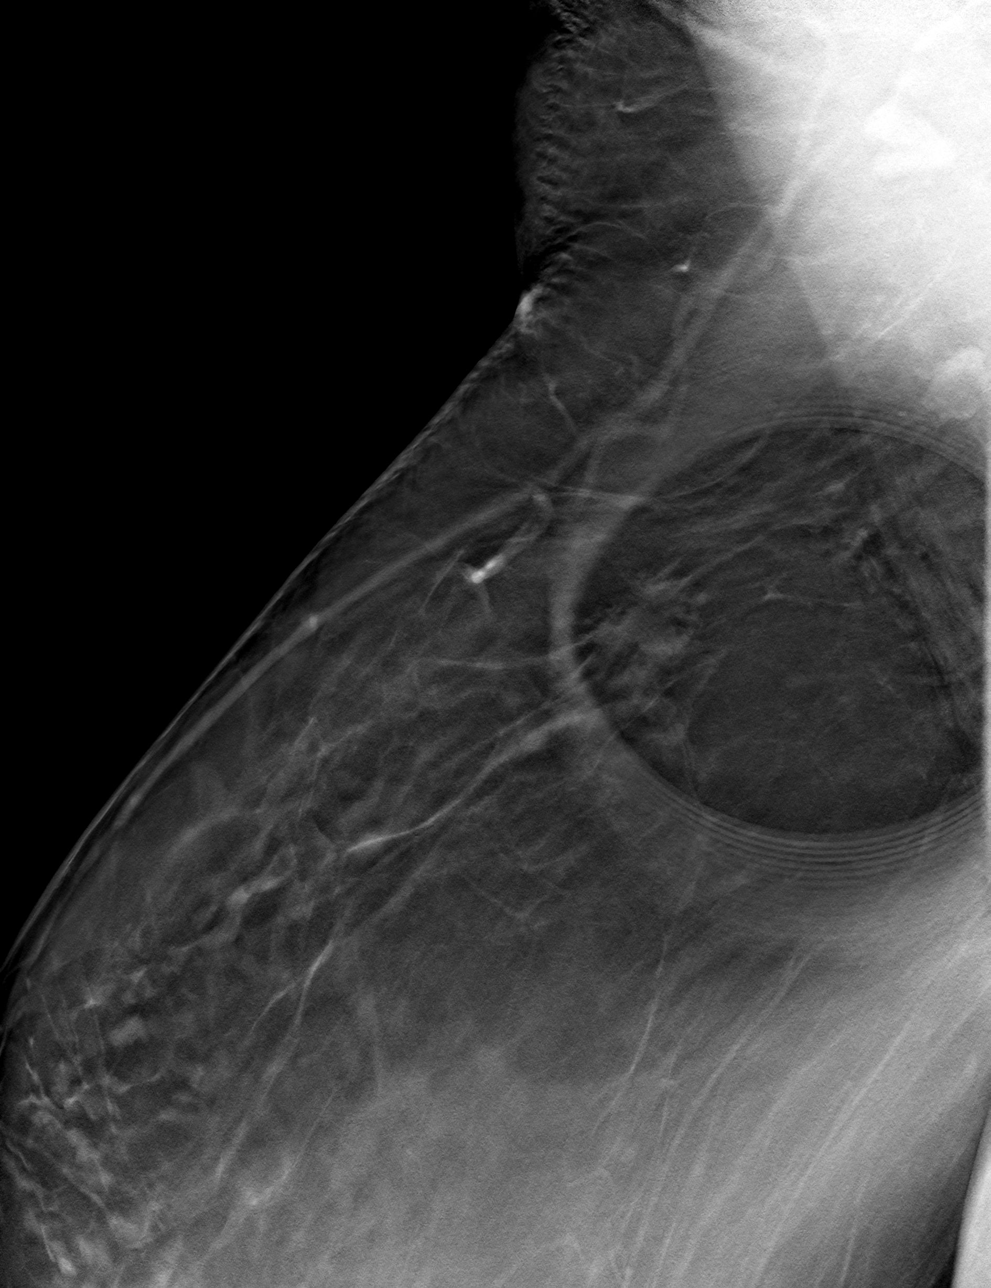

[4 of 12 positions shown; findings below may reference images not displayed]

ACR Breast Density Category b: There are scattered areas of
fibroglandular density.
FINDINGS: The focal asymmetry in the UPPER OUTER QUADRANT of the RIGHT breast
at POSTERIOR depth persists on the spot compression views, and there
is evidence of associated architectural distortion, especially on
the CC tomosynthesis images. There are no associated suspicious
calcifications.

On physical exam, the tissues of the UPPER OUTER QUADRANT of the
RIGHT breast have a "lumpy bumpy" texture, though I do not palpate a
discrete mass.

Targeted RIGHT breast ultrasound is performed, showing a hypoechoic
antiparallel mass with indistinct and irregular margins at the 10
o'clock position approximately 20 cm from the nipple measuring
approximately 1.0 x 1.0 x 1.4 cm, demonstrating slight posterior
acoustic shadowing and no internal power Doppler flow, corresponding
to the screening mammographic finding.

Sonographic evaluation of the RIGHT axilla demonstrates no
pathologic lymphadenopathy.
IMPRESSION: 1. Highly suspicious approximate 1.4 cm mass involving the UPPER
OUTER QUADRANT of the RIGHT breast at POSTERIOR depth at the 10
o'clock position approximately 20 cm from the nipple.
2. No pathologic RIGHT axillary lymphadenopathy.

RECOMMENDATION:
Ultrasound-guided core needle biopsy of the RIGHT breast mass.

The ultrasound core needle biopsy procedure was discussed with
patient and her questions were answered. She has agreed to proceed
and the biopsy has been scheduled for this [REDACTED], [DATE] at [DATE]
p.m..

I have discussed the findings and recommendations with the patient.
Results were also provided in writing at the conclusion of the
visit.

BI-RADS CATEGORY  5: Highly suggestive of malignancy.

## 2019-11-01 IMAGING — MG MM CLIP PLACEMENT
2 series · 2 of 2 positions shown · non-contrast
Comparison: Previous exam(s).

CLINICAL DATA: Status post ultrasound-guided core biopsy of the
right breast.

EXAM:
DIAGNOSTIC RIGHT MAMMOGRAM POST ULTRASOUND BIOPSY

[R ML]
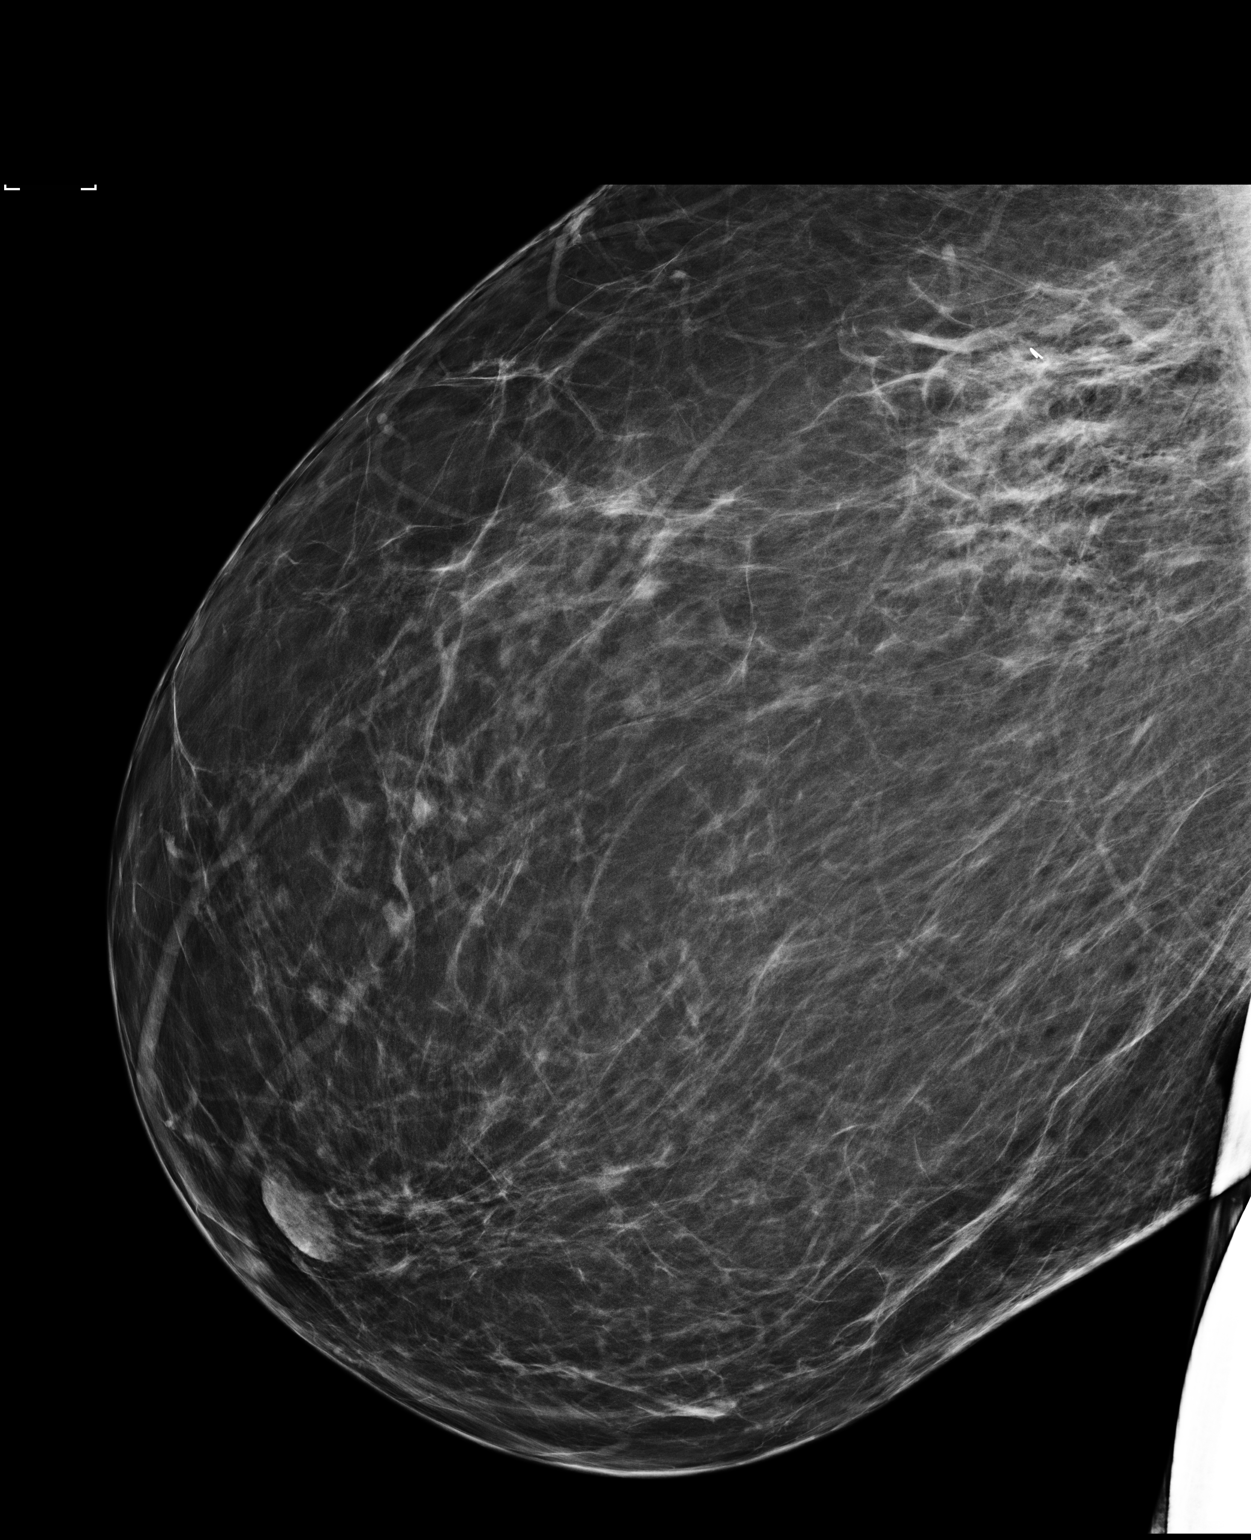

[R CC]
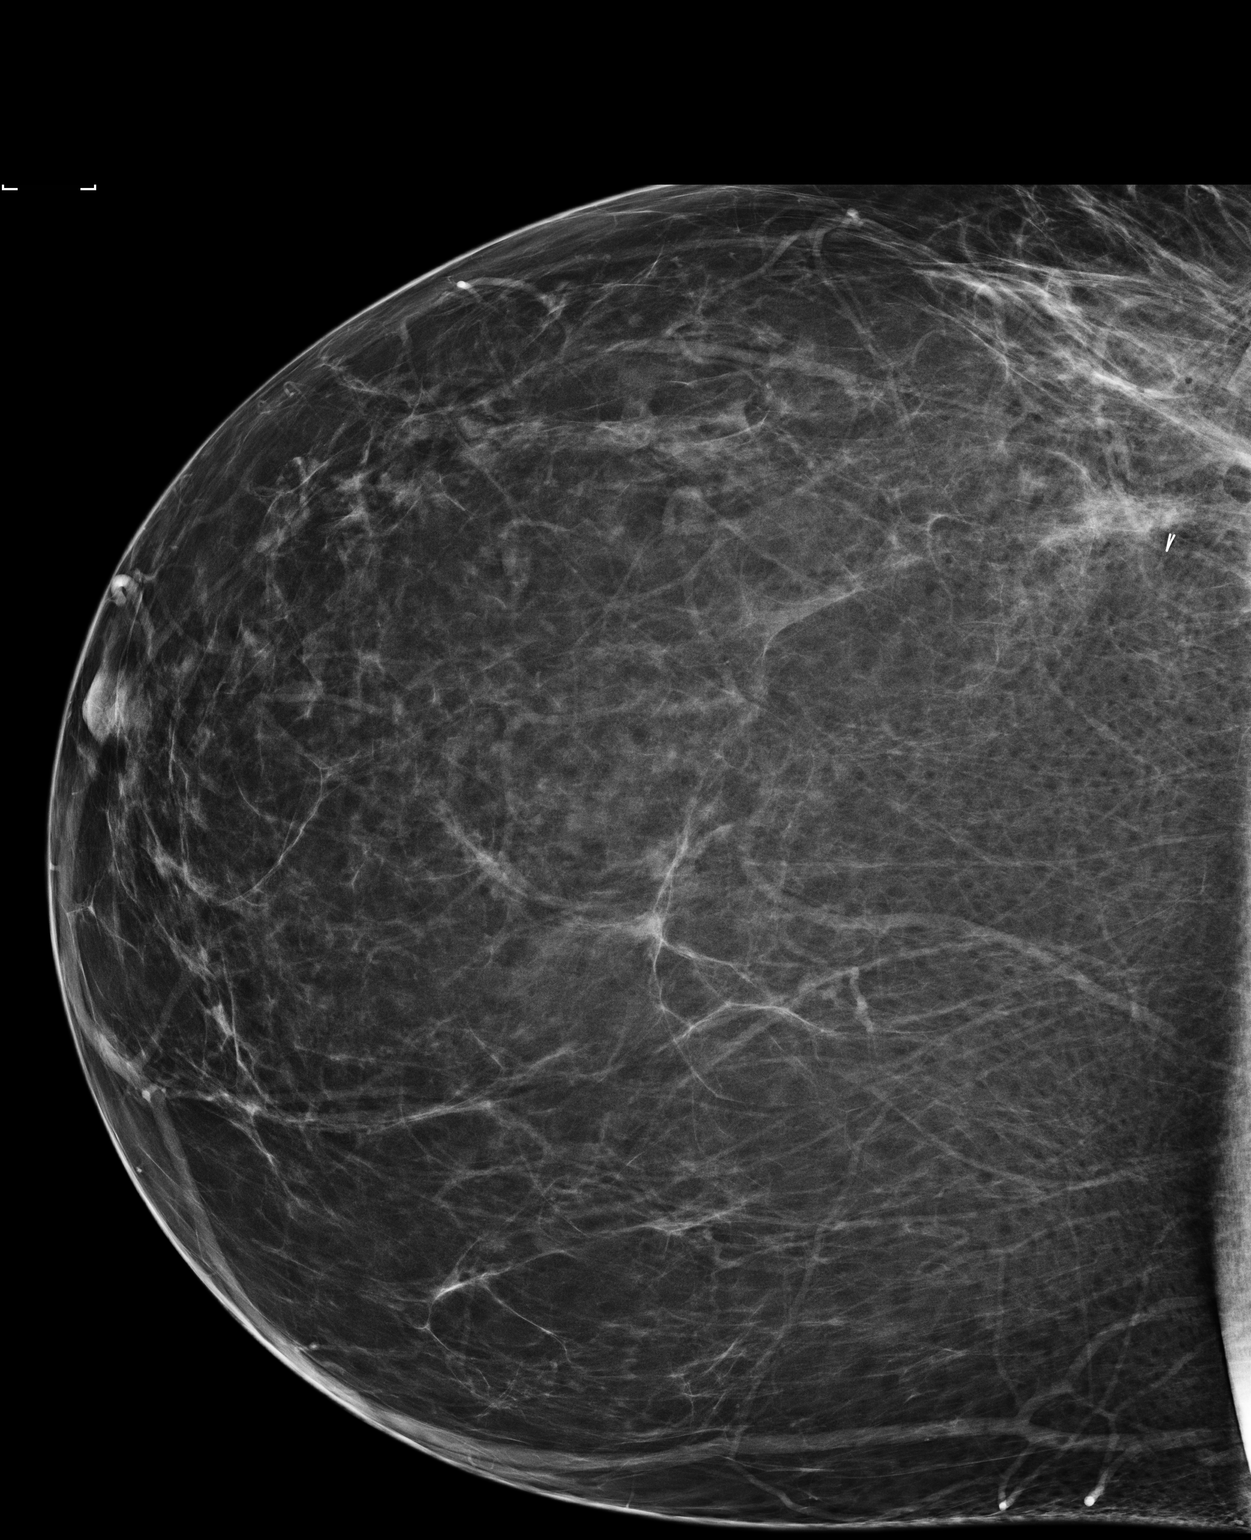

[2 of 2 positions shown; findings below may reference images not displayed]

FINDINGS: Mammographic images were obtained following ultrasound guided biopsy
of the right breast. Mammographic images show there is a ribbon
shaped clip in appropriate position in the upper-outer quadrant of
the right breast.
IMPRESSION: Status post ultrasound-guided core biopsy of the right breast with
pathology pending.

Final Assessment: Post Procedure Mammograms for Marker Placement

## 2019-11-20 ENCOUNTER — Encounter: Payer: Self-pay | Admitting: Hematology and Oncology

## 2019-11-25 DIAGNOSIS — Z20828 Contact with and (suspected) exposure to other viral communicable diseases: Secondary | ICD-10-CM | POA: Diagnosis not present

## 2019-11-29 ENCOUNTER — Inpatient Hospital Stay: Payer: BC Managed Care – PPO | Admitting: Hematology and Oncology

## 2019-12-18 ENCOUNTER — Encounter: Payer: Self-pay | Admitting: Hematology and Oncology

## 2019-12-19 ENCOUNTER — Telehealth: Payer: Self-pay | Admitting: Hematology and Oncology

## 2019-12-19 NOTE — Telephone Encounter (Signed)
Returned patient's phone call regarding rescheduling 01/08 appointment, per patient's request appointment has moved to 01/15.

## 2019-12-20 ENCOUNTER — Ambulatory Visit: Payer: BC Managed Care – PPO | Admitting: Hematology and Oncology

## 2019-12-20 ENCOUNTER — Inpatient Hospital Stay: Payer: BC Managed Care – PPO | Admitting: Hematology and Oncology

## 2019-12-26 NOTE — Progress Notes (Signed)
Patient Care Team: [name], MD as PCP - General (Family Medicine) Fanny Skates, MD as Consulting Physician (General Surgery) Nicholas Lose, MD as Consulting Physician (Hematology and Oncology) Kyung Rudd, MD as Consulting Physician (Radiation Oncology)  DIAGNOSIS:    ICD-10-CM   1. Malignant neoplasm of upper-outer quadrant of right breast in female, estrogen receptor negative ([place])  C50.411    Z17.1     SUMMARY OF ONCOLOGIC HISTORY: Oncology History  Malignant neoplasm of upper-outer quadrant of right breast in female, estrogen receptor negative ([place])  07/13/2018 Initial Diagnosis   Screening detected right breast asymmetry UOQ 10 o'clock position 1.4 cm, axilla negative, biopsy revealed grade 2 IDC triple negative with a Ki-67 of 50% T1c N0 stage Ib AJCC 8   08/06/2018 Genetic Testing   NBN c.2117C>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  The Hereditary Gene Panel offered by [facility] includes sequencing and/or deletion duplication testing of the following 47 genes: APC, ATM, AXIN2, BARD1, BMPR1A, BRCA1, BRCA2, BRIP1, CDH1, CDK4, CDKN2A (p14ARF), CDKN2A (p16INK4a), CHEK2, CTNNA1, DICER1, EPCAM (Deletion/duplication testing only), GREM1 (promoter region deletion/duplication testing only), KIT, MEN1, MLH1, MSH2, MSH3, MSH6, MUTYH, NBN, NF1, NHTL1, PALB2, PDGFRA, PMS2, POLD1, POLE, PTEN, RAD50, RAD51C, RAD51D, SDHB, SDHC, SDHD, SMAD4, SMARCA4. STK11, TP53, TSC1, TSC2, and VHL.  The following genes were evaluated for sequence changes only: SDHA and HOXB13 c.251G>A variant only. The report date is August 06, 2018.   08/07/2018 Surgery   Right lumpectomy: IDC grade 2, 1.5 cm, with intermediate grade DCIS, margins negative, 0/3 lymph nodes negative, triple negative with Ki-67 of 50%, T1 CN 0 stage IB   08/07/2018 Cancer Staging   Staging form: Breast, AJCC 8th Edition - Pathologic stage from 08/07/2018: Stage  IB (pT1c, pN0, cM0, G2, ER-, PR-, HER2-) - Signed by Gardenia Phlegm, NP on 08/22/2018   09/05/2018 - 01/30/2019 Chemotherapy   Adriamycin and Cytoxan x 4 to be followed by weekly Taxol   03/06/2019 - 04/10/2019 Radiation Therapy   Adj XRT     CHIEF COMPLIANT: Surveillance of right breast caner  INTERVAL HISTORY: Jennifer Carroll is a 54 y.o. with above-mentioned history of triple negative right breast cancer treated with lumpectomy, adjuvant chemotherapy, radiation, and who is currently on surveillance.Mammogram on 10/21/19 showed no evidence of malignancy bilaterally. She presents to the clinic today for follow-up.    ALLERGIES:  has No Known Allergies.  MEDICATIONS:  Current Outpatient Medications  Medication Sig Dispense Refill  . BIOTIN PO Take by mouth daily.    . Cyanocobalamin (B-12 PO) Take by mouth daily.    Marland Kitchen ibuprofen (ADVIL,MOTRIN) 200 MG tablet Take 200 mg by mouth every 6 (six) hours as needed. Ibuprofen 200 mg 2 tablets As needed.    Marland Kitchen LOSARTAN POTASSIUM PO Take 50 mg by mouth daily.    . Multiple Vitamin (MULTI-VITAMIN DAILY PO) Take by mouth.    . norethindrone (CAMILA) 0.35 MG tablet Take 1 tablet (0.35 mg total) by mouth daily. 3 Package 3  . nystatin (MYCOSTATIN/NYSTOP) powder Apply topically 4 (four) times daily. On affected area. (Breast rash and toes ) 30 g 1  . Pyridoxine HCl (B-6 PO) Take by mouth daily.    . simvastatin (ZOCOR) 20 MG tablet Take 20 mg by mouth every evening.     No current facility-administered medications for this visit.    PHYSICAL EXAMINATION: ECOG PERFORMANCE STATUS: 1 - Symptomatic but completely ambulatory  Vitals:   12/27/19  0840  BP: (!) 145/93  Pulse: 89  Resp: 18  Temp: 97.8 F (36.6 C)  SpO2: 97%   Filed Weights   12/27/19 0840  Weight: 234 lb 3.2 oz (106.2 kg)    BREAST: No palpable masses or nodules in either right or left breasts. No palpable axillary supraclavicular or infraclavicular adenopathy no breast  tenderness or nipple discharge. (exam performed in the presence of a chaperone)  LABORATORY DATA:  I have reviewed the data as listed CMP Latest Ref Rng & Units 01/30/2019 01/23/2019 01/16/2019  Glucose 70 - 99 mg/dL 101(H) 90 102(H)  BUN 6 - 20 mg/dL _0 Creatinine 0.44 - 1.00 mg/dL 0.84 0.83 0.79  Sodium 135 - 145 mmol/L 141 141 141  Potassium 3.5 - 5.1 mmol/L 4.0 4.0 3.9  Chloride 98 - 111 mmol/L 107 108 110  CO2 22 - 32 mmol/L _1 Calcium 8.9 - 10.3 mg/dL 9.5 9.5 9.4  Total Protein 6.5 - 8.1 g/dL 6.9 6.7 6.7  Total Bilirubin 0.3 - 1.2 mg/dL 0.4 0.4 0.4  Alkaline Phos 38 - 126 U/L 89 79 79  AST 15 - 41 U/L _2 ALT 0 - 44 U/L _3 Lab Results  Component Value Date   WBC 3.1 (L) 01/30/2019   HGB 11.6 (L) 01/30/2019   HCT 34.7 (L) 01/30/2019   MCV 99.1 01/30/2019   PLT 213 01/30/2019   NEUTROABS 1.9 01/30/2019    ASSESSMENT & PLAN:  Malignant neoplasm of upper-outer quadrant of right breast in female, estrogen receptor negative ([place]) 08/07/2018:Right lumpectomy: IDC grade 2, 1.5 cm, with intermediate grade DCIS, margins negative, 0/3 lymph nodes negative, triple negative with Ki-67 of 50%, T1 CN 0 stage IB Patient works at [employer] and can work from home if necessary.  Treatment plan: 1.Adjuvant chemotherapy with dose dense Adriamycin and Cytoxan x4 followed by Taxol weekly x12 completed 01/30/2019 2.Adjuvant radiation therapy started 03/06/2019  UPBEAT clinical trial ([id number]): No adverse effects being on the clinical trial _________________________________________________________________________________________ Surveillance:  1.  10/21/2019: Annual mammograms: No evidence of malignancy in either breast breast density category B 2.  Breast exam 12/27/2019: Benign No role of antiestrogen therapy because she is estrogen receptor negative.  Return to clinic in 1 year for follow-up    No orders of the defined types were placed in this  encounter.  The patient has a good understanding of the overall plan. she agrees with it. she will call with any problems that may develop before the next visit here.  Total time spent: 15 mins including face to face time and time spent for planning, charting and coordination of care  Nicholas Lose, MD 12/27/2019  I, Cloyde Reams Dorshimer, am acting as scribe for Dr. Nicholas Lose.  I have reviewed the above documentation for accuracy and completeness, and I agree with the above.

## 2019-12-27 ENCOUNTER — Inpatient Hospital Stay: Payer: 59 | Attending: Hematology and Oncology | Admitting: Hematology and Oncology

## 2019-12-27 ENCOUNTER — Encounter: Payer: Self-pay | Admitting: Hematology and Oncology

## 2019-12-27 ENCOUNTER — Other Ambulatory Visit: Payer: Self-pay

## 2019-12-27 DIAGNOSIS — Z791 Long term (current) use of non-steroidal anti-inflammatories (NSAID): Secondary | ICD-10-CM | POA: Diagnosis not present

## 2019-12-27 DIAGNOSIS — Z79899 Other long term (current) drug therapy: Secondary | ICD-10-CM | POA: Insufficient documentation

## 2019-12-27 DIAGNOSIS — Z853 Personal history of malignant neoplasm of breast: Secondary | ICD-10-CM | POA: Diagnosis not present

## 2019-12-27 DIAGNOSIS — Z9221 Personal history of antineoplastic chemotherapy: Secondary | ICD-10-CM | POA: Insufficient documentation

## 2019-12-27 DIAGNOSIS — Z171 Estrogen receptor negative status [ER-]: Secondary | ICD-10-CM

## 2019-12-27 DIAGNOSIS — Z923 Personal history of irradiation: Secondary | ICD-10-CM | POA: Diagnosis not present

## 2019-12-27 DIAGNOSIS — C50411 Malignant neoplasm of upper-outer quadrant of right female breast: Secondary | ICD-10-CM | POA: Diagnosis not present

## 2019-12-27 NOTE — Assessment & Plan Note (Signed)
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  Treatment plan: 1.Adjuvant chemotherapy with dose dense Adriamycin and Cytoxan x4 followed by Taxol weekly x12 completed 01/30/2019 2.Adjuvant radiation therapy started 03/06/2019  UPBEAT clinical trial ([id number]): No adverse effects being on the clinical trial _________________________________________________________________________________________ Surveillance:  1.  10/21/2019: Annual mammograms: No evidence of malignancy in either breast breast density category B 2.  Breast exam 12/27/2019: Benign No role of antiestrogen therapy because she is estrogen receptor negative.  Return to clinic in 1 year for follow-up

## 2019-12-30 ENCOUNTER — Telehealth: Payer: Self-pay | Admitting: Hematology and Oncology

## 2019-12-30 NOTE — Telephone Encounter (Signed)
I talk with patient regarding schedule  

## 2020-02-03 ENCOUNTER — Encounter: Payer: Self-pay | Admitting: Adult Health

## 2020-03-17 ENCOUNTER — Encounter: Payer: Self-pay | Admitting: Hematology and Oncology

## 2020-04-07 ENCOUNTER — Encounter: Payer: Self-pay | Admitting: Adult Health

## 2020-04-09 ENCOUNTER — Other Ambulatory Visit: Payer: Self-pay

## 2020-04-09 ENCOUNTER — Ambulatory Visit (HOSPITAL_COMMUNITY)
Admission: RE | Admit: 2020-04-09 | Discharge: 2020-04-09 | Disposition: A | Discharge status: Home / Self Care | Payer: No Typology Code available for payment source | Source: Ambulatory Visit | Attending: Adult Health | Admitting: Adult Health

## 2020-04-09 DIAGNOSIS — Z9011 Acquired absence of right breast and nipple: Secondary | ICD-10-CM | POA: Diagnosis not present

## 2020-04-09 DIAGNOSIS — Z1231 Encounter for screening mammogram for malignant neoplasm of breast: Secondary | ICD-10-CM | POA: Diagnosis present

## 2020-04-09 DIAGNOSIS — Z923 Personal history of irradiation: Secondary | ICD-10-CM | POA: Diagnosis not present

## 2020-04-09 DIAGNOSIS — Z853 Personal history of malignant neoplasm of breast: Secondary | ICD-10-CM | POA: Diagnosis not present

## 2020-04-09 MED ORDER — GADOBUTROL 1 MMOL/ML IV SOLN
10.0000 mL | Freq: Once | INTRAVENOUS | Status: AC | PRN
  Administered 2020-04-09: 10 mL via INTRAVENOUS

## 2020-04-10 ENCOUNTER — Telehealth: Payer: Self-pay

## 2020-04-10 NOTE — Telephone Encounter (Signed)
RN notified patient of MRI results.  All questions answered.  No further needs at this time.

## 2020-04-10 NOTE — Telephone Encounter (Signed)
-----   Message from Gardenia Phlegm, NP sent at 04/10/2020 10:49 AM EDT ----- Please call patient with results, normal ----- Message ----- From: Interface, Rad Results In Sent: 04/09/2020   4:23 PM EDT To: Gardenia Phlegm, NP

## 2020-05-10 NOTE — Telephone Encounter (Signed)
error 

## 2020-08-14 ENCOUNTER — Encounter: Payer: Self-pay | Admitting: Hematology and Oncology

## 2020-08-18 ENCOUNTER — Other Ambulatory Visit: Payer: Self-pay

## 2020-08-18 DIAGNOSIS — C50411 Malignant neoplasm of upper-outer quadrant of right female breast: Secondary | ICD-10-CM

## 2020-09-24 ENCOUNTER — Telehealth: Payer: Self-pay | Admitting: Genetic Counselor

## 2020-09-24 ENCOUNTER — Ambulatory Visit: Payer: Self-pay | Admitting: Genetic Counselor

## 2020-09-24 DIAGNOSIS — C50411 Malignant neoplasm of upper-outer quadrant of right female breast: Secondary | ICD-10-CM

## 2020-09-24 DIAGNOSIS — Z1379 Encounter for other screening for genetic and chromosomal anomalies: Secondary | ICD-10-CM

## 2020-09-24 DIAGNOSIS — Z171 Estrogen receptor negative status [ER-]: Secondary | ICD-10-CM

## 2020-09-24 NOTE — Progress Notes (Signed)
UPDATE: NBN pathogenic mutation is no longer thought to increase the risk for breast cancer.  Re-review of the available evidence indicates that the NBN variant is associated with autosomal recessive NBN-related conditions, but is not definitively known to cause autosomal dominant NBN-related conditions. The updated report date is September 10, 2020.

## 2020-09-24 NOTE — Progress Notes (Signed)
HPI:  Ms. Cavness was previously seen in the [place] clinic due to a personal and family history of cancer and concerns regarding a hereditary predisposition to cancer. Please refer to our prior cancer genetics clinic note for more information regarding our discussion, assessment and recommendations, at the time. Ms. Danh recent genetic test results were disclosed to her, as were recommendations warranted by these results. These results and recommendations are discussed in more detail below.  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  The Hereditary Gene Panel offered by [facility] includes sequencing and/or deletion duplication testing of the following 47 genes: APC, ATM, AXIN2, BARD1, BMPR1A, BRCA1, BRCA2, BRIP1, CDH1, CDK4, CDKN2A (p14ARF), CDKN2A (p16INK4a), CHEK2, CTNNA1, DICER1, EPCAM (Deletion/duplication testing only), GREM1 (promoter region deletion/duplication testing only), KIT, MEN1, MLH1, MSH2, MSH3, MSH6, MUTYH, NBN, NF1, NHTL1, PALB2, PDGFRA, PMS2, POLD1, POLE, PTEN, RAD50, RAD51C, RAD51D, SDHB, SDHC, SDHD, SMAD4, SMARCA4. STK11, TP53, TSC1, TSC2, and VHL.  The following genes were evaluated for sequence changes only: SDHA and HOXB13 c.251G>A variant only. The report date is August 06, 2018.  UPDATE: NBN pathogenic mutation is no longer thought to increase the risk for breast cancer.  Re-review of the available evidence indicates that the NBN variant is associated with autosomal recessive NBN-related conditions,  but is not definitively known to cause autosomal dominant NBN-related conditions. The updated report date is September 10, 2020.   08/07/2018 Surgery   Right lumpectomy: IDC grade 2, 1.5 cm, with intermediate grade DCIS, margins negative, 0/3 lymph nodes negative, triple negative with Ki-67 of 50%, T1 CN 0 stage IB   08/07/2018 Cancer Staging   Staging form: Breast, AJCC 8th Edition - Pathologic stage from 08/07/2018: Stage IB (pT1c, pN0, cM0, G2, ER-, PR-, HER2-) - Signed by Gardenia Phlegm, NP on 08/22/2018   09/05/2018 - 01/30/2019 Chemotherapy   Adriamycin and Cytoxan x 4 to be followed by weekly Taxol   03/06/2019 - 04/10/2019 Radiation Therapy   Adj XRT     FAMILY HISTORY:  We obtained a detailed, 4-generation family history.  Significant diagnoses are listed below: Family History  Problem Relation Age of Onset  . Hypertension Mother   . Kidney disease Mother   . Heart disease Father   . Hypertension Brother   . Kidney failure Maternal Grandfather   . Pancreatic cancer Cousin        dx >50    Ms.Overcashhasa 60 year-old daughter and a 34 year-old son with no history of cancer. Ms. Twyman has 3 sisters who died shortly after birth or as a toddler. Ms. Mcleish has a brother who is 62 with no history of cancer. Ms. Olarte also reports a paternal half-brother who had lung cancer.  He has a daughter.   Ms.Milanes's father:died at 31, no history of cancer Paternal aunts/Uncles:1 paternal aunt died in her 84's with no history of cancer. Paternal cousins:no history of cancer Paternal grandfather:unk, died before patient was born Paternal grandmother:died in her 57's/90's cause unk  Ms.Butikofer's mother:died at 67 due to kidney failure. She had a hysterectomy  in her lifetime.  Maternal Aunts/Uncles:6 maternal aunts/uncles, no history of cancer. Maternal cousins:1 maternal cousin died of pancreatic cancer dx >50 Maternal grandfather:died at 23 due to  kidney disease Maternal grandmother:unk, died before patient was born  Ms.Overcashis unawareof previous family history of genetic testing for hereditary cancer risks. Patient's maternal ancestors are of Caucasian/Germandescent, and paternal ancestors are of Caucasian/Germandescent. There is noreported Ashkenazi Jewish ancestry. There is noknown consanguinity.  GENETIC TEST RESULTS:Amended genetic testing reported out onSeptember 29, 2021through the hereditary cancer panel found onepathogenic mutationsassociated with autosomal recessive NBN-related conditions, but NO pathogenic mutations associated with autosomal dominant cancer syndromes. The Common Hereditary Gene Panel offered by [facility] includes sequencing and/or deletion duplication testing of the following 48 genes: APC, ATM, AXIN2, BARD1, BMPR1A, BRCA1, BRCA2, BRIP1, CDH1, CDK4, CDKN2A (p14ARF), CDKN2A (p16INK4a), CHEK2, CTNNA1, DICER1, EPCAM (Deletion/duplication testing only), GREM1 (promoter region deletion/duplication testing only), KIT, MEN1, MLH1, MSH2, MSH3, MSH6, MUTYH, NBN, NF1, NHTL1, PALB2, PDGFRA, PMS2, POLD1, POLE, PTEN, RAD50, RAD51C, RAD51D, RNF43, SDHB, SDHC, SDHD, SMAD4, SMARCA4. STK11, TP53, TSC1, TSC2, and VHL.  The following genes were evaluated for sequence changes only: SDHA and HOXB13 c.251G>A variant only. The test report has been scanned into EPIC and is located under the Molecular Pathology section of the Results Review tab.  A portion of the result report is included below for reference.     We discussed with Ms. Atilano that because current genetic testing is not perfect, it is possible there may be a gene mutation in one of these genes that current testing cannot detect, but that chance is small.  We also discussed, that there could be another gene that has not yet been discovered, or that we have not yet tested, that is responsible for the cancer diagnoses in the family. It is also possible there is a  hereditary cause for the cancer in the family that Ms. Einstein did not inherit and therefore was not identified in her testing.  Therefore, it is important to remain in touch with cancer genetics in the future so that we can continue to offer Ms. Dessert the most up to date genetic testing.   Genetic testing did identify a variant of uncertain significance (VUS) was identified in the BRCA1 gene called c.77T>C (p.Ile26Thr) .  At this time, it is unknown if this variant is associated with increased cancer risk or if this is a normal finding, but most variants such as this get reclassified to being inconsequential. It should not be used to make medical management decisions. With time, we suspect the lab will determine the significance of this variant, if any. If we do learn more about it, we will try to contact Ms. Dinneen to discuss it further. However, it is important to stay in touch with [place] periodically and keep the address and phone number up to date.  ADDITIONAL GENETIC TESTING: We discussed with Ms. Florance that there are other genes that are associated with increased cancer risk that can be analyzed. Should Ms. Friese wish to pursue additional genetic testing, we are happy to discuss and coordinate this testing, at any time.    CANCER SCREENING RECOMMENDATIONS: Ms. Pardue test result is considered negative (normal).  This means that we have not identified a hereditary cause for her personal and family history of cancer at this time. Most cancers happen by chance and this negative test suggests that her cancer may fall into this category.    While reassuring, this does not definitively rule out a hereditary  predisposition to cancer. It is still possible that there could be genetic mutations that are undetectable by current technology. There could be genetic mutations in genes that have not been tested or identified to increase cancer risk.  Therefore, it is recommended she continue to follow  the cancer management and screening guidelines provided by her oncology and primary healthcare provider.   An individual's cancer risk and medical management are not determined by genetic test results alone. Overall cancer risk assessment incorporates additional factors, including personal medical history, family history, and any available genetic information that may result in a personalized plan for cancer prevention and surveillance  RECOMMENDATIONS FOR FAMILY MEMBERS:  Knowing if an NBN pathogenic variant is present is advantageous.Ms. Medal brother and son have a 50% chance to have inherited this mutation. We recommend they have genetic testing for this same mutation, although her son may need to wait until he becomes an adult.  Identifying the presence of this mutation would allow them tounderstand their risks for subsequent generations to have an NBN-related condition.  Individuals in this family might be at some increased risk of developing cancer, over the general population risk, simply due to the family history of cancer.  We recommended women in this family have a yearly mammogram beginning at age 62, or 54 years younger than the earliest onset of cancer, an annual clinical breast exam, and perform monthly breast self-exams. Women in this family should also have a gynecological exam as recommended by their primary provider. All family members should be referred for colonoscopy starting at age 89.  FOLLOW-UP: Lastly, we discussed with Ms. Warrick that cancer genetics is a rapidly advancing field and it is possible that new genetic tests will be appropriate for her and/or her family members in the future. We encouraged her to remain in contact with cancer genetics on an annual basis so we can update her personal and family histories and let her know of advances in cancer genetics that may benefit this family.   Our contact number was provided. Ms. Para questions were answered to  her satisfaction, and she knows she is welcome to call us at anytime with additional questions or concerns.   Roma Kayser, [place], [facility] Licensed, Certified Genetic Counselor [email or web address]

## 2020-09-24 NOTE — Telephone Encounter (Signed)
Revealed that patient's NBN result has been amended.  Re-review of the available evidence indicates that the NBN variant is associated with autosomal recessive NBN-related conditions, but is not definitively known to cause autosomal dominant NBN-related conditions.

## 2020-10-26 ENCOUNTER — Ambulatory Visit
Admission: RE | Admit: 2020-10-26 | Discharge: 2020-10-26 | Disposition: A | Discharge status: Home / Self Care | Payer: No Typology Code available for payment source | Source: Ambulatory Visit | Attending: Hematology and Oncology | Admitting: Hematology and Oncology

## 2020-10-26 ENCOUNTER — Other Ambulatory Visit: Payer: Self-pay

## 2020-10-26 DIAGNOSIS — Z171 Estrogen receptor negative status [ER-]: Secondary | ICD-10-CM

## 2020-10-26 DIAGNOSIS — C50411 Malignant neoplasm of upper-outer quadrant of right female breast: Secondary | ICD-10-CM

## 2020-12-28 ENCOUNTER — Encounter: Payer: Self-pay | Admitting: Hematology and Oncology

## 2020-12-28 ENCOUNTER — Telehealth: Payer: Self-pay | Admitting: Hematology and Oncology

## 2020-12-28 NOTE — Assessment & Plan Note (Deleted)
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  Treatment plan: 1.Adjuvant chemotherapy with dose dense Adriamycin and Cytoxan x4 followed by Taxol weekly x12completed 01/30/2019 2.Adjuvant radiation therapystarted 03/06/2019  UPBEAT clinical trial ([id number]): No adverse effects being on the clinical trial _________________________________________________________________________________________ Surveillance: 1.  10/26/2020: Annual mammograms: No evidence of malignancy in either breast breast density category B 2.  Breast exam 12/27/2019: Benign No role of antiestrogen therapy because she is estrogen receptor negative.  Return to clinic in 1 year for follow-up

## 2020-12-28 NOTE — Telephone Encounter (Signed)
Rescheduled from 1/18. Called and spoke with pt, confirmed 1/27 appt  Pt just wanted to reschedule

## 2020-12-29 ENCOUNTER — Inpatient Hospital Stay: Payer: No Typology Code available for payment source | Admitting: Hematology and Oncology

## 2021-01-06 NOTE — Progress Notes (Signed)
Patient Care Team: [name], MD as PCP - General (Family Medicine) Fanny Skates, MD as Consulting Physician (General Surgery) Nicholas Lose, MD as Consulting Physician (Hematology and Oncology) Kyung Rudd, MD as Consulting Physician (Radiation Oncology)  DIAGNOSIS:    ICD-10-CM   1. 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  The Hereditary Gene Panel offered by [facility] includes sequencing and/or deletion duplication testing of the following 47 genes: APC, ATM, AXIN2, BARD1, BMPR1A, BRCA1, BRCA2, BRIP1, CDH1, CDK4, CDKN2A (p14ARF), CDKN2A (p16INK4a), CHEK2, CTNNA1, DICER1, EPCAM (Deletion/duplication testing only), GREM1 (promoter region deletion/duplication testing only), KIT, MEN1, MLH1, MSH2, MSH3, MSH6, MUTYH, NBN, NF1, NHTL1, PALB2, PDGFRA, PMS2, POLD1, POLE, PTEN, RAD50, RAD51C, RAD51D, SDHB, SDHC, SDHD, SMAD4, SMARCA4. STK11, TP53, TSC1, TSC2, and VHL.  The following genes were evaluated for sequence changes only: SDHA and HOXB13 c.251G>A variant only. The report date is August 06, 2018.  UPDATE: NBN pathogenic mutation is no longer thought to increase the risk for breast cancer.  Re-review of the available evidence indicates that the NBN variant is associated with autosomal recessive NBN-related conditions, but is not definitively known to cause autosomal dominant NBN-related  conditions. The updated report date is September 10, 2020.   08/07/2018 Surgery   Right lumpectomy: IDC grade 2, 1.5 cm, with intermediate grade DCIS, margins negative, 0/3 lymph nodes negative, triple negative with Ki-67 of 50%, T1 CN 0 stage IB   08/07/2018 Cancer Staging   Staging form: Breast, AJCC 8th Edition - Pathologic stage from 08/07/2018: Stage IB (pT1c, pN0, cM0, G2, ER-, PR-, HER2-) - Signed by Gardenia Phlegm, NP on 08/22/2018   09/05/2018 - 01/30/2019 Chemotherapy   Adriamycin and Cytoxan x 4 to be followed by weekly Taxol   03/06/2019 - 04/10/2019 Radiation Therapy   Adj XRT     CHIEF COMPLIANT: Surveillance of right breast cancer  INTERVAL HISTORY: Jennifer Carroll is a 55 y.o. with above-mentioned history of triple negative right breast cancer treatedwith lumpectomy,adjuvant chemotherapy, radiation, and who is currently on surveillance.Mammogram on 10/26/20 showed no evidence of malignancy bilaterally. She presents to the clinic todayfor follow-up.    She reports no new problems or concerns.  Denies any pain lumps or nodules in the breast.  She does get rash under the breast for which she uses nystatin which seems to help.  She is requesting a refill on that.  She does not have any residual side effects from prior treatment.  Denies any neuropathy.  ALLERGIES:  has No Known Allergies.  MEDICATIONS:  Current Outpatient Medications  Medication Sig Dispense Refill  . BIOTIN PO Take by mouth daily.    . Cyanocobalamin (B-12 PO) Take by mouth daily.    Marland Kitchen ibuprofen (ADVIL,MOTRIN) 200 MG tablet Take 200 mg by mouth every 6 (six) hours as needed. Ibuprofen 200 mg 2 tablets As needed.    Marland Kitchen LOSARTAN POTASSIUM PO Take 50 mg by mouth daily.    Marland Kitchen  Multiple Vitamin (MULTI-VITAMIN DAILY PO) Take by mouth.    . norethindrone (CAMILA) 0.35 MG tablet Take 1 tablet (0.35 mg total) by mouth daily. 3 Package 3  . nystatin (MYCOSTATIN/NYSTOP) powder Apply topically 4 (four) times  daily. On affected area. (Breast rash and toes ) 30 g 1  . Pyridoxine HCl (B-6 PO) Take by mouth daily.    . simvastatin (ZOCOR) 20 MG tablet Take 20 mg by mouth every evening.     No current facility-administered medications for this visit.    PHYSICAL EXAMINATION: ECOG PERFORMANCE STATUS: 1 - Symptomatic but completely ambulatory  Vitals:   01/07/21 0837  BP: 125/86  Pulse: 87  Temp: 97.9 F (36.6 C)  SpO2: 100%   Filed Weights   01/07/21 0837  Weight: 241 lb 6.4 oz (109.5 kg)    BREAST: No palpable masses or nodules in either right or left breasts. No palpable axillary supraclavicular or infraclavicular adenopathy no breast tenderness or nipple discharge. (exam performed in the presence of a chaperone)  LABORATORY DATA:  I have reviewed the data as listed CMP Latest Ref Rng & Units 01/30/2019 01/23/2019 01/16/2019  Glucose 70 - 99 mg/dL 101(H) 90 102(H)  BUN 6 - 20 mg/dL $Remove'13 13 10  'TDFbymP$ Creatinine 0.44 - 1.00 mg/dL 0.84 0.83 0.79  Sodium 135 - 145 mmol/L 141 141 141  Potassium 3.5 - 5.1 mmol/L 4.0 4.0 3.9  Chloride 98 - 111 mmol/L 107 108 110  CO2 22 - 32 mmol/L $RemoveB'25 25 23  'qhdIVOvR$ Calcium 8.9 - 10.3 mg/dL 9.5 9.5 9.4  Total Protein 6.5 - 8.1 g/dL 6.9 6.7 6.7  Total Bilirubin 0.3 - 1.2 mg/dL 0.4 0.4 0.4  Alkaline Phos 38 - 126 U/L 89 79 79  AST 15 - 41 U/L $Remo'21 20 20  'UJABM$ ALT 0 - 44 U/L $Remo'22 21 29    'UPVBV$ Lab Results  Component Value Date   WBC 3.1 (L) 01/30/2019   HGB 11.6 (L) 01/30/2019   HCT 34.7 (L) 01/30/2019   MCV 99.1 01/30/2019   PLT 213 01/30/2019   NEUTROABS 1.9 01/30/2019    ASSESSMENT & PLAN:  Malignant neoplasm of upper-outer quadrant of right breast in female, estrogen receptor negative ([place]) 08/07/2018:Right lumpectomy: IDC grade 2, 1.5 cm, with intermediate grade DCIS, margins negative, 0/3 lymph nodes negative, triple negative with Ki-67 of 50%, T1 CN 0 stage IB Patient works at [employer] and can work from home if necessary.  Treatment plan: 1.Adjuvant  chemotherapy with dose dense Adriamycin and Cytoxan x4 followed by Taxol weekly x12completed 01/30/2019 2.Adjuvant radiation therapystarted 03/06/2019  UPBEAT clinical trial ([id number]): No adverse effects being on the clinical trial _________________________________________________________________________________________ Surveillance: 1.    10/26/2020: Annual mammograms: Benign, breast density category B 2.  Breast exam 01/06/2021: Benign 3.  Breast MRI 04/09/2020: Benign, breast density category B  Rash under the breasts: Uses nystatin. No role of antiestrogen therapy because she is estrogen receptor negative.  Return to clinic in 1 year for follow-up  No orders of the defined types were placed in this encounter.  The patient has a good understanding of the overall plan. she agrees with it. she will call with any problems that may develop before the next visit here.  Total time spent: 20 mins including face to face time and time spent for planning, charting and coordination of care  Nicholas Lose, MD 01/07/2021  I, Cloyde Reams Dorshimer, am acting as scribe for Dr. Nicholas Lose.  I have reviewed the above documentation for  accuracy and completeness, and I agree with the above.       

## 2021-01-06 NOTE — Assessment & Plan Note (Addendum)
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  Treatment plan: 1.Adjuvant chemotherapy with dose dense Adriamycin and Cytoxan x4 followed by Taxol weekly x12completed 01/30/2019 2.Adjuvant radiation therapystarted 03/06/2019  UPBEAT clinical trial ([id number]): No adverse effects being on the clinical trial _________________________________________________________________________________________ Surveillance: 1.    10/26/2020: Annual mammograms: Benign, breast density category B 2.  Breast exam 01/06/2021: Benign 3.  Breast MRI 04/09/2020: Benign, breast density category B No role of antiestrogen therapy because she is estrogen receptor negative.  Return to clinic in 1 year for follow-up

## 2021-01-07 ENCOUNTER — Other Ambulatory Visit: Payer: Self-pay

## 2021-01-07 ENCOUNTER — Inpatient Hospital Stay
Payer: No Typology Code available for payment source | Attending: Hematology and Oncology | Admitting: Hematology and Oncology

## 2021-01-07 ENCOUNTER — Other Ambulatory Visit: Payer: Self-pay | Admitting: *Deleted

## 2021-01-07 ENCOUNTER — Encounter: Payer: Self-pay | Admitting: Hematology and Oncology

## 2021-01-07 DIAGNOSIS — C50411 Malignant neoplasm of upper-outer quadrant of right female breast: Secondary | ICD-10-CM

## 2021-01-07 DIAGNOSIS — Z79899 Other long term (current) drug therapy: Secondary | ICD-10-CM | POA: Insufficient documentation

## 2021-01-07 DIAGNOSIS — Z9221 Personal history of antineoplastic chemotherapy: Secondary | ICD-10-CM | POA: Insufficient documentation

## 2021-01-07 DIAGNOSIS — Z923 Personal history of irradiation: Secondary | ICD-10-CM | POA: Insufficient documentation

## 2021-01-07 DIAGNOSIS — Z171 Estrogen receptor negative status [ER-]: Secondary | ICD-10-CM | POA: Insufficient documentation

## 2021-01-07 DIAGNOSIS — Z853 Personal history of malignant neoplasm of breast: Secondary | ICD-10-CM | POA: Diagnosis present

## 2021-01-07 MED ORDER — B COMPLEX PLUS PO TABS: 1.0000 | ORAL_TABLET | Freq: Every day | ORAL | Status: AC

## 2021-01-07 MED ORDER — LORATADINE 10 MG PO TABS: 10.0000 mg | ORAL_TABLET | Freq: Every day | ORAL | Status: AC

## 2021-01-07 MED ORDER — NYSTATIN 100000 UNIT/GM EX POWD: Freq: Four times a day (QID) | CUTANEOUS | 1 refills | Status: DC

## 2021-03-16 ENCOUNTER — Other Ambulatory Visit: Payer: Self-pay | Admitting: Hematology and Oncology

## 2021-09-29 ENCOUNTER — Encounter: Payer: Self-pay | Admitting: Hematology and Oncology

## 2021-09-29 ENCOUNTER — Other Ambulatory Visit: Payer: Self-pay | Admitting: *Deleted

## 2021-09-29 DIAGNOSIS — C50411 Malignant neoplasm of upper-outer quadrant of right female breast: Secondary | ICD-10-CM

## 2021-09-29 DIAGNOSIS — Z171 Estrogen receptor negative status [ER-]: Secondary | ICD-10-CM

## 2021-11-02 ENCOUNTER — Ambulatory Visit
Admission: RE | Admit: 2021-11-02 | Discharge: 2021-11-02 | Disposition: A | Discharge status: Home / Self Care | Payer: No Typology Code available for payment source | Source: Ambulatory Visit | Attending: Hematology and Oncology | Admitting: Hematology and Oncology

## 2021-11-02 ENCOUNTER — Other Ambulatory Visit: Payer: Self-pay

## 2021-11-02 DIAGNOSIS — C50411 Malignant neoplasm of upper-outer quadrant of right female breast: Secondary | ICD-10-CM

## 2022-01-05 NOTE — Progress Notes (Signed)
Patient Care Team: [name], MD as PCP - General (Family Medicine) Fanny Skates, MD as Consulting Physician (General Surgery) Nicholas Lose, MD as Consulting Physician (Hematology and Oncology) Kyung Rudd, MD as Consulting Physician (Radiation Oncology)  DIAGNOSIS:    ICD-10-CM   1. 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  The Hereditary Gene Panel offered by [facility] includes sequencing and/or deletion duplication testing of the following 47 genes: APC, ATM, AXIN2, BARD1, BMPR1A, BRCA1, BRCA2, BRIP1, CDH1, CDK4, CDKN2A (p14ARF), CDKN2A (p16INK4a), CHEK2, CTNNA1, DICER1, EPCAM (Deletion/duplication testing only), GREM1 (promoter region deletion/duplication testing only), KIT, MEN1, MLH1, MSH2, MSH3, MSH6, MUTYH, NBN, NF1, NHTL1, PALB2, PDGFRA, PMS2, POLD1, POLE, PTEN, RAD50, RAD51C, RAD51D, SDHB, SDHC, SDHD, SMAD4, SMARCA4. STK11, TP53, TSC1, TSC2, and VHL.  The following genes were evaluated for sequence changes only: SDHA and HOXB13 c.251G>A variant only. The report date is August 06, 2018.  UPDATE: NBN pathogenic mutation is no longer thought to increase the risk for breast cancer.  Re-review of the available evidence indicates that the NBN variant is associated with autosomal recessive NBN-related conditions, but is not definitively known to cause autosomal dominant NBN-related  conditions. The updated report date is September 10, 2020.   08/07/2018 Surgery   Right lumpectomy: IDC grade 2, 1.5 cm, with intermediate grade DCIS, margins negative, 0/3 lymph nodes negative, triple negative with Ki-67 of 50%, T1 CN 0 stage IB   08/07/2018 Cancer Staging   Staging form: Breast, AJCC 8th Edition - Pathologic stage from 08/07/2018: Stage IB (pT1c, pN0, cM0, G2, ER-, PR-, HER2-) - Signed by Gardenia Phlegm, NP on 08/22/2018    09/05/2018 - 01/30/2019 Chemotherapy   Adriamycin and Cytoxan x 4 to be followed by weekly Taxol   03/06/2019 - 04/10/2019 Radiation Therapy   Adj XRT     CHIEF COMPLIANT: Follow-up of right breast cancer  INTERVAL HISTORY: Jennifer Carroll is a 56 y.o. with above-mentioned history of triple negative right breast cancer treated with lumpectomy, adjuvant chemotherapy, radiation, and who is currently on surveillance. Mammogram on 11/02/21 showed no evidence of malignancy bilaterally. She presents to the clinic today for follow-up.  Denies any residual side effects from prior chemotherapy.  Denies any neuropathy.  Denies any pain or discomfort in the breast.  Denies any lumps or nodules.  ALLERGIES:  has No Known Allergies.  MEDICATIONS:  Current Outpatient Medications  Medication Sig Dispense Refill   B Complex-Folic Acid (B COMPLEX PLUS) TABS Take 1 tablet by mouth daily.     BIOTIN PO Take by mouth daily.     ibuprofen (ADVIL,MOTRIN) 200 MG tablet Take 200 mg by mouth every 6 (six) hours as needed. Ibuprofen 200 mg 2 tablets As needed.     loratadine (CLARITIN) 10 MG tablet Take 1 tablet (10 mg total) by mouth daily.     LOSARTAN POTASSIUM PO Take 50 mg by mouth daily.  Multiple Vitamin (MULTI-VITAMIN DAILY PO) Take by mouth.     norethindrone (CAMILA) 0.35 MG tablet Take 1 tablet (0.35 mg total) by mouth daily. 3 Package 3   nystatin (MYCOSTATIN/NYSTOP) powder APPLY TOPICALLY 4 (FOUR) TIMES DAILY. ON AFFECTED AREA. (BREAST RASH AND TOES )  30 g 1   simvastatin (ZOCOR) 20 MG tablet Take 20 mg by mouth every evening.     No current facility-administered medications for this visit.    PHYSICAL EXAMINATION: ECOG PERFORMANCE STATUS: 1 - Symptomatic but completely ambulatory  Vitals:   01/07/22 0817  BP: 132/80  Pulse: 81  Resp: 18  Temp: (!) 97.4 F (36.3 C)  SpO2: 94%   Filed Weights   01/07/22 0817  Weight: 249 lb 8 oz (113.2 kg)    BREAST: No palpable masses or nodules in either right or left breasts. No palpable axillary supraclavicular or infraclavicular adenopathy no breast tenderness or nipple discharge. (exam performed in the presence of a chaperone)  LABORATORY DATA:  I have reviewed the data as listed CMP Latest Ref Rng & Units 01/30/2019 01/23/2019 01/16/2019  Glucose 70 - 99 mg/dL 101(H) 90 102(H)  BUN 6 - 20 mg/dL $Remove'13 13 10  'wIIrwva$ Creatinine 0.44 - 1.00 mg/dL 0.84 0.83 0.79  Sodium 135 - 145 mmol/L 141 141 141  Potassium 3.5 - 5.1 mmol/L 4.0 4.0 3.9  Chloride 98 - 111 mmol/L 107 108 110  CO2 22 - 32 mmol/L $RemoveB'25 25 23  'jHryoTKa$ Calcium 8.9 - 10.3 mg/dL 9.5 9.5 9.4  Total Protein 6.5 - 8.1 g/dL 6.9 6.7 6.7  Total Bilirubin 0.3 - 1.2 mg/dL 0.4 0.4 0.4  Alkaline Phos 38 - 126 U/L 89 79 79  AST 15 - 41 U/L $Remo'21 20 20  'CyuLd$ ALT 0 - 44 U/L $Remo'22 21 29    'wcUrE$ Lab Results  Component Value Date   WBC 3.1 (L) 01/30/2019   HGB 11.6 (L) 01/30/2019   HCT 34.7 (L) 01/30/2019   MCV 99.1 01/30/2019   PLT 213 01/30/2019   NEUTROABS 1.9 01/30/2019    ASSESSMENT & PLAN:  Malignant neoplasm of upper-outer quadrant of right breast in female, estrogen receptor negative ([place]) 08/07/2018:Right lumpectomy: IDC grade 2, 1.5 cm, with intermediate grade DCIS, margins negative, 0/3 lymph nodes negative, triple negative with Ki-67 of 50%, T1 CN 0 stage IB Patient works at [employer] and works from home mostly    Treatment plan: 1. Adjuvant chemotherapy with dose dense Adriamycin and Cytoxan x4 followed by Taxol weekly x12 completed 01/30/2019 2.   Adjuvant radiation therapy started 03/06/2019   UPBEAT clinical trial ([id number]): No adverse effects being on the clinical trial _________________________________________________________________________________________ Surveillance:  1.  11/02/2021: Annual mammograms: Benign, breast density category B 2.  Breast exam 01/07/2022: Benign 3.  Breast MRI 04/09/2020: Benign, breast density category B   Rash under the breasts: Uses nystatin. No role of antiestrogen therapy because she is estrogen receptor negative.   Return to clinic in 1 year for follow-up    No orders of the defined types were placed in this encounter.  The patient has a good understanding of the overall plan. she agrees with it. she will call with any problems that may develop before the next visit here.  Total time spent: 20 mins including face to face time and time spent for planning, charting and coordination of care  Rulon Eisenmenger, MD, MPH 01/07/2022  I, Thana Ates, am acting as scribe for Dr. Nicholas Lose.  I have reviewed the  above documentation for accuracy and completeness, and I agree with the above.

## 2022-01-07 ENCOUNTER — Inpatient Hospital Stay
Payer: No Typology Code available for payment source | Attending: Hematology and Oncology | Admitting: Hematology and Oncology

## 2022-01-07 ENCOUNTER — Other Ambulatory Visit: Payer: Self-pay

## 2022-01-07 DIAGNOSIS — Z853 Personal history of malignant neoplasm of breast: Secondary | ICD-10-CM | POA: Insufficient documentation

## 2022-01-07 DIAGNOSIS — Z923 Personal history of irradiation: Secondary | ICD-10-CM | POA: Diagnosis not present

## 2022-01-07 DIAGNOSIS — C50411 Malignant neoplasm of upper-outer quadrant of right female breast: Secondary | ICD-10-CM

## 2022-01-07 DIAGNOSIS — Z171 Estrogen receptor negative status [ER-]: Secondary | ICD-10-CM | POA: Insufficient documentation

## 2022-01-07 DIAGNOSIS — Z79899 Other long term (current) drug therapy: Secondary | ICD-10-CM | POA: Diagnosis not present

## 2022-01-07 DIAGNOSIS — Z9221 Personal history of antineoplastic chemotherapy: Secondary | ICD-10-CM | POA: Diagnosis not present

## 2022-01-07 NOTE — Assessment & Plan Note (Signed)
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  Treatment plan: 1.Adjuvant chemotherapy with dose dense Adriamycin and Cytoxan x4 followed by Taxol weekly x12completed 01/30/2019 2.Adjuvant radiation therapystarted 03/06/2019  UPBEAT clinical trial ([id number]): No adverse effects being on the clinical trial _________________________________________________________________________________________ Surveillance: 1.11/02/2021: Annual mammograms: Benign, breast density category B 2.Breast exam 01/07/2022: Benign 3.  Breast MRI 04/09/2020: Benign, breast density category B  Rash under the breasts: Uses nystatin. No role of antiestrogen therapy because she is estrogen receptor negative.  Return to clinic in 1 year for follow-up

## 2022-06-11 ENCOUNTER — Other Ambulatory Visit: Payer: Self-pay | Admitting: Nurse Practitioner

## 2022-10-25 ENCOUNTER — Other Ambulatory Visit: Payer: Self-pay | Admitting: Hematology and Oncology

## 2022-10-25 DIAGNOSIS — Z1231 Encounter for screening mammogram for malignant neoplasm of breast: Secondary | ICD-10-CM

## 2022-11-25 ENCOUNTER — Ambulatory Visit
Admission: RE | Admit: 2022-11-25 | Discharge: 2022-11-25 | Disposition: A | Discharge status: Home / Self Care | Payer: No Typology Code available for payment source | Source: Ambulatory Visit | Attending: Hematology and Oncology | Admitting: Hematology and Oncology

## 2022-11-25 DIAGNOSIS — Z1231 Encounter for screening mammogram for malignant neoplasm of breast: Secondary | ICD-10-CM

## 2023-01-08 NOTE — Assessment & Plan Note (Signed)
08/07/2018:Right lumpectomy: IDC grade 2, 1.5 cm, with intermediate grade DCIS, margins negative, 0/3 lymph nodes negative, triple negative with Ki-67 of 50%, T1 CN 0 stage IB Patient works at [employer] and works from home mostly    Treatment plan: 1. Adjuvant chemotherapy with dose dense Adriamycin and Cytoxan x4 followed by Taxol weekly x12 completed 01/30/2019 2.  Adjuvant radiation therapy started 03/06/2019   UPBEAT clinical trial ([id number]): No adverse effects being on the clinical trial _________________________________________________________________________________________ Surveillance:  1.  12/08/22: Annual mammograms: Benign, breast density category B 2.  Breast exam 01/09/2023: Benign 3.  Breast MRI 04/09/2020: Benign, breast density category B   Rash under the breasts: Uses nystatin. No role of antiestrogen therapy because she is estrogen receptor negative.   Return to clinic on an as-needed basis.

## 2023-01-09 ENCOUNTER — Inpatient Hospital Stay
Payer: No Typology Code available for payment source | Attending: Hematology and Oncology | Admitting: Hematology and Oncology

## 2023-01-09 ENCOUNTER — Other Ambulatory Visit: Payer: Self-pay

## 2023-01-09 VITALS — BP 149/101 | HR 80 | Temp 97.2°F | Resp 16 | Wt 241.9 lb

## 2023-01-09 DIAGNOSIS — Z9221 Personal history of antineoplastic chemotherapy: Secondary | ICD-10-CM | POA: Insufficient documentation

## 2023-01-09 DIAGNOSIS — Z853 Personal history of malignant neoplasm of breast: Secondary | ICD-10-CM | POA: Diagnosis present

## 2023-01-09 DIAGNOSIS — Z923 Personal history of irradiation: Secondary | ICD-10-CM | POA: Diagnosis not present

## 2023-01-09 DIAGNOSIS — Z171 Estrogen receptor negative status [ER-]: Secondary | ICD-10-CM | POA: Diagnosis not present

## 2023-01-09 DIAGNOSIS — C50411 Malignant neoplasm of upper-outer quadrant of right female breast: Secondary | ICD-10-CM

## 2023-01-09 NOTE — Progress Notes (Signed)
Patient Care Team: [name], MD as PCP - General (Family Medicine) Fanny Skates, MD as Consulting Physician (General Surgery) Nicholas Lose, MD as Consulting Physician (Hematology and Oncology) Kyung Rudd, MD as Consulting Physician (Radiation Oncology)  DIAGNOSIS:  Encounter Diagnosis  Name Primary?   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  The Hereditary Gene Panel offered by [facility] includes sequencing and/or deletion duplication testing of the following 47 genes: APC, ATM, AXIN2, BARD1, BMPR1A, BRCA1, BRCA2, BRIP1, CDH1, CDK4, CDKN2A (p14ARF), CDKN2A (p16INK4a), CHEK2, CTNNA1, DICER1, EPCAM (Deletion/duplication testing only), GREM1 (promoter region deletion/duplication testing only), KIT, MEN1, MLH1, MSH2, MSH3, MSH6, MUTYH, NBN, NF1, NHTL1, PALB2, PDGFRA, PMS2, POLD1, POLE, PTEN, RAD50, RAD51C, RAD51D, SDHB, SDHC, SDHD, SMAD4, SMARCA4. STK11, TP53, TSC1, TSC2, and VHL.  The following genes were evaluated for sequence changes only: SDHA and HOXB13 c.251G>A variant only. The report date is August 06, 2018.  UPDATE: NBN pathogenic mutation is no longer thought to increase the risk for breast cancer.  Re-review of the available evidence indicates that the NBN variant is associated with autosomal recessive NBN-related conditions, but is not definitively known to cause autosomal dominant NBN-related  conditions. The updated report date is September 10, 2020.   08/07/2018 Surgery   Right lumpectomy: IDC grade 2, 1.5 cm, with intermediate grade DCIS, margins negative, 0/3 lymph nodes negative, triple negative with Ki-67 of 50%, T1 CN 0 stage IB   08/07/2018 Cancer Staging   Staging form: Breast, AJCC 8th Edition - Pathologic stage from 08/07/2018: Stage IB (pT1c, pN0, cM0, G2, ER-, PR-, HER2-) - Signed by Gardenia Phlegm, NP on 08/22/2018   09/05/2018 - 01/30/2019 Chemotherapy   Adriamycin and Cytoxan x 4 to be followed by weekly Taxol   03/06/2019 - 04/10/2019 Radiation Therapy   Adj XRT     CHIEF COMPLIANT: Follow-up of right breast cancer   INTERVAL HISTORY: Jennifer Carroll is a 57 y.o. with above-mentioned history of triple negative right breast cancer treated with lumpectomy, adjuvant chemotherapy, radiation, and who is currently on surveillance. She presents to the clinic for a annual follow-up. She reports that her health has been good. She denies any pain or discomfort in the breast, she says she does has an air pocket under the right axilla. Rash also has subsided, she states that she gets every once in a wile. Overall she has no other issues or concerns.     ALLERGIES:  has No Known Allergies.  MEDICATIONS:  Current Outpatient Medications  Medication Sig Dispense Refill   B Complex-Folic Acid (B COMPLEX PLUS) TABS Take 1 tablet by mouth daily.     BIOTIN PO Take by mouth daily.     ibuprofen (ADVIL,MOTRIN) 200 MG tablet Take 200 mg by mouth every 6 (six) hours as needed. Ibuprofen 200 mg 2 tablets As needed.     loratadine (CLARITIN) 10 MG tablet Take 1 tablet (10 mg total) by mouth daily.     LOSARTAN  POTASSIUM PO Take 50 mg by mouth daily.     Multiple Vitamin (MULTI-VITAMIN DAILY PO) Take by mouth.     nystatin (MYCOSTATIN/NYSTOP) powder APPLY TOPICALLY 4 (FOUR) TIMES DAILY. ON AFFECTED AREA. (BREAST RASH AND TOES ) 30 g 1   simvastatin (ZOCOR) 20 MG tablet Take 20  mg by mouth every evening.     norethindrone (CAMILA) 0.35 MG tablet Take 1 tablet (0.35 mg total) by mouth daily. 3 Package 3   No current facility-administered medications for this visit.    PHYSICAL EXAMINATION: ECOG PERFORMANCE STATUS: 1 - Symptomatic but completely ambulatory  Vitals:   01/09/23 0841  BP: (!) 149/101  Pulse: 80  Resp: 16  Temp: (!) 97.2 F (36.2 C)  SpO2: 97%   Filed Weights   01/09/23 0841  Weight: 241 lb 14.4 oz (109.7 kg)    BREAST: No palpable masses or nodules in either right or left breasts. No palpable axillary supraclavicular or infraclavicular adenopathy no breast tenderness or nipple discharge. (exam performed in the presence of a chaperone)  LABORATORY DATA:  I have reviewed the data as listed    Latest Ref Rng & Units 01/30/2019    1:35 PM 01/23/2019    8:00 AM 01/16/2019    8:20 AM  CMP  Glucose 70 - 99 mg/dL 101  90  102   BUN 6 - 20 mg/dL '13  13  10   '$ Creatinine 0.44 - 1.00 mg/dL 0.84  0.83  0.79   Sodium 135 - 145 mmol/L 141  141  141   Potassium 3.5 - 5.1 mmol/L 4.0  4.0  3.9   Chloride 98 - 111 mmol/L 107  108  110   CO2 22 - 32 mmol/L '25  25  23   '$ Calcium 8.9 - 10.3 mg/dL 9.5  9.5  9.4   Total Protein 6.5 - 8.1 g/dL 6.9  6.7  6.7   Total Bilirubin 0.3 - 1.2 mg/dL 0.4  0.4  0.4   Alkaline Phos 38 - 126 U/L 89  79  79   AST 15 - 41 U/L '21  20  20   '$ ALT 0 - 44 U/L '22  21  29     '$ Lab Results  Component Value Date   WBC 3.1 (L) 01/30/2019   HGB 11.6 (L) 01/30/2019   HCT 34.7 (L) 01/30/2019   MCV 99.1 01/30/2019   PLT 213 01/30/2019   NEUTROABS 1.9 01/30/2019    ASSESSMENT & PLAN:  Malignant neoplasm of upper-outer quadrant of right breast in female, estrogen receptor negative ([place]) 08/07/2018:Right lumpectomy: IDC grade 2, 1.5 cm, with intermediate grade DCIS, margins negative, 0/3 lymph nodes negative, triple negative with Ki-67 of 50%, T1 CN 0 stage IB Patient works at [employer] and works from home mostly     Treatment plan: 1. Adjuvant chemotherapy with dose dense Adriamycin and Cytoxan x4 followed by Taxol weekly x12 completed 01/30/2019 2.  Adjuvant radiation therapy started 03/06/2019   UPBEAT clinical trial ([id number]): No adverse effects being on the clinical trial _________________________________________________________________________________________ Surveillance:  1.  12/08/22: Annual mammograms: Benign, breast density category B 2.  Breast exam 01/09/2023: Benign 3.  Breast MRI 04/09/2020: Benign, breast density category B   Rash under the breasts: Uses nystatin. No role of antiestrogen therapy because she is estrogen receptor negative.   Return to clinic on an as-needed basis.    No orders of the defined types were placed in this encounter.  The patient has  a good understanding of the overall plan. she agrees with it. she will call with any problems that may develop before the next visit here. Total time spent: 30 mins including face to face time and time spent for planning, charting and co-ordination of care   Harriette Ohara, MD 01/09/23    I Gardiner Coins am acting as a Education administrator for [employer]  I have reviewed the above documentation for accuracy and completeness, and I agree with the above.

## 2023-11-03 ENCOUNTER — Other Ambulatory Visit: Payer: Self-pay | Admitting: Family Medicine

## 2023-11-03 DIAGNOSIS — Z1231 Encounter for screening mammogram for malignant neoplasm of breast: Secondary | ICD-10-CM

## 2023-12-08 ENCOUNTER — Ambulatory Visit
Admission: RE | Admit: 2023-12-08 | Discharge: 2023-12-08 | Disposition: A | Discharge status: Home / Self Care | Payer: No Typology Code available for payment source | Source: Ambulatory Visit | Attending: Family Medicine | Admitting: Family Medicine

## 2023-12-08 DIAGNOSIS — Z1231 Encounter for screening mammogram for malignant neoplasm of breast: Secondary | ICD-10-CM

## 2024-10-29 ENCOUNTER — Other Ambulatory Visit: Payer: Self-pay | Admitting: Family Medicine

## 2024-10-29 DIAGNOSIS — Z1231 Encounter for screening mammogram for malignant neoplasm of breast: Secondary | ICD-10-CM

## 2024-12-09 ENCOUNTER — Ambulatory Visit
Admission: RE | Admit: 2024-12-09 | Discharge: 2024-12-09 | Disposition: A | Source: Ambulatory Visit | Attending: Family Medicine | Admitting: Family Medicine

## 2024-12-09 DIAGNOSIS — Z1231 Encounter for screening mammogram for malignant neoplasm of breast: Secondary | ICD-10-CM
